# Patient Record
Sex: Male | Born: 1935 | Race: White | Hispanic: No | Marital: Married | Smoking: Former smoker
Health system: Southern US, Community
[De-identification: ages and names within clinical notes are randomized; demographics above are authoritative.]

## PROBLEM LIST (undated history)

## (undated) DIAGNOSIS — R413 Other amnesia: Secondary | ICD-10-CM

## (undated) DIAGNOSIS — N400 Enlarged prostate without lower urinary tract symptoms: Secondary | ICD-10-CM

## (undated) DIAGNOSIS — K648 Other hemorrhoids: Secondary | ICD-10-CM

## (undated) DIAGNOSIS — R51 Headache: Secondary | ICD-10-CM

## (undated) DIAGNOSIS — I499 Cardiac arrhythmia, unspecified: Secondary | ICD-10-CM

## (undated) DIAGNOSIS — G4730 Sleep apnea, unspecified: Secondary | ICD-10-CM

## (undated) DIAGNOSIS — D126 Benign neoplasm of colon, unspecified: Secondary | ICD-10-CM

## (undated) DIAGNOSIS — R519 Headache, unspecified: Secondary | ICD-10-CM

## (undated) DIAGNOSIS — I739 Peripheral vascular disease, unspecified: Secondary | ICD-10-CM

## (undated) DIAGNOSIS — K5790 Diverticulosis of intestine, part unspecified, without perforation or abscess without bleeding: Secondary | ICD-10-CM

## (undated) DIAGNOSIS — I10 Essential (primary) hypertension: Secondary | ICD-10-CM

## (undated) DIAGNOSIS — G2581 Restless legs syndrome: Secondary | ICD-10-CM

## (undated) HISTORY — DX: Other amnesia: R41.3

## (undated) HISTORY — DX: Benign neoplasm of colon, unspecified: D12.6

## (undated) HISTORY — DX: Benign prostatic hyperplasia without lower urinary tract symptoms: N40.0

## (undated) HISTORY — DX: Other hemorrhoids: K64.8

## (undated) HISTORY — PX: CATARACT EXTRACTION W/ INTRAOCULAR LENS  IMPLANT, BILATERAL: SHX1307

## (undated) HISTORY — DX: Restless legs syndrome: G25.81

## (undated) HISTORY — PX: APPENDECTOMY: SHX54

## (undated) HISTORY — DX: Diverticulosis of intestine, part unspecified, without perforation or abscess without bleeding: K57.90

---

## 1999-03-30 ENCOUNTER — Ambulatory Visit (HOSPITAL_COMMUNITY): Admission: RE | Admit: 1999-03-30 | Discharge: 1999-03-30 | Payer: Self-pay | Admitting: *Deleted

## 1999-03-30 HISTORY — PX: CARDIAC CATHETERIZATION: SHX172

## 1999-04-04 ENCOUNTER — Encounter: Payer: Self-pay | Admitting: *Deleted

## 1999-04-04 ENCOUNTER — Ambulatory Visit (HOSPITAL_COMMUNITY): Admission: RE | Admit: 1999-04-04 | Discharge: 1999-04-04 | Payer: Self-pay | Admitting: *Deleted

## 2001-12-26 ENCOUNTER — Encounter: Admission: RE | Admit: 2001-12-26 | Discharge: 2002-03-26 | Payer: Self-pay | Admitting: Internal Medicine

## 2006-10-15 ENCOUNTER — Ambulatory Visit: Payer: Self-pay | Admitting: Gastroenterology

## 2006-10-24 ENCOUNTER — Encounter: Payer: Self-pay | Admitting: Gastroenterology

## 2006-10-24 ENCOUNTER — Ambulatory Visit: Payer: Self-pay | Admitting: Gastroenterology

## 2006-10-24 DIAGNOSIS — D126 Benign neoplasm of colon, unspecified: Secondary | ICD-10-CM

## 2006-10-24 DIAGNOSIS — K648 Other hemorrhoids: Secondary | ICD-10-CM

## 2006-10-24 DIAGNOSIS — K5790 Diverticulosis of intestine, part unspecified, without perforation or abscess without bleeding: Secondary | ICD-10-CM

## 2006-10-24 HISTORY — DX: Benign neoplasm of colon, unspecified: D12.6

## 2006-10-24 HISTORY — DX: Other hemorrhoids: K64.8

## 2006-10-24 HISTORY — DX: Diverticulosis of intestine, part unspecified, without perforation or abscess without bleeding: K57.90

## 2008-02-21 HISTORY — PX: SHOULDER ARTHROSCOPY W/ ROTATOR CUFF REPAIR: SHX2400

## 2008-09-24 ENCOUNTER — Encounter: Admission: RE | Admit: 2008-09-24 | Discharge: 2008-09-24 | Payer: Self-pay | Admitting: Family Medicine

## 2008-11-10 ENCOUNTER — Encounter: Admission: RE | Admit: 2008-11-10 | Discharge: 2009-02-08 | Payer: Self-pay | Admitting: Orthopedic Surgery

## 2010-07-08 NOTE — Cardiovascular Report (Signed)
[facility]  Patient:    Carl Harper, Carl Harper                     MRN: [id number] Proc. Date: 03/30/99 Adm. Date:  [id number] Attending:  Meade Maw A CC:         Redmond Baseman, M.D.                        Cardiac Catheterization  PROCEDURE PERFORMED: 1. Left heart catheterization. 2. Coronary angiography. 3. Single plane ventriculogram.  REFERRING PHYSICIAN:  Dr. Modesto Charon.  INDICATION:  [name] is a 76 year old gentleman with multiple risk factors for coronary artery disease including male, age, tobacco abuse.  A stress Cardiolite was performed.  The SPECT images showed decreased perfusion of the stress and rest images on the inferior wall and decreased uptake in the anterior inferior apical which was felt to be partial redistribution.  The ejection fraction was 58%.  The patient had poor exercise tolerance, and only exercised for 5 minutes and 38 seconds.  Based on these findings it was decided to proceed with a left heart catheterization.  After obtaining written informed consent the patient was brought to the cardiac catheterization lab in the post ictal state.  Preoperative sedation was achieved using IV Versed, IV Fentanyl. The right femoral head was identified using radiographic technique.  A 6 French hemostasis sheath was placed into the right femoral artery using modified Seldinger technique.  Selective coronary angiography was performed using a JL5 and JR4 Judkins catheter.  Single plane ventriculogram was performed using a 6 French pigtail curved catheter.  All catheter exchanges were made over guidewire.  A hemostasis sheath was placed following each catheter exchange.  Following the  procedure the films were reviewed.  There was no identifiable coronary artery disease.  FINDINGS:  The LV pressure was 109/14 AO pressure was 116/66.  Single plane ventriculogram revealed normal wall motion.  There was post  PVC, and MR noted.  Coronary angiography of the left main coronary artery trifurcated into the left anterior descending ramus and circumflex vessel.  There was no significant disease in the left main coronary artery.  LEFT ANTERIOR DESCENDING:  Left anterior descending gave rise to a small D1, moderate D2, a small D3 and a distant apical recurrent branch.  There appears to be some calcium extrinsic to the proximal LAD but none actually intraluminal. There was luminal irregularities in the mid LAD.  RAMUS BRANCH:  The ramus branch ______ as a high obtuse marginal and trifurcated. It was a large vessel, most of the lateral portion of the ventricle.  CIRCUMFLEX VESSEL:  The circumflex vessel was dominant for the posterior circulation and gave rise to a moderate OM1, and no significant disease.  RIGHT CORONARY ARTERY:  The right coronary artery was small and nondominant and had luminal irregularities only.  IMPRESSION: 1. Luminal irregularities. 2. Normal LV gram. 3. Dilated aorta.  RECOMMENDATIONS:  Will be to consider other etiologies for his chest pain including a CT scan of the chest to evaluate the aorta. DD:  03/30/99 TD:  03/30/99 Job: [id number]

## 2010-09-19 DIAGNOSIS — N3943 Post-void dribbling: Secondary | ICD-10-CM | POA: Diagnosis present

## 2011-01-09 HISTORY — PX: OTHER SURGICAL HISTORY: SHX169

## 2011-09-13 ENCOUNTER — Encounter: Payer: Self-pay | Admitting: Gastroenterology

## 2011-10-05 ENCOUNTER — Encounter: Payer: Self-pay | Admitting: Gastroenterology

## 2011-10-25 ENCOUNTER — Encounter (HOSPITAL_COMMUNITY): Payer: Self-pay | Admitting: Respiratory Therapy

## 2011-10-26 ENCOUNTER — Other Ambulatory Visit: Payer: Self-pay | Admitting: Cardiovascular Disease

## 2011-10-31 ENCOUNTER — Telehealth: Payer: Self-pay | Admitting: Gastroenterology

## 2011-11-01 ENCOUNTER — Other Ambulatory Visit: Payer: Self-pay | Admitting: Cardiovascular Disease

## 2011-11-01 ENCOUNTER — Ambulatory Visit
Admission: RE | Admit: 2011-11-01 | Discharge: 2011-11-01 | Disposition: A | Discharge status: Home / Self Care | Payer: Medicare Other | Source: Ambulatory Visit | Attending: Cardiovascular Disease | Admitting: Cardiovascular Disease

## 2011-11-01 DIAGNOSIS — Z01818 Encounter for other preprocedural examination: Secondary | ICD-10-CM

## 2011-11-02 NOTE — Telephone Encounter (Signed)
error 

## 2011-11-03 ENCOUNTER — Other Ambulatory Visit: Payer: Self-pay | Admitting: Cardiovascular Disease

## 2011-11-06 ENCOUNTER — Ambulatory Visit
Admission: RE | Admit: 2011-11-06 | Discharge: 2011-11-06 | Disposition: A | Discharge status: Home / Self Care | Payer: Medicare Other | Source: Ambulatory Visit | Attending: Cardiovascular Disease | Admitting: Cardiovascular Disease

## 2011-11-07 ENCOUNTER — Ambulatory Visit (HOSPITAL_COMMUNITY)
Admission: RE | Admit: 2011-11-07 | Discharge: 2011-11-08 | Disposition: A | Discharge status: Home / Self Care | Payer: Medicare Other | Source: Ambulatory Visit | Attending: Cardiovascular Disease | Admitting: Cardiovascular Disease

## 2011-11-07 ENCOUNTER — Encounter (HOSPITAL_COMMUNITY)
Admission: RE | Disposition: A | Discharge status: Home / Self Care | Payer: Self-pay | Source: Ambulatory Visit | Attending: Cardiovascular Disease

## 2011-11-07 ENCOUNTER — Encounter (HOSPITAL_COMMUNITY): Payer: Self-pay | Admitting: General Practice

## 2011-11-07 DIAGNOSIS — I10 Essential (primary) hypertension: Secondary | ICD-10-CM | POA: Insufficient documentation

## 2011-11-07 DIAGNOSIS — I739 Peripheral vascular disease, unspecified: Secondary | ICD-10-CM | POA: Diagnosis present

## 2011-11-07 DIAGNOSIS — I70219 Atherosclerosis of native arteries of extremities with intermittent claudication, unspecified extremity: Secondary | ICD-10-CM | POA: Insufficient documentation

## 2011-11-07 HISTORY — PX: ILIAC ARTERY STENT: SHX1786

## 2011-11-07 HISTORY — DX: Headache: R51

## 2011-11-07 HISTORY — DX: Peripheral vascular disease, unspecified: I73.9

## 2011-11-07 HISTORY — DX: Essential (primary) hypertension: I10

## 2011-11-07 HISTORY — PX: ATHERECTOMY: SHX5502

## 2011-11-07 HISTORY — DX: Sleep apnea, unspecified: G47.30

## 2011-11-07 HISTORY — PX: LOWER EXTREMITY ANGIOGRAM: SHX5508

## 2011-11-07 HISTORY — DX: Headache, unspecified: R51.9

## 2011-11-07 HISTORY — DX: Cardiac arrhythmia, unspecified: I49.9

## 2011-11-07 HISTORY — PX: PERCUTANEOUS STENT INTERVENTION: SHX5500

## 2011-11-07 LAB — POCT ACTIVATED CLOTTING TIME
Activated Clotting Time: 194 seconds
Activated Clotting Time: 204 seconds
Activated Clotting Time: 174 seconds

## 2011-11-07 SURGERY — ANGIOGRAM, LOWER EXTREMITY
Anesthesia: LOCAL | Laterality: Right

## 2011-11-07 MED ORDER — ASPIRIN 81 MG PO TABS: 81.0000 mg | ORAL_TABLET | Freq: Every day | ORAL | Status: DC

## 2011-11-07 MED ORDER — ASPIRIN 81 MG PO CHEW
324.0000 mg | CHEWABLE_TABLET | ORAL | Status: AC
  Administered 2011-11-07: 324 mg via ORAL
  Filled 2011-11-07: qty 4

## 2011-11-07 MED ORDER — ZOLPIDEM TARTRATE 5 MG PO TABS
5.0000 mg | ORAL_TABLET | Freq: Every day | ORAL | Status: DC
  Administered 2011-11-07: 5 mg via ORAL
  Filled 2011-11-07: qty 1

## 2011-11-07 MED ORDER — METOPROLOL SUCCINATE ER 25 MG PO TB24
25.0000 mg | ORAL_TABLET | Freq: Every day | ORAL | Status: DC
  Filled 2011-11-07 (×2): qty 1

## 2011-11-07 MED ORDER — LOSARTAN POTASSIUM 50 MG PO TABS
50.0000 mg | ORAL_TABLET | Freq: Every day | ORAL | Status: DC
  Filled 2011-11-07 (×2): qty 1

## 2011-11-07 MED ORDER — SODIUM CHLORIDE 0.9 % IV SOLN
INTRAVENOUS | Status: AC
  Administered 2011-11-07: 22:00:00 via INTRAVENOUS

## 2011-11-07 MED ORDER — HEPARIN (PORCINE) IN NACL 2-0.9 UNIT/ML-% IJ SOLN
INTRAMUSCULAR | Status: AC
  Filled 2011-11-07: qty 500

## 2011-11-07 MED ORDER — LIDOCAINE HCL (PF) 1 % IJ SOLN
INTRAMUSCULAR | Status: AC
  Filled 2011-11-07: qty 30

## 2011-11-07 MED ORDER — TAMSULOSIN HCL 0.4 MG PO CAPS
0.4000 mg | ORAL_CAPSULE | Freq: Every day | ORAL | Status: DC
  Filled 2011-11-07 (×2): qty 1

## 2011-11-07 MED ORDER — DIAZEPAM 5 MG PO TABS
5.0000 mg | ORAL_TABLET | ORAL | Status: AC
  Administered 2011-11-07: 5 mg via ORAL
  Filled 2011-11-07: qty 1

## 2011-11-07 MED ORDER — MORPHINE SULFATE 2 MG/ML IJ SOLN: 1.0000 mg | INTRAMUSCULAR | Status: DC | PRN

## 2011-11-07 MED ORDER — ONDANSETRON HCL 4 MG/2ML IJ SOLN: 4.0000 mg | Freq: Four times a day (QID) | INTRAMUSCULAR | Status: DC | PRN

## 2011-11-07 MED ORDER — VERAPAMIL HCL 2.5 MG/ML IV SOLN
INTRAVENOUS | Status: AC
  Filled 2011-11-07: qty 2

## 2011-11-07 MED ORDER — HEPARIN SODIUM (PORCINE) 1000 UNIT/ML IJ SOLN
INTRAMUSCULAR | Status: AC
  Filled 2011-11-07: qty 1

## 2011-11-07 MED ORDER — SODIUM CHLORIDE 0.9 % IV SOLN: INTRAVENOUS | Status: DC

## 2011-11-07 MED ORDER — ACETAMINOPHEN 325 MG PO TABS: 650.0000 mg | ORAL_TABLET | ORAL | Status: DC | PRN

## 2011-11-07 MED ORDER — CLOPIDOGREL BISULFATE 75 MG PO TABS
75.0000 mg | ORAL_TABLET | Freq: Every day | ORAL | Status: DC
  Administered 2011-11-08: 09:00:00 75 mg via ORAL
  Filled 2011-11-07: qty 1

## 2011-11-07 MED ORDER — ASPIRIN EC 325 MG PO TBEC
325.0000 mg | DELAYED_RELEASE_TABLET | Freq: Every day | ORAL | Status: DC
  Filled 2011-11-07: qty 1

## 2011-11-07 MED ORDER — CLOPIDOGREL BISULFATE 300 MG PO TABS
ORAL_TABLET | ORAL | Status: AC
  Filled 2011-11-07: qty 1

## 2011-11-07 MED ORDER — ALPRAZOLAM 0.25 MG PO TABS: 0.5000 mg | ORAL_TABLET | Freq: Every evening | ORAL | Status: DC | PRN

## 2011-11-07 MED ORDER — PRAMIPEXOLE DIHYDROCHLORIDE 0.25 MG PO TABS
0.2500 mg | ORAL_TABLET | Freq: Every day | ORAL | Status: DC
  Filled 2011-11-07 (×2): qty 1

## 2011-11-07 NOTE — Progress Notes (Signed)
Patient called to nurse, bleeding from right groin site. Sitting and eating lunch at the time and felt wet. Pressure held for 20 minutes by Tammy Mink-Hayes and pressure dressing applied. Instructions repeated regarding pressure at groin with coughing or sneezing. Patient noted to be coughing when pressure held. Palpable pulse right dorsalis pedas.

## 2011-11-07 NOTE — Progress Notes (Signed)
Site area: right groin  Site Prior to Removal:  Level 0  Pressure Applied For 20 MINUTES    Minutes Beginning at 1505  Manual:   yes  Patient Status During Pull:  stable  Post Pull Groin Site:  Level 0  Post Pull Instructions Given:  yes  Post Pull Pulses Present:  yes  Dressing Applied:  yes  Comments:  Gauze dressing secured with medipore tape

## 2011-11-07 NOTE — H&P (Signed)
  H & P will be scanned in.  Pt was reexamined and existing H & P reviewed. No changes found.  Runell Gess, MD [facility] 11/07/2011 1:33 PM

## 2011-11-07 NOTE — Op Note (Signed)
Carl Harper is a 77 y.o. male    [id number] LOCATION:  FACILITY: [facility]  PHYSICIAN: Nanetta Batty, M.D. 1934-06-19   DATE OF PROCEDURE:  11/07/2011  DATE OF DISCHARGE:  [facility]  PV Intervention    History obtained from chart review. Carl Harper is a 77 year old married Caucasian male referred by Dr. Susa Griffins for peripheral angiography and potential percutaneous intervention for lifestyle limiting claudication. His problems include treated hypertension. He has no significant CAD. By cath remotely. Dopplers in our office revealed a high-frequency signal at the origin of his right common iliac artery.   PROCEDURE DESCRIPTION:    The patient was brought to the second floor  [place] [facility] lab in the postabsorptive state. He was  premedicated with Valium 5 mg by mouth. His right groin was prepped and shaved in usual sterile fashion. Xylocaine 1% was used  for local anesthesia. A 5 French sheath was inserted into the right common  artery using standard Seldinger technique. The patient received  7500 units  of heparin  intravenously.  A 5 French pigtail catheter was used for abdominal aortography bifemoral runoff using bolus chase digital subtraction step table technique. Visipaque dye was used for the entirety of the case. Retrograde aortic pressure was monitored during the case.    HEMODYNAMICS:    AO SYSTOLIC/AO DIASTOLIC: 145/74   ANGIOGRAPHIC RESULTS:   1: Abdominal aortogram-renal arteries widely patent. The intrarenal double aorta was free of significant atherosclerotic disease.  2: Right lower extremity-80% eccentric exophytic calcified plaque near the origin of the right common iliac artery. There was three-vessel runoff.  3: Left lower extremity-normal three-vessel runoff    IMPRESSION:Carl Harper has a high-grade calcified exophytic plaque near the origin of his right common iliac artery. A 5 French angled  catheter was placed across the lesion and 200 mcg of intra-arterial nitroglycerin was administered demonstrating a 55 mm of mercury pullback gradient. Based on this it was decided to proceed with diamondback orbital rotational atherectomy plus or minus PTA and stenting.  Procedure description: The 5 French sheath was exchanged over wire for a 7 French Bright tip sheath. Through a 5 [place] end call catheter a Viper wire was placed in the suprarenal aorta. Using a 2.5 mm burr was used to perform orbital rotation arthrectomy up to 120,000 rpm's. Angiography revealed an excellent result with some mild residual stenosis. Because of this stenting was performed with a 12 mm in diameter by 4 cm long Smart Nitinol self-expanding stent placed just at the origin of the right common iliac artery and post dilated with a 9 mm x 2 cm balloon at 6 atmospheres resulting in reduction of 80% calcific exophytic plaque in the proximal right common iliac artery to 0% residual without dissection. The patient tolerated the procedure well. The guidewire was removed and the sheath was secured. The sheath will be removed once the ACT falls below 170 pressure will be held on the groin to achieve hemostasis. The patient received 300 mg of by mouth Plavix during the case. He'll be discharged home in the morning and obtain followup Dopplers in our office after chills he is back for a return office visit.   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  Runell Gess MD, [facility] 11/07/2011 2:37 PM

## 2011-11-08 DIAGNOSIS — I739 Peripheral vascular disease, unspecified: Secondary | ICD-10-CM | POA: Diagnosis present

## 2011-11-08 DIAGNOSIS — I10 Essential (primary) hypertension: Secondary | ICD-10-CM | POA: Diagnosis present

## 2011-11-08 LAB — CBC
MCH: 30.2 pg (ref 26.0–34.0)
MCHC: 33.9 g/dL (ref 30.0–36.0)
Platelets: 215 10*3/uL (ref 150–400)
RBC: 4.43 MIL/uL (ref 4.22–5.81)

## 2011-11-08 LAB — BASIC METABOLIC PANEL
BUN: 16 mg/dL (ref 6–23)
Calcium: 8.9 mg/dL (ref 8.4–10.5)
GFR calc Af Amer: 75 mL/min — ABNORMAL LOW (ref >90)
GFR calc non Af Amer: 64 mL/min — ABNORMAL LOW (ref >90)
Glucose, Bld: 107 mg/dL — ABNORMAL HIGH (ref 70–99)
Sodium: 141 mEq/L (ref 135–145)

## 2011-11-08 MED ORDER — ASPIRIN 325 MG PO TBEC: 325.0000 mg | DELAYED_RELEASE_TABLET | Freq: Every day | ORAL | Status: DC

## 2011-11-08 MED ORDER — CLOPIDOGREL BISULFATE 75 MG PO TABS: 75.0000 mg | ORAL_TABLET | Freq: Every day | ORAL | Status: DC

## 2011-11-08 NOTE — Discharge Summary (Signed)
Physician Discharge Summary  Patient ID: ESAW DIEL MRN: [id number] DOB/AGE: [birth year] 77 y.o.  Admit date: 11/07/2011 Discharge date: 11/08/2011  Admission Diagnoses:  Lifestyle limiting claudication  Discharge Diagnoses:  Active Problems:  PAD (peripheral artery disease)  HTN (hypertension)  Claudication   Discharged Condition: stable  Hospital Course:   [name] is a 77 year old married Caucasian male referred by Dr. Susa Griffins for peripheral angiography and potential percutaneous intervention for lifestyle limiting claudication. His problems include treated hypertension. He has no significant CAD by cath remotely. Dopplers in our office revealed a high-frequency signal at the origin of his right common iliac artery.  He present for PV angiogram which resulted in successful diamondback orbital rotational atherectomy, PTA and stenting of a calcified exophytic proximal right common iliac artery stenosis.  The patient was seen by Dr. Allyson Sabal and felt to be stable for DC home.  Follow-up with Dr. Alanda Amass and lower extremity doppelrs.   Consults: None  Significant Diagnostic Studies:  PV angiogram and stenting PROCEDURE DESCRIPTION:  The patient was brought to the second floor  [place] [facility] lab in the postabsorptive state. He was  premedicated with Valium 5 mg by mouth. His right groin  was prepped and shaved in usual sterile fashion. Xylocaine 1% was used  for local anesthesia. A 5 French sheath was inserted into the right common  artery using standard Seldinger technique. The patient received  7500 units of heparin intravenously. A 5 French pigtail catheter was used for abdominal aortography bifemoral runoff using bolus chase digital subtraction step table technique. Visipaque dye was used for the entirety of the case. Retrograde aortic pressure was monitored during the case.  HEMODYNAMICS:  AO SYSTOLIC/AO DIASTOLIC: 145/74  ANGIOGRAPHIC RESULTS:    1: Abdominal aortogram-renal arteries widely patent. The intrarenal double aorta was free of significant atherosclerotic disease.  2: Right lower extremity-80% eccentric exophytic calcified plaque near the origin of the right common iliac artery. There was three-vessel runoff.  3: Left lower extremity-normal three-vessel runoff  IMPRESSION:Carl Harper has a high-grade calcified exophytic plaque near the origin of his right common iliac artery. A 5 French angled catheter was placed across the lesion and 200 mcg of intra-arterial nitroglycerin was administered demonstrating a 55 mm of mercury pullback gradient. Based on this it was decided to proceed with diamondback orbital rotational atherectomy plus or minus PTA and stenting.  Procedure description: The 5 French sheath was exchanged over wire for a 7 French Bright tip sheath. Through a 5 [place] end call catheter a Viper wire was placed in the suprarenal aorta. Using a 2.5 mm burr was used to perform orbital rotation arthrectomy up to 120,000 rpm's. Angiography revealed an excellent result with some mild residual stenosis. Because of this stenting was performed with a 12 mm in diameter by 4 cm long Smart Nitinol self-expanding stent placed just at the origin of the right common iliac artery and post dilated with a 9 mm x 2 cm balloon at 6 atmospheres resulting in reduction of 80% calcific exophytic plaque in the proximal right common iliac artery to 0% residual without dissection. The patient tolerated the procedure well. The guidewire was removed and the sheath was secured. The sheath will be removed once the ACT falls below 170 pressure will be held on the groin to achieve hemostasis. The patient received 300 mg of by mouth Plavix during the case. He'll be discharged home in the morning and obtain followup Dopplers in our office after chills he  is back for a return office visit.  Final impression: Successful diamondback orbital rotational atherectomy, PTA  and stenting of a calcified exophytic proximal right common iliac artery stenosis for lifestyle limiting claudication.  Runell Gess MD, [facility]  BMET    Component Value Date/Time   NA 141 11/08/2011 0420   K 3.6 11/08/2011 0420   CL 107 11/08/2011 0420   CO2 25 11/08/2011 0420   GLUCOSE 107* 11/08/2011 0420   BUN 16 11/08/2011 0420   CREATININE 1.09 11/08/2011 0420   CALCIUM 8.9 11/08/2011 0420   GFRNONAA 64* 11/08/2011 0420   GFRAA 75* 11/08/2011 0420   CBC    Component Value Date/Time   WBC 7.9 11/08/2011 0420   RBC 4.43 11/08/2011 0420   HGB 13.4 11/08/2011 0420   HCT 39.5 11/08/2011 0420   PLT 215 11/08/2011 0420   MCV 89.2 11/08/2011 0420   MCH 30.2 11/08/2011 0420   MCHC 33.9 11/08/2011 0420   RDW 12.8 11/08/2011 0420   Treatments: See procedure note.  Discharge Exam: Blood pressure 130/71, pulse 59, temperature 97.5 F (36.4 C), temperature source Oral, resp. rate 18, height 5\' 10"  (1.778 m), weight 81.6 kg (179 lb 14.3 oz), SpO2 96.00%.   Disposition:       Discharge Orders    Future Appointments: Provider: Department: Dept Phone: Center:   11/13/2011 1:30 PM [facility] [phone] [facility]   11/27/2011 10:00 AM Meryl Dare, MD,FACG [facility] [phone] [facility]     Future Orders Please Complete By Expires   Diet - low sodium heart healthy      Increase activity slowly      Discharge instructions      Comments:   No Lifting more than a gallon of milk or driving for three days.   Call MD for:  redness, tenderness, or signs of infection (pain, swelling, redness, odor or green/yellow discharge around incision site)          Medication List     As of 11/08/2011 11:30 AM    STOP taking these medications         aspirin 81 MG tablet      TAKE these medications         ALPRAZolam 0.5 MG tablet   Commonly known as: XANAX   Take 0.5 mg by mouth at bedtime as needed. For sleep      aspirin 325 MG EC tablet   Take  1 tablet (325 mg total) by mouth daily.      clopidogrel 75 MG tablet   Commonly known as: PLAVIX   Take 1 tablet (75 mg total) by mouth daily with breakfast.      losartan 50 MG tablet   Commonly known as: COZAAR   Take 50 mg by mouth daily.      metoprolol succinate 25 MG 24 hr tablet   Commonly known as: TOPROL-XL   Take 25 mg by mouth daily.      pramipexole 0.25 MG tablet   Commonly known as: MIRAPEX   Take 0.25 mg by mouth daily.      Tamsulosin HCl 0.4 MG Caps   Commonly known as: FLOMAX   Take 0.4 mg by mouth daily after supper.      zolpidem 12.5 MG CR tablet   Commonly known as: AMBIEN CR   Take 12.5 mg by mouth at bedtime as needed. For sleep        Follow-up Information    Follow up  with Governor Rooks, MD. (Our office will call with appt dates and times.)    Contact information:   [address]  [place] [address] [phone]          Signed: Wilburt Harper 11/08/2011, 11:30 AM

## 2011-11-08 NOTE — Progress Notes (Signed)
[facility]  Subjective: No complaints.  Objective: Vital signs in last 24 hours: Temp:  [97.5 F (36.4 C)-98.5 F (36.9 C)] 97.5 F (36.4 C) (09/18 0822) Pulse Rate:  [37-142] 59  (09/18 0822) Resp:  [11-23] 18  (09/18 0822) BP: (130-162)/(51-84) 130/71 mmHg (09/18 0822) SpO2:  [95 %-100 %] 96 % (09/18 0822) Weight:  [81.6 kg (179 lb 14.3 oz)] 81.6 kg (179 lb 14.3 oz) (09/18 0015)    Intake/Output from previous day: 09/17 0701 - 09/18 0700 In: 765 [P.O.:240; I.V.:525] Out: 2025 [Urine:2025] Intake/Output this shift:    Medications Current Facility-Administered Medications  Medication Dose Route Frequency Provider Last Rate Last Dose  . 0.9 %  sodium chloride infusion   Intravenous Continuous Carl Gess, MD      . acetaminophen (TYLENOL) tablet 650 mg  650 mg Oral Q4H PRN Carl Gess, MD      . ALPRAZolam Prudy Feeler) tablet 0.5 mg  0.5 mg Oral QHS PRN Carl Gess, MD      . aspirin chewable tablet 324 mg  324 mg Oral Pre-Cath Carl Gess, MD   324 mg at 11/07/11 0931  . aspirin EC tablet 325 mg  325 mg Oral Daily Carl Gess, MD      . clopidogrel (PLAVIX) 300 MG tablet           . clopidogrel (PLAVIX) tablet 75 mg  75 mg Oral Q breakfast Carl Gess, MD   75 mg at 11/08/11 0850  . heparin 1000 UNIT/ML injection           . heparin 2-0.9 UNIT/ML-% infusion           . lidocaine (XYLOCAINE) 1 % injection           . losartan (COZAAR) tablet 50 mg  50 mg Oral Daily Carl Gess, MD      . metoprolol succinate (TOPROL-XL) 24 hr tablet 25 mg  25 mg Oral Daily Carl Gess, MD      . morphine 2 MG/ML injection 1 mg  1 mg Intravenous Q1H PRN Carl Gess, MD      . ondansetron [facility]) injection 4 mg  4 mg Intravenous Q6H PRN Carl Gess, MD      . pramipexole (MIRAPEX) tablet 0.25 mg  0.25 mg Oral Daily Carl Gess, MD      . Tamsulosin HCl (FLOMAX) capsule 0.4 mg  0.4 mg Oral QPC supper Carl Gess, MD      . verapamil (ISOPTIN) 2.5 MG/ML injection           . zolpidem (AMBIEN) tablet 5 mg  5 mg Oral QHS Carl Gess, MD   5 mg at 11/07/11 2200  . DISCONTD: 0.9 %  sodium chloride infusion   Intravenous Continuous Carl Gess, MD      . DISCONTD: aspirin tablet 81 mg  81 mg Oral Daily Carl Gess, MD        PE: General appearance: alert, cooperative and no distress Lungs: clear to auscultation bilaterally Heart: regular rate and rhythm, S1, S2 normal, no murmur, click, rub or gallop Extremities: No LEE Pulses: 2+ and symmetric 1+ left DP, 2+ left DP Neurologic: Grossly normal right groin is nontender, no hematoma or ecchymosis.  Lab Results:   [facility] 11/08/11 0420  WBC 7.9  HGB 13.4  HCT 39.5  PLT 215   BMET  Basename 11/08/11 0420  NA 141  K 3.6  CL 107  CO2 25  GLUCOSE 107*  BUN 16  CREATININE 1.09  CALCIUM 8.9   Studies/Results: PROCEDURE DESCRIPTION:  The patient was brought to the second floor  [place] [facility] cath lab in the postabsorptive state. He was  premedicated with Valium 5 mg by mouth. His right groin  was prepped and shaved in usual sterile fashion. Xylocaine 1% was used  for local anesthesia. A 5 French sheath was inserted into the right common  artery using standard Seldinger technique. The patient received  7500 units of heparin intravenously. A 5 French pigtail catheter was used for abdominal aortography bifemoral runoff using bolus chase digital subtraction step table technique. Visipaque dye was used for the entirety of the case. Retrograde aortic pressure was monitored during the case.  HEMODYNAMICS:  AO SYSTOLIC/AO DIASTOLIC: 145/74  ANGIOGRAPHIC RESULTS:  1: Abdominal aortogram-renal arteries widely patent. The intrarenal double aorta was free of significant atherosclerotic disease.  2: Right lower extremity-80% eccentric exophytic calcified plaque near the origin of the right common iliac artery. There was  three-vessel runoff.  3: Left lower extremity-normal three-vessel runoff  IMPRESSION:Carl Harper has a high-grade calcified exophytic plaque near the origin of his right common iliac artery. A 5 French angled catheter was placed across the lesion and 200 mcg of intra-arterial nitroglycerin was administered demonstrating a 55 mm of mercury pullback gradient. Based on this it was decided to proceed with diamondback orbital rotational atherectomy plus or minus PTA and stenting.  Procedure description: The 5 French sheath was exchanged over wire for a 7 French Bright tip sheath. Through a 5 [place] end call catheter a Viper wire was placed in the suprarenal aorta. Using a 2.5 mm burr was used to perform orbital rotation arthrectomy up to 120,000 rpm's. Angiography revealed an excellent result with some mild residual stenosis. Because of this stenting was performed with a 12 mm in diameter by 4 cm long Smart Nitinol self-expanding stent placed just at the origin of the right common iliac artery and post dilated with a 9 mm x 2 cm balloon at 6 atmospheres resulting in reduction of 80% calcific exophytic plaque in the proximal right common iliac artery to 0% residual without dissection. The patient tolerated the procedure well. The guidewire was removed and the sheath was secured. The sheath will be removed once the ACT falls below 170 pressure will be held on the groin to achieve hemostasis. The patient received 300 mg of by mouth Plavix during the case. He'll be discharged home in the morning and obtain followup Dopplers in our office after chills he is back for a return office visit.  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  Carl Gess MD, [facility]    Assessment/Plan  Active Problems:  PAD (peripheral artery disease)  HTN (hypertension)  Plan:  S/P  Successful diamondback orbital rotational atherectomy, PTA and stenting of a calcified exophytic proximal right common iliac artery stenosis.  Vital signs are stable.  Follow up [facility] dopplers and with Dr. Alanda Amass.    LOS: 1 day    Harper, Carl 11/08/2011 10:58 AM   Agree with note written by Jones Skene PAC  Groin OK. S/P DB atherectomy, PTA/Stent RCIA. OK for D/C home. [facility] then ROV with me 1-2 weeks.  Carl Harper 11/08/2011 12:04 PM

## 2011-11-27 ENCOUNTER — Encounter: Payer: Self-pay | Admitting: Gastroenterology

## 2012-02-27 ENCOUNTER — Encounter: Payer: Self-pay | Admitting: *Deleted

## 2012-02-28 ENCOUNTER — Ambulatory Visit (INDEPENDENT_AMBULATORY_CARE_PROVIDER_SITE_OTHER): Payer: Medicare Other | Admitting: Physician Assistant

## 2012-02-28 ENCOUNTER — Encounter: Payer: Self-pay | Admitting: Physician Assistant

## 2012-02-28 VITALS — BP 124/60 | HR 76 | Ht 67.75 in | Wt 180.5 lb

## 2012-02-28 DIAGNOSIS — Z860101 Personal history of adenomatous and serrated colon polyps: Secondary | ICD-10-CM

## 2012-02-28 DIAGNOSIS — Z7901 Long term (current) use of anticoagulants: Secondary | ICD-10-CM

## 2012-02-28 DIAGNOSIS — Z86010 Personal history of colonic polyps: Secondary | ICD-10-CM

## 2012-02-28 MED ORDER — MOVIPREP 100 G PO SOLR: 1.0000 | Freq: Once | ORAL | Status: AC

## 2012-02-28 NOTE — Patient Instructions (Addendum)
We sent a prescription to [facility] for the Moviprep, the prep you will drink before the colonsocopy.  We will contact you once we hear from Dr. Nanetta Batty at [facility] regarding the Plavix medication.  You have been scheduled for a colonoscopy with propofol. Please follow written instructions given to you at your visit today.  Please pick up your prep kit at the pharmacy within the next 1-3 days. If you use inhalers (even only as needed) or a CPAP machine, please bring them with you on the day of your procedure.

## 2012-02-28 NOTE — Progress Notes (Signed)
Subjective:    Patient ID: Carl Harper, male    DOB: November 04, 1934, 78 y.o.   MRN: [id number]  HPI Carl Harper is a pleasant 78 year old white male referred by his primary care physician, Dr. Everlene Other to consider followup colonoscopy. Patient is known to Dr. Damita Lack and had last colonoscopy done in September of 2008. At that time he was found to have 2 polyps 4 mm or less left-sided diverticulosis and internal hemorrhoids. Path on the polyps one was adenomatous, one hyperplastic. He was recommended for 5 year followup. He currently has no significant GI complaints, he says he does have chronic problems with constipation and uses MiraLax 2 or 3 times weekly and says that this works fine. He has not noted any changes in his bowel habits no melena or hematochezia and denies any abdominal pain. He does have history of claudication and underwent an atherectomy, PTA and stent placement to the right common iliac artery in September of 2013 her Dr. Allyson Sabal. He is now on a  baby aspirin and Plavix.    Review of Systems  Constitutional: Negative.   HENT: Negative.   Eyes: Negative.   Respiratory: Negative.   Gastrointestinal: Positive for constipation.  Genitourinary: Negative.   Musculoskeletal: Positive for gait problem.  Skin: Negative.   Neurological: Negative.   Hematological: Bruises/bleeds easily.  Psychiatric/Behavioral: Negative.    Outpatient Prescriptions Prior to Visit  Medication Sig Dispense Refill  . clopidogrel (PLAVIX) 75 MG tablet Take 1 tablet (75 mg total) by mouth daily with breakfast.  30 tablet  11  . losartan (COZAAR) 50 MG tablet Take 50 mg by mouth daily.      . pramipexole (MIRAPEX) 0.25 MG tablet Take 0.25 mg by mouth as needed.       . Tamsulosin HCl (FLOMAX) 0.4 MG CAPS Take 0.4 mg by mouth daily after supper.      . zolpidem (AMBIEN CR) 12.5 MG CR tablet Take 12.5 mg by mouth at bedtime as needed. For sleep      . [DISCONTINUED] ALPRAZolam (XANAX) 0.5 MG tablet Take 0.5 mg by  mouth at bedtime as needed. For sleep      . [DISCONTINUED] aspirin EC 325 MG EC tablet Take 1 tablet (325 mg total) by mouth daily.  30 tablet    . [DISCONTINUED] metoprolol succinate (TOPROL-XL) 25 MG 24 hr tablet Take 25 mg by mouth daily.       Last reviewed on 02/28/2012  2:09 PM by Sammuel Cooper, PA     No Known Allergies Patient Active Problem List  Diagnosis  . PAD (peripheral artery disease)  . HTN (hypertension)  . Claudication  . Hx of adenomatous colonic polyps   History  Substance Use Topics  . Smoking status: Former Smoker -- 1.0 packs/day for 55 years    Types: Cigarettes    Quit date: 02/20/2006  . Smokeless tobacco: Never Used  . Alcohol Use: 8.4 oz/week    14 Glasses of wine per week     Comment: 11/07/2011 "couple drinks of wine q night"   History reviewed. No pertinent family history.  Objective:   Physical Exam well-developed older white male in no acute distress, pleasant blood pressure 124/60 pulse 76 height 5 foot 7 weight 180. HEENT; nontraumatic normocephalic EOMI PERRLA sclera anicteric,Neck; Supple no JVD, Cardiovascular; regular rate and rhythm with S1-S2 no murmur or gallop, Pulmonary; clear bilaterally, Abdomen; soft nontender nondistended no palpable mass or hepatosplenomegaly bowel sounds are present, Rectal; exam not done, Extremities;  no clubbing cyanosis or edema skin warm and dry, Psych; mood and affect normal and appropriate.        Assessment & Plan:  #16 78 year old male with history of adenomatous colon polyp; last colonoscopy September 2008, and due for followup. #2 recent right common iliac atherectomy and stent placement-on chronic Plavix and aspirin #3 hypertension  Plan; Patient is due for followup colonoscopy and will tentatively schedule with Dr. Russella Dar. Procedure reviewed in detail with the patient and he is agreeable to proceed. Given that he had a recent stent placement cardiology may prefer for him to wait one year prior to  coming off of Plavix, and this was discussed with patient as well. We will obtain recommendation from Dr. Allyson Sabal, and then plan followup colonoscopy accordingly.

## 2012-02-28 NOTE — Progress Notes (Signed)
Reviewed and agree with management plan. Await Dr. Hazle Coca recommendation. If Dr. Allyson Sabal recommends to wait until after 1 full year of Plavix before holding for 5 days for this elective surveillance colonoscopy, will reschedule for 11/2012.   Venita Lick. Russella Dar, MD [facility]

## 2012-02-29 ENCOUNTER — Telehealth: Payer: Self-pay | Admitting: *Deleted

## 2012-02-29 NOTE — Telephone Encounter (Signed)
Called and spoke to [place] [facility]Coumadin clinic there.  She said she takes care of coumadin patients only but would be glad to look for this fax I sent and see that Dr. Alanda Amass gets it.

## 2012-02-29 NOTE — Telephone Encounter (Signed)
I faxed a Plavix letter to [facility] attention to Dr. Nanetta Batty. My l etter also included the patient had an Iliac stent place in September 2013.  I was told he is actually taken care of by Dr. Alanda Amass.  I called today the 9th and spoke to [facility] and she will see that Dr. Alanda Amass gets my fax. I advised I need it back in a reasonable amount of time before the procedure so I can give the patient their medication instructions/Plavix.

## 2012-03-13 ENCOUNTER — Telehealth: Payer: Self-pay | Admitting: *Deleted

## 2012-03-13 NOTE — Telephone Encounter (Signed)
I called and spoke to the patient's wife, [name].  I advised her that her husband's colonoscopy procedure is scheduled for 04-11-2012.  I advised we heard from [facility] , Dr. Alanda Amass said that Carl Harper, the patient, can hold the Plavix for 5 days prior to the procedure, (on 2-15 ) and resume it the day after the procedure ( 04-12-2012).  She understood this and will convey this information to her husband.  I told her if they have any questions regarding the Plavix to call [facility] and speak to his cardiologist.

## 2012-04-11 ENCOUNTER — Ambulatory Visit (AMBULATORY_SURGERY_CENTER): Payer: Medicare Other | Admitting: Gastroenterology

## 2012-04-11 ENCOUNTER — Encounter: Payer: Self-pay | Admitting: Gastroenterology

## 2012-04-11 VITALS — BP 115/54 | HR 56 | Temp 96.5°F | Resp 16 | Ht 67.0 in | Wt 180.0 lb

## 2012-04-11 DIAGNOSIS — Z1211 Encounter for screening for malignant neoplasm of colon: Secondary | ICD-10-CM

## 2012-04-11 DIAGNOSIS — Z860100 Personal history of colon polyps, unspecified: Secondary | ICD-10-CM

## 2012-04-11 MED ORDER — SODIUM CHLORIDE 0.9 % IV SOLN: 500.0000 mL | INTRAVENOUS | Status: DC

## 2012-04-11 NOTE — Patient Instructions (Addendum)
Findings:  Diverticulosis, Internal Hemorrhoids Recommendations:  High Fiber Diet and liberal fluid intake, Resume plavix today  YOU HAD AN ENDOSCOPIC PROCEDURE TODAY AT THE [facility]: Refer to the procedure report that was given to you for any specific questions about what was found during the examination.  If the procedure report does not answer your questions, please call your gastroenterologist to clarify.  If you requested that your care partner not be given the details of your procedure findings, then the procedure report has been included in a sealed envelope for you to review at your convenience later.  YOU SHOULD EXPECT: Some feelings of bloating in the abdomen. Passage of more gas than usual.  Walking can help get rid of the air that was put into your GI tract during the procedure and reduce the bloating. If you had a lower endoscopy (such as a colonoscopy or flexible sigmoidoscopy) you may notice spotting of blood in your stool or on the toilet paper. If you underwent a bowel prep for your procedure, then you may not have a normal bowel movement for a few days.  DIET: Your first meal following the procedure should be a light meal and then it is ok to progress to your normal diet.  A half-sandwich or bowl of soup is an example of a good first meal.  Heavy or fried foods are harder to digest and may make you feel nauseous or bloated.  Likewise meals heavy in dairy and vegetables can cause extra gas to form and this can also increase the bloating.  Drink plenty of fluids but you should avoid alcoholic beverages for 24 hours.  ACTIVITY: Your care partner should take you home directly after the procedure.  You should plan to take it easy, moving slowly for the rest of the day.  You can resume normal activity the day after the procedure however you should NOT DRIVE or use heavy machinery for 24 hours (because of the sedation medicines used during the test).    SYMPTOMS TO REPORT  IMMEDIATELY: A gastroenterologist can be reached at any hour.  During normal business hours, 8:30 AM to 5:00 PM Monday through Friday, call [phone].  After hours and on weekends, please call the GI answering service at [phone] who will take a message and have the physician on call contact you.   Following lower endoscopy (colonoscopy or flexible sigmoidoscopy):  Excessive amounts of blood in the stool  Significant tenderness or worsening of abdominal pains  Swelling of the abdomen that is new, acute  Fever of 100F or higher  Following upper endoscopy (EGD)  Vomiting of blood or coffee ground material  New chest pain or pain under the shoulder blades  Painful or persistently difficult swallowing  New shortness of breath  Fever of 100F or higher  Black, tarry-looking stools  FOLLOW UP: If any biopsies were taken you will be contacted by phone or by letter within the next 1-3 weeks.  Call your gastroenterologist if you have not heard about the biopsies in 3 weeks.  Our staff will call the home number listed on your records the next business day following your procedure to check on you and address any questions or concerns that you may have at that time regarding the information given to you following your procedure. This is a courtesy call and so if there is no answer at the home number and we have not heard from you through the emergency physician on call, we will  assume that you have returned to your regular daily activities without incident.  SIGNATURES/CONFIDENTIALITY: You and/or your care partner have signed paperwork which will be entered into your electronic medical record.  These signatures attest to the fact that that the information above on your After Visit Summary has been reviewed and is understood.  Full responsibility of the confidentiality of this discharge information lies with you and/or your care-partner.  Please follow all discharge instructions given to you  by the recovery room nurse. If you have any questions or problems after discharge please call one of the numbers listed above. You will receive a phone call in the am to see how you are doing and answer any questions you may have. Thank you for choosing [facility] for your health care needs.

## 2012-04-11 NOTE — Progress Notes (Signed)
Patient did not experience any of the following events: a burn prior to discharge; a fall within the facility; wrong site/side/patient/procedure/implant event; or a hospital transfer or hospital admission upon discharge from the facility. (G8907) Patient did not have preoperative order for IV antibiotic SSI prophylaxis. (G8918)  

## 2012-04-11 NOTE — Op Note (Signed)
[facility] [address] [place], [address]   COLONOSCOPY PROCEDURE REPORT  PATIENT: Carl Harper, Carl Harper  MR#: [id number] BIRTHDATE: 1934-03-05 , 78  yrs. old GENDER: Male ENDOSCOPIST: Meryl Dare, MD, [facility] PROCEDURE DATE:  04/11/2012 PROCEDURE:   Colonoscopy, diagnostic ASA CLASS:   Class II INDICATIONS:Patient's personal history of adenomatous colon polyps.  MEDICATIONS: MAC sedation, administered by CRNA and propofol (Diprivan) 300mg  IV DESCRIPTION OF PROCEDURE:   After the risks benefits and alternatives of the procedure were thoroughly explained, informed consent was obtained.  A digital rectal exam revealed no abnormalities of the rectum.   The LB [id number]  endoscope was introduced through the anus and advanced to the cecum, which was identified by both the appendix and ileocecal valve. No adverse events experienced.   The quality of the prep was good, using MoviPrep  The instrument was then slowly withdrawn as the colon was fully examined.  COLON FINDINGS: Moderate diverticulosis was noted in the sigmoid colon.   The colon was otherwise normal.  There was no diverticulosis, inflammation, polyps or cancers unless previously stated.  Retroflexed views revealed moderate internal hemorrhoids. The time to cecum=4 minutes 06 seconds.  Withdrawal time=12 minutes 09 seconds.  The scope was withdrawn and the procedure completed.  COMPLICATIONS: There were no complications.  ENDOSCOPIC IMPRESSION: 1.   Moderate diverticulosis was noted in the sigmoid colon 2.  Moderate internal hemorrhoids  RECOMMENDATIONS: 1.  High fiber diet with liberal fluid intake. 2.  Given your age, you will not need another colonoscopy for colon cancer screening or polyp surveillance.  These types of tests usually stop around the age 25. 3.  Resume Plavix today   eSigned:  Meryl Dare, MD, [facility] 04/11/2012 9:41 AM   cc: Tracey Harries, MD

## 2012-04-12 ENCOUNTER — Telehealth: Payer: Self-pay | Admitting: *Deleted

## 2012-04-12 NOTE — Telephone Encounter (Signed)
  Follow up Call-  Call back number 04/11/2012  Post procedure Call Back phone  # [phone]  Permission to leave phone message Yes     Patient questions:  Do you have a fever, pain , or abdominal swelling? no Pain Score  0 *  Have you tolerated food without any problems? yes  Have you been able to return to your normal activities? yes  Do you have any questions about your discharge instructions: Diet   no Medications  no Follow up visit  no  Do you have questions or concerns about your Care? no  Actions: * If pain score is 4 or above: No action needed, pain <4.

## 2012-08-28 ENCOUNTER — Telehealth: Payer: Self-pay | Admitting: *Deleted

## 2012-08-28 NOTE — Telephone Encounter (Signed)
Called and gave verbal okay to refill patient's Zolpidem tart ER 12.5 mg #30 with #2 additional refills to Lesly Dukes -pharmacist @ [facility].

## 2012-09-20 HISTORY — PX: TRANSTHORACIC ECHOCARDIOGRAM: SHX275

## 2012-09-20 HISTORY — PX: NM MYOCAR PERF WALL MOTION: HXRAD629

## 2012-09-26 ENCOUNTER — Encounter: Payer: Self-pay | Admitting: Cardiovascular Disease

## 2012-09-26 ENCOUNTER — Other Ambulatory Visit: Payer: Self-pay | Admitting: *Deleted

## 2012-09-26 DIAGNOSIS — R011 Cardiac murmur, unspecified: Secondary | ICD-10-CM

## 2012-09-26 DIAGNOSIS — N429 Disorder of prostate, unspecified: Secondary | ICD-10-CM

## 2012-09-26 DIAGNOSIS — E782 Mixed hyperlipidemia: Secondary | ICD-10-CM

## 2012-09-26 DIAGNOSIS — R5381 Other malaise: Secondary | ICD-10-CM

## 2012-09-26 DIAGNOSIS — R079 Chest pain, unspecified: Secondary | ICD-10-CM

## 2012-09-26 MED ORDER — ASPIRIN 81 MG PO TABS: 162.0000 mg | ORAL_TABLET | Freq: Every day | ORAL | Status: DC

## 2012-09-26 MED ORDER — METOPROLOL SUCCINATE ER 25 MG PO TB24: ORAL_TABLET | ORAL | Status: DC

## 2012-10-02 ENCOUNTER — Telehealth: Payer: Self-pay | Admitting: Cardiovascular Disease

## 2012-10-02 ENCOUNTER — Encounter: Payer: Self-pay | Admitting: Cardiovascular Disease

## 2012-10-02 NOTE — Telephone Encounter (Signed)
Is wanting to know if Dr.Weintraub has written and sent the new prescription to [facility]

## 2012-10-02 NOTE — Telephone Encounter (Signed)
protonix 40mg  has been sent to [facility]

## 2012-10-02 NOTE — Telephone Encounter (Signed)
Message forwarded to J.C. Wildman, LPN.  

## 2012-10-09 ENCOUNTER — Ambulatory Visit (HOSPITAL_COMMUNITY)
Admission: RE | Admit: 2012-10-09 | Discharge: 2012-10-09 | Disposition: A | Discharge status: Home / Self Care | Payer: Medicare Other | Source: Ambulatory Visit | Attending: Cardiovascular Disease | Admitting: Cardiovascular Disease

## 2012-10-09 DIAGNOSIS — I080 Rheumatic disorders of both mitral and aortic valves: Secondary | ICD-10-CM | POA: Insufficient documentation

## 2012-10-09 DIAGNOSIS — I379 Nonrheumatic pulmonary valve disorder, unspecified: Secondary | ICD-10-CM | POA: Insufficient documentation

## 2012-10-09 DIAGNOSIS — R011 Cardiac murmur, unspecified: Secondary | ICD-10-CM | POA: Insufficient documentation

## 2012-10-09 DIAGNOSIS — R079 Chest pain, unspecified: Secondary | ICD-10-CM

## 2012-10-09 DIAGNOSIS — I2510 Atherosclerotic heart disease of native coronary artery without angina pectoris: Secondary | ICD-10-CM | POA: Insufficient documentation

## 2012-10-09 DIAGNOSIS — I10 Essential (primary) hypertension: Secondary | ICD-10-CM | POA: Insufficient documentation

## 2012-10-09 DIAGNOSIS — G4733 Obstructive sleep apnea (adult) (pediatric): Secondary | ICD-10-CM | POA: Insufficient documentation

## 2012-10-09 DIAGNOSIS — I739 Peripheral vascular disease, unspecified: Secondary | ICD-10-CM | POA: Insufficient documentation

## 2012-10-09 DIAGNOSIS — I079 Rheumatic tricuspid valve disease, unspecified: Secondary | ICD-10-CM | POA: Insufficient documentation

## 2012-10-09 MED ORDER — TECHNETIUM TC 99M SESTAMIBI GENERIC - CARDIOLITE
10.7000 | Freq: Once | INTRAVENOUS | Status: AC | PRN
  Administered 2012-10-09: 11 via INTRAVENOUS

## 2012-10-09 MED ORDER — TECHNETIUM TC 99M SESTAMIBI GENERIC - CARDIOLITE
31.5000 | Freq: Once | INTRAVENOUS | Status: AC | PRN
  Administered 2012-10-09: 32 via INTRAVENOUS

## 2012-10-09 MED ORDER — REGADENOSON 0.4 MG/5ML IV SOLN
0.4000 mg | Freq: Once | INTRAVENOUS | Status: AC
  Administered 2012-10-09: 0.4 mg via INTRAVENOUS

## 2012-10-09 NOTE — Progress Notes (Signed)
[facility]   2D echo completed 10/09/2012.   Veda Canning, RDCS

## 2012-10-09 NOTE — Procedures (Addendum)
[facility] [address] [place] [address] [place] [address] [phone]  Cardiology Nuclear Med Study  BERNARDO BRAYMAN is a 78 y.o. male     MRN : [id number]     DOB: [birth year]  Procedure Date: 10/09/2012  Nuclear Med Background Indication for Stress Test:  Stent Patency and Abnormal EKG History:  MI;CAD;STENT/PTCA--11/07/2011;MURMUR;OSA Cardiac Risk Factors: Claudication, Family History - CAD, History of Smoking, Hypertension, Lipids, Overweight and PVD  Symptoms:  Chest Pain, Dizziness, DOE, Light-Headedness, Near Syncope, Palpitations and SOB   Nuclear Pre-Procedure Caffeine/Decaff Intake:  1:00am NPO After: 11AM   IV Site: R Hand  IV 0.9% NS with Angio Cath:  22g  Chest Size (in):  42"  IV Started by: Emmit Pomfret, RN  Height: 5\' 10"  (1.778 m)  Cup Size: n/a  BMI:  Body mass index is 25.97 kg/(m^2). Weight:  181 lb (82.101 kg)   Tech Comments:  N/A    Nuclear Med Study 1 or 2 day study: 1 day  Stress Test Type:  Lexiscan  Order Authorizing Provider:  Susa Griffins, MD   Resting Radionuclide: Technetium 81m Sestamibi  Resting Radionuclide Dose: 10.7 mCi   Stress Radionuclide:  Technetium 5m Sestamibi  Stress Radionuclide Dose: 31.5 mCi           Stress Protocol Rest HR: 54 Stress HR: 81  Rest BP: 113/72 Stress BP: 130/67  Exercise Time (min): n/a METS: n/a          Dose of Adenosine (mg):  n/a Dose of Lexiscan: 0.4 mg  Dose of Atropine (mg): n/a Dose of Dobutamine: n/a mcg/kg/min (at max HR)  Stress Test Technologist: Ernestene Mention, CCT Nuclear Technologist: Gonzella Lex, CNMT   Rest Procedure:  Myocardial perfusion imaging was performed at rest 45 minutes following the intravenous administration of Technetium 30m Sestamibi. Stress Procedure:  The patient received IV Lexiscan 0.4 mg over 15-seconds.  Technetium 1m Sestamibi injected at 30-seconds.  There were no significant changes with Lexiscan.  Quantitative  spect images were obtained after a 45 minute delay.  Transient Ischemic Dilatation (Normal <1.22):  1.06 Lung/Heart Ratio (Normal <0.45):  0.27 QGS EDV:  92 ml QGS ESV:  36 ml LV Ejection Fraction: 61%  Rest ECG: Sinus bradycardia  Stress ECG: No significant change from baseline ECG  QPS Raw Data Images:  Normal; no motion artifact; normal heart/lung ratio. Stress Images:  There is decreased uptake in the lateral wall. Rest Images:  There is decreased uptake in the inferior wall. Subtraction (SDS):  There is a fixed inferior defect that is most consistent with diaphragmatic attenuation.  Impression Exercise Capacity:  Lexiscan with no exercise. BP Response:  Normal blood pressure response. Clinical Symptoms:  There is dyspnea. ECG Impression:  No significant ECG changes with Lexiscan. Comparison with Prior Nuclear Study: Prior stress test performed in [facility] in 2011 was negative for ischemia.  Overall Impression:  Low risk stress nuclear study with fixed inferior defect and underlying bowel attenuation suggestive of artifact,.  LV Wall Motion:  NL LV Function; NL Wall Motion; EF 61%.  Chrystie Nose, MD, [facility] Board Certified in Nuclear Cardiology Attending Cardiologist [facility]  Chrystie Nose, MD  10/10/2012 1:36 PM

## 2012-11-07 ENCOUNTER — Telehealth: Payer: Self-pay | Admitting: Cardiovascular Disease

## 2012-11-07 NOTE — Telephone Encounter (Signed)
Refills for metoprolol ER 25mg  qd #90 w/3 refills.

## 2012-11-07 NOTE — Telephone Encounter (Signed)
Need refill on his Metoprolol ER 25 mg #90.

## 2012-11-26 ENCOUNTER — Other Ambulatory Visit: Payer: Self-pay | Admitting: *Deleted

## 2012-11-26 MED ORDER — CLOPIDOGREL BISULFATE 75 MG PO TABS: 75.0000 mg | ORAL_TABLET | Freq: Every day | ORAL | Status: DC

## 2012-11-28 NOTE — Telephone Encounter (Signed)
plavix refilled  

## 2012-12-17 DIAGNOSIS — G2581 Restless legs syndrome: Secondary | ICD-10-CM | POA: Diagnosis present

## 2013-01-29 ENCOUNTER — Other Ambulatory Visit: Payer: Self-pay | Admitting: *Deleted

## 2013-01-29 MED ORDER — LOSARTAN POTASSIUM 50 MG PO TABS: 50.0000 mg | ORAL_TABLET | Freq: Every day | ORAL | Status: DC

## 2013-03-19 ENCOUNTER — Encounter: Payer: Self-pay | Admitting: *Deleted

## 2013-03-26 ENCOUNTER — Ambulatory Visit: Payer: Medicare Other | Admitting: Internal Medicine

## 2013-03-28 ENCOUNTER — Ambulatory Visit (INDEPENDENT_AMBULATORY_CARE_PROVIDER_SITE_OTHER): Payer: Medicare Other | Admitting: Internal Medicine

## 2013-03-28 VITALS — BP 122/60 | HR 69 | Ht 70.0 in | Wt 182.0 lb

## 2013-03-28 DIAGNOSIS — Z09 Encounter for follow-up examination after completed treatment for conditions other than malignant neoplasm: Secondary | ICD-10-CM

## 2013-03-28 DIAGNOSIS — Z9989 Dependence on other enabling machines and devices: Secondary | ICD-10-CM

## 2013-03-28 DIAGNOSIS — I4949 Other premature depolarization: Secondary | ICD-10-CM

## 2013-03-28 DIAGNOSIS — I739 Peripheral vascular disease, unspecified: Secondary | ICD-10-CM

## 2013-03-28 DIAGNOSIS — Z789 Other specified health status: Secondary | ICD-10-CM

## 2013-03-28 DIAGNOSIS — I10 Essential (primary) hypertension: Secondary | ICD-10-CM

## 2013-03-28 DIAGNOSIS — Z79899 Other long term (current) drug therapy: Secondary | ICD-10-CM

## 2013-03-28 DIAGNOSIS — I493 Ventricular premature depolarization: Secondary | ICD-10-CM

## 2013-03-28 DIAGNOSIS — Z95828 Presence of other vascular implants and grafts: Secondary | ICD-10-CM

## 2013-03-28 DIAGNOSIS — G4733 Obstructive sleep apnea (adult) (pediatric): Secondary | ICD-10-CM

## 2013-03-28 NOTE — Patient Instructions (Addendum)
Your physician recommends that you schedule a follow-up appointment in:  6 months also you are to have scheduled a lower extremity doppler dtauy

## 2013-03-31 ENCOUNTER — Encounter: Payer: Self-pay | Admitting: Internal Medicine

## 2013-03-31 DIAGNOSIS — G4733 Obstructive sleep apnea (adult) (pediatric): Secondary | ICD-10-CM | POA: Insufficient documentation

## 2013-03-31 DIAGNOSIS — Z9989 Dependence on other enabling machines and devices: Secondary | ICD-10-CM

## 2013-03-31 NOTE — Progress Notes (Signed)
OFFICE NOTE  Chief Complaint:  Establish cardiologist  Primary Care Physician: Phineas Inches, MD  HPI:  Carl Harper is a pleasant 79 year old male previously followed Dr. Rollene Fare with a history of peripheral arterial disease. He underwent diamondback orbital rotational atherectomy by Dr. Alvester Chou in 2013 with stenting of the calcified right common iliac stenosis. He has mild nonobstructive coronary disease by cath in 2001 and a negative Myoview in 2011. He also has obstructive sleep apnea on CPAP has had reflux symptoms and atypical chest pain from time to time.  His echocardiogram does show mild concentric LVH and borderline aortic root dilatation with a possible small ascending aortic aneurysm which will need to be followed. At his last visit with Dr. drop his Toprol was cut down to 25 mg alternating with 12.5 mg every 2 some bradycardia.  He has no real complaints today.  PMHx:  Past Medical History  Diagnosis Date  . Hypertension   . PAD (peripheral artery disease)   . Sleep apnea     dx'd; wore mask; lost weight; turned in machine"  . Dysrhythmia     "irregular"  . Daily headache     "pretty much daily" (11/07/2011)  . Benign neoplasm of colon 10/24/2006  . Diverticulosis 09/03.2008  . Internal hemorrhoids without mention of complication 10/03/4816  . Restless leg syndrome   . BPH (benign prostatic hyperplasia)     Past Surgical History  Procedure Laterality Date  . Iliac artery stent Right 11/07/2011    Diamondback orbital rotational atherectomy, PTA & stenting of calcified R CIA (Dr. Adora Fridge)  . Shoulder arthroscopy w/ rotator cuff repair Left 2010  . Appendectomy  ~ 1954  . Cataract extraction w/ intraocular lens  implant, bilateral  2011-2012  . Cardiac catheterization  03/30/1999    non-critical CAD  . Sleep study  01/09/2011    AHI during total sleep 14.4/hr and during REM 19.4/hr  . Transthoracic echocardiogram  09/2012    EF 55-60%, LA mildly dilated  .  Nm myocar perf wall motion  09/2012    lexiscan myoview - low risk with fixed inferior defect with underlying bowel attenuation suggestive of artifact    FAMHx:  Family History  Problem Relation Age of Onset  . Heart Problems Father 56  . Cancer Brother 46    SOCHx:   reports that he quit smoking about 7 years ago. His smoking use included Cigarettes. He has a 55 pack-year smoking history. He has never used smokeless tobacco. He reports that he drinks about 8.4 ounces of alcohol per week. He reports that he does not use illicit drugs.  ALLERGIES:  No Known Allergies  ROS: A comprehensive review of systems was negative.  HOME MEDS: Current Outpatient Prescriptions  Medication Sig Dispense Refill  . ALPRAZolam (XANAX) 0.5 MG tablet Take 0.5 mg by mouth daily.      Marland Kitchen aspirin 81 MG tablet Take 81 mg by mouth daily.      . clopidogrel (PLAVIX) 75 MG tablet Take 1 tablet (75 mg total) by mouth daily with breakfast.  90 tablet  3  . EVENING PRIMROSE OIL PO Take 1,300 mg by mouth daily.      Marland Kitchen losartan (COZAAR) 50 MG tablet Take 1 tablet (50 mg total) by mouth daily.  90 tablet  5  . metoprolol succinate (TOPROL-XL) 25 MG 24 hr tablet Alternate 25 mg every other day with 12.5 mg every other day      . oxybutynin (  DITROPAN-XL) 10 MG 24 hr tablet       . pramipexole (MIRAPEX) 0.25 MG tablet Take 0.75 mg by mouth as needed.       . Tamsulosin HCl (FLOMAX) 0.4 MG CAPS Take 0.4 mg by mouth daily after supper.       No current facility-administered medications for this visit.    LABS/IMAGING: No results found for this or any previous visit (from the past 48 hour(s)). No results found.  VITALS: BP 122/60  Pulse 69  Ht 5\' 10"  (1.778 m)  Wt 182 lb (82.555 kg)  BMI 26.11 kg/m2  EXAM: General appearance: alert and no distress Neck: no carotid bruit and no JVD Lungs: clear to auscultation bilaterally Heart: regular rate and rhythm, S1, S2 normal, no murmur, click, rub or  gallop Abdomen: soft, non-tender; bowel sounds normal; no masses,  no organomegaly Extremities: extremities normal, atraumatic, no cyanosis or edema Pulses: 2+ and symmetric Skin: Skin color, texture, turgor normal. No rashes or lesions Neurologic: Grossly normal Psych: Mood, affect normal  EKG: Normal sinus rhythm at 69, occasional PVC  ASSESSMENT: 1. Performed here this is status post diamondback were orbital atherectomy and stenting of the right common iliac artery 2. Hypertension 3. Dyslipidemia 4. Mild CAD 5. Obstructive sleep apnea on CPAP  PLAN: 1.   [name] is doing fairly well. He denies any claudication symptoms however it has been 1-1/2 years since his stenting of the right iliac artery. I would recommend a repeat peripheral Dopplers to evaluate for patency of that stent. Otherwise he seems to be doing well with good blood pressure control, cholesterol which has been at goal and he seems to be compliant with CPAP. Plan to see him back in 6 months.  Pixie Casino, MD, [facility] Attending Cardiologist [facility]  Emoni Yang C 03/31/2013, 7:08 PM

## 2013-04-01 ENCOUNTER — Telehealth (HOSPITAL_COMMUNITY): Payer: Self-pay | Admitting: *Deleted

## 2013-04-11 ENCOUNTER — Encounter (HOSPITAL_COMMUNITY): Payer: Medicare Other

## 2013-04-11 LAB — COMPLETE METABOLIC PANEL WITH GFR
ALT: 19 U/L (ref 0–53)
AST: 22 U/L (ref 0–37)
Albumin: 4.7 g/dL (ref 3.5–5.2)
Alkaline Phosphatase: 52 U/L (ref 39–117)
BILIRUBIN TOTAL: 0.7 mg/dL (ref 0.2–1.2)
BUN: 23 mg/dL (ref 6–23)
CO2: 26 meq/L (ref 19–32)
CREATININE: 1.09 mg/dL (ref 0.50–1.35)
Calcium: 9 mg/dL (ref 8.4–10.5)
Chloride: 105 mEq/L (ref 96–112)
GFR, EST AFRICAN AMERICAN: 75 mL/min
GFR, EST NON AFRICAN AMERICAN: 65 mL/min
GLUCOSE: 110 mg/dL — AB (ref 70–99)
Potassium: 4.4 mEq/L (ref 3.5–5.3)
SODIUM: 139 meq/L (ref 135–145)
Total Protein: 7 g/dL (ref 6.0–8.3)

## 2013-04-14 LAB — NMR LIPOPROFILE WITH LIPIDS
Cholesterol, Total: 189 mg/dL (ref <200)
HDL Particle Number: 46.9 umol/L (ref >=30.5)
HDL SIZE: 9.7 nm (ref >=9.2)
HDL-C: 77 mg/dL (ref >=40)
LARGE HDL: 15.2 umol/L (ref >=4.8)
LDL CALC: 100 mg/dL — AB (ref <100)
LDL Particle Number: 1283 nmol/L — ABNORMAL HIGH (ref <1000)
LDL Size: 20.7 nm (ref >20.5)
LP-IR SCORE: 34 (ref <=45)
Large VLDL-P: 2.5 nmol/L (ref <=2.7)
Small LDL Particle Number: 505 nmol/L (ref <=527)
Triglycerides: 59 mg/dL (ref <150)
VLDL Size: 46.8 nm — ABNORMAL HIGH (ref <=46.6)

## 2013-04-15 ENCOUNTER — Encounter: Payer: Self-pay | Admitting: *Deleted

## 2013-04-17 ENCOUNTER — Encounter (HOSPITAL_COMMUNITY): Payer: Medicare Other

## 2013-04-28 ENCOUNTER — Ambulatory Visit (HOSPITAL_COMMUNITY)
Admission: RE | Admit: 2013-04-28 | Discharge: 2013-04-28 | Disposition: A | Discharge status: Home / Self Care | Payer: Medicare Other | Source: Ambulatory Visit | Attending: Cardiovascular Disease | Admitting: Cardiovascular Disease

## 2013-04-28 DIAGNOSIS — Z48812 Encounter for surgical aftercare following surgery on the circulatory system: Secondary | ICD-10-CM | POA: Insufficient documentation

## 2013-04-28 DIAGNOSIS — Z95828 Presence of other vascular implants and grafts: Secondary | ICD-10-CM

## 2013-04-28 DIAGNOSIS — Z789 Other specified health status: Secondary | ICD-10-CM

## 2013-04-28 NOTE — Progress Notes (Signed)
Lower Extremity Arterial Duplex Completed. °Brianna L Mazza,RVT °

## 2013-07-29 ENCOUNTER — Telehealth: Payer: Self-pay | Admitting: *Deleted

## 2013-07-29 NOTE — Telephone Encounter (Signed)
Patient notified that Dr. Debara Pickett will not refill Lorrin Mais and he should defer this to primary doctor. Patient voiced understanding.

## 2013-08-19 ENCOUNTER — Other Ambulatory Visit: Payer: Self-pay

## 2013-08-19 NOTE — Telephone Encounter (Signed)
Refill request for Ambien deferred to PCP-Dr D Bouska. See last telephone note-07/29/2013. Request faxed back to [facility].

## 2013-08-25 ENCOUNTER — Other Ambulatory Visit: Payer: Self-pay | Admitting: *Deleted

## 2013-08-25 MED ORDER — METOPROLOL SUCCINATE ER 25 MG PO TB24: ORAL_TABLET | ORAL | Status: DC

## 2013-09-24 ENCOUNTER — Telehealth: Payer: Self-pay | Admitting: Internal Medicine

## 2013-09-24 DIAGNOSIS — R001 Bradycardia, unspecified: Secondary | ICD-10-CM | POA: Insufficient documentation

## 2013-09-24 NOTE — Telephone Encounter (Signed)
Elmyra Ricks called stating that the patient has been having a low blood pressure and a EKG reflected a blockage. Elmyra Ricks thinks that he may need a Psychologist, forensic. Please call  Thanks

## 2013-09-25 ENCOUNTER — Encounter: Payer: Self-pay | Admitting: Internal Medicine

## 2013-09-25 NOTE — Telephone Encounter (Signed)
Carl Harper called [facility] to request the pt be seen today based on his recent EKG. She is going to fax EKG so we can show to dr croitoru (DOD).

## 2013-09-25 NOTE — Telephone Encounter (Signed)
Please have him stop his b-blocker. Keep his appointment with me for follow-up.  Dr. Debara Pickett

## 2013-09-25 NOTE — Telephone Encounter (Signed)
Verdene Rio at 09/25/2013 8:55 AM     Status: Signed        Elmyra Ricks was calling back to speak with Pamala Hurry..   Left message for nicole to please fax over the information she has. The pt has a follow up appt with dr Debara Pickett 10-10-13.

## 2013-09-25 NOTE — Addendum Note (Signed)
Addended by: Cristopher Estimable on: 09/25/2013 03:28 PM   Modules accepted: Orders, Medications

## 2013-09-25 NOTE — Telephone Encounter (Signed)
Spoke with pt, aware of dr hilty recommendations.

## 2013-09-25 NOTE — Telephone Encounter (Signed)
Discussed with dr croitoru, he feels the pt is fine to wait until appt on the 21st. Will forward to dr hilty to see if event monitor is need prior to appt.

## 2013-09-25 NOTE — Telephone Encounter (Addendum)
EKG's received and shown to dr croitoru, pt does not need to be seen today. Spoke with pt, aware to keep his appt on 10-10-13 with dr hilty. The pt reports he checks his pulse at home and it will get down to 42 is the lowest. He is having problems with dizziness. Will discuss with dr croitour

## 2013-09-25 NOTE — Telephone Encounter (Signed)
Carl Harper was calling back to speak with Carl Harper.Marland Kitchen

## 2013-09-25 NOTE — Telephone Encounter (Signed)
This encounter was created in error - please disregard.

## 2013-09-26 ENCOUNTER — Other Ambulatory Visit: Payer: Self-pay | Admitting: *Deleted

## 2013-09-26 ENCOUNTER — Encounter: Payer: Self-pay | Admitting: Cardiology

## 2013-09-26 ENCOUNTER — Telehealth: Payer: Self-pay | Admitting: Cardiology

## 2013-09-26 ENCOUNTER — Ambulatory Visit (INDEPENDENT_AMBULATORY_CARE_PROVIDER_SITE_OTHER): Payer: Medicare Other | Admitting: Cardiology

## 2013-09-26 VITALS — BP 130/62 | HR 64 | Ht 70.0 in | Wt 184.9 lb

## 2013-09-26 DIAGNOSIS — R0602 Shortness of breath: Secondary | ICD-10-CM

## 2013-09-26 DIAGNOSIS — R002 Palpitations: Secondary | ICD-10-CM

## 2013-09-26 DIAGNOSIS — I493 Ventricular premature depolarization: Secondary | ICD-10-CM

## 2013-09-26 DIAGNOSIS — I4949 Other premature depolarization: Secondary | ICD-10-CM

## 2013-09-26 NOTE — Telephone Encounter (Signed)
Carl Harper was calling for the lab orders to be sent down for pt. He is currently in the office waiting to have labs done..Please call  Thanks

## 2013-09-26 NOTE — Telephone Encounter (Signed)
Returned call to [place]. She drew patient's labs - and drew extra (off his "to-do" list) which was a PSA, CBC with diff, TSH, lipid in addition to BNP ordered today by Dr. Percival Spanish.

## 2013-09-26 NOTE — Progress Notes (Signed)
HPI The patient is seen at the request of BOUSKA,DAVID E, MD.  The patient is followed by Dr. Debara Pickett.  He has a history of PVD.  He was to my schedule because of palpitations.  He says he's had these for years but they've been increasing in frequency over the last 2 months. He feels very fatigued with these.  He's not had any syncope. He does feel palpitations occasionally but mostly he notices it when taking his heart rate and blood pressure. His heart rate very skipping and sometimes very low. He was walking for exercise but he thinks his a little more dyspneic with this and he's been pulling on doing that. He has a little chest discomfort which he thinks he was evaluated for last year at the time of his negative stress perfusion study. He was noted on an EKG to have trigeminy recently at his primary care office. He's not describing any new PND or orthopnea. He's had no weight gain or edema.  No Known Allergies  Current Outpatient Prescriptions  Medication Sig Dispense Refill  . aspirin 81 MG tablet Take 81 mg by mouth daily.      . clopidogrel (PLAVIX) 75 MG tablet Take 1 tablet (75 mg total) by mouth daily with breakfast.  90 tablet  3  . EVENING PRIMROSE OIL PO Take 1,300 mg by mouth daily.      Marland Kitchen losartan (COZAAR) 50 MG tablet Take 1 tablet (50 mg total) by mouth daily.  90 tablet  5  . oxybutynin (DITROPAN-XL) 10 MG 24 hr tablet       . pramipexole (MIRAPEX) 0.25 MG tablet Take 0.75 mg by mouth as needed.       . Tamsulosin HCl (FLOMAX) 0.4 MG CAPS Take 0.4 mg by mouth daily after supper.       No current facility-administered medications for this visit.    Past Medical History  Diagnosis Date  . Hypertension   . PAD (peripheral artery disease)   . Sleep apnea     dx'd; wore mask; lost weight; turned in machine"  . Dysrhythmia     "irregular"  . Daily headache     "pretty much daily" (11/07/2011)  . Benign neoplasm of colon 10/24/2006  . Diverticulosis 09/03.2008  . Internal  hemorrhoids without mention of complication 42/68/3419  . Restless leg syndrome   . BPH (benign prostatic hyperplasia)     Past Surgical History  Procedure Laterality Date  . Iliac artery stent Right 11/07/2011    Diamondback orbital rotational atherectomy, PTA & stenting of calcified R CIA (Dr. Adora Fridge)  . Shoulder arthroscopy w/ rotator cuff repair Left 2010  . Appendectomy  ~ 1954  . Cataract extraction w/ intraocular lens  implant, bilateral  2011-2012  . Cardiac catheterization  03/30/1999    non-critical CAD  . Sleep study  01/09/2011    AHI during total sleep 14.4/hr and during REM 19.4/hr  . Transthoracic echocardiogram  09/2012    EF 55-60%, LA mildly dilated  . Nm myocar perf wall motion  09/2012    lexiscan myoview - low risk with fixed inferior defect with underlying bowel attenuation suggestive of artifact    ROS:   As stated in the HPI and negative for all other systems.   PHYSICAL EXAM BP 130/62  Pulse 64  Ht 5\' 10"  (1.778 m)  Wt 184 lb 14.4 oz (83.87 kg)  BMI 26.53 kg/m2 GENERAL:  Well appearing HEENT:  Pupils equal round and reactive,  fundi not visualized, oral mucosa unremarkable NECK:  No jugular venous distention, waveform within normal limits, carotid upstroke brisk and symmetric, no bruits, no thyromegaly LYMPHATICS:  No cervical, inguinal adenopathy LUNGS:  Clear to auscultation bilaterally BACK:  No CVA tenderness CHEST:  Unremarkable HEART:  PMI not displaced or sustained,S1 and S2 within normal limits, no S3, no S4, no clicks, no rubs, no murmurs ABD:  Flat, positive bowel sounds normal in frequency in pitch, no bruits, no rebound, no guarding, no midline pulsatile mass, no hepatomegaly, no splenomegaly EXT:  2 plus pulses throughout, no edema, no cyanosis no clubbing SKIN:  No rashes no nodules NEURO:  Cranial nerves II through XII grossly intact, motor grossly intact throughout PSYCH:  Cognitively intact, oriented to person place and time   EKG:   Sinus rhythm, rate 64, left axis deviation, left anterior fascicular block, borderline interventricular conduction delay, borderline first degree AV block, premature ventricular contractions, no acute ST-T wave changes. 09/26/2013  ASSESSMENT AND PLAN  PVCs:  I will start with a 24-hour Holter. We can check with BOUSKA,DAVID E, MD to see if the patient has had recent electrolytes and a TSH.  For now I will not change his medications. We did discuss the need to eliminate the caffeine in his drinking.  SOB:  I will check a BNP level. He's had a normal echo as recently as last year.  I will also bring him back for a POET (Plain Old Exercise Treadmill)  SLEEP APNEA:  We did have a discussion about this as he has a history of headaches and he has been fatigued and is not having treatment of this. He's not sure whether he has this diagnosis and thinks any apneic episodes have improved since weight loss. I suggested that he clarify this diagnosis.  The patient has follow up with Dr. Debara Pickett

## 2013-09-26 NOTE — Patient Instructions (Addendum)
Your physician recommends that you schedule a follow-up appointment in: 2 weeks with Dr. Debara Pickett  We are ordering a 24 hr holtor monitor   We are ordering a stress test  We are ordering some blood work

## 2013-09-27 LAB — BRAIN NATRIURETIC PEPTIDE: BRAIN NATRIURETIC PEPTIDE: 63 pg/mL (ref 0.0–100.0)

## 2013-09-27 NOTE — Progress Notes (Signed)
Pt. Informed of his labs and copy sent to Dr. Coletta Memos

## 2013-10-03 ENCOUNTER — Telehealth (HOSPITAL_COMMUNITY): Payer: Self-pay

## 2013-10-03 NOTE — Telephone Encounter (Signed)
Encounter complete. 

## 2013-10-07 DIAGNOSIS — Z961 Presence of intraocular lens: Secondary | ICD-10-CM | POA: Insufficient documentation

## 2013-10-08 ENCOUNTER — Ambulatory Visit (HOSPITAL_COMMUNITY)
Admission: RE | Admit: 2013-10-08 | Discharge: 2013-10-08 | Disposition: A | Discharge status: Home / Self Care | Payer: Medicare Other | Source: Ambulatory Visit | Attending: Internal Medicine | Admitting: Internal Medicine

## 2013-10-08 DIAGNOSIS — R002 Palpitations: Secondary | ICD-10-CM | POA: Insufficient documentation

## 2013-10-08 DIAGNOSIS — R0602 Shortness of breath: Secondary | ICD-10-CM | POA: Insufficient documentation

## 2013-10-08 NOTE — Procedures (Signed)
Exercise Treadmill Test  Test  Exercise Tolerance Test Ordering MD: Marijo File, MD    Unique Test No: 1   Treadmill:  1  Indication for ETT: exertional dyspnea  Contraindication to ETT: No   Stress Modality: exercise - treadmill  Cardiac Imaging Performed: non   Protocol: standard Bruce - maximal  Max BP:  139/87  Max MPHR (bpm):  143 85% MPR (bpm):  121  MPHR obtained (bpm):  137 % MPHR obtained:  95  Reached 85% MPHR (min:sec):  3:45 Total Exercise Time (min-sec):  6  Workload in METS:  7.0 Borg Scale: 13  Reason ETT Terminated:  General Fatigue and  Marked SOB    ST Segment Analysis At Rest: normal ST segments - no evidence of significant ST depression With Exercise: no evidence of significant ST depression  Other Information Arrhythmia:  Yes Angina during ETT:  absent (0) Quality of ETT:  diagnostic  ETT Interpretation:  normal - no evidence of ischemia by ST analysis  Comments: Duke Treadmill Score +6 PVC's and couplets noted with exercise Good exercise tolerance given age. Flat blood pressure response to exercise.  Pixie Casino, MD, [facility] Attending Cardiologist [place]

## 2013-10-10 ENCOUNTER — Ambulatory Visit (INDEPENDENT_AMBULATORY_CARE_PROVIDER_SITE_OTHER): Payer: Medicare Other | Admitting: Internal Medicine

## 2013-10-10 ENCOUNTER — Encounter: Payer: Self-pay | Admitting: Internal Medicine

## 2013-10-10 VITALS — BP 112/58 | HR 74 | Ht 69.0 in | Wt 185.0 lb

## 2013-10-10 DIAGNOSIS — I739 Peripheral vascular disease, unspecified: Secondary | ICD-10-CM

## 2013-10-10 DIAGNOSIS — R0609 Other forms of dyspnea: Secondary | ICD-10-CM | POA: Insufficient documentation

## 2013-10-10 DIAGNOSIS — R0989 Other specified symptoms and signs involving the circulatory and respiratory systems: Secondary | ICD-10-CM

## 2013-10-10 DIAGNOSIS — I4949 Other premature depolarization: Secondary | ICD-10-CM

## 2013-10-10 DIAGNOSIS — I493 Ventricular premature depolarization: Secondary | ICD-10-CM

## 2013-10-10 DIAGNOSIS — I10 Essential (primary) hypertension: Secondary | ICD-10-CM

## 2013-10-10 NOTE — Progress Notes (Signed)
OFFICE NOTE  Chief Complaint:  Follow-up  Primary Care Physician: Phineas Inches, MD  HPI:  Carl Harper is a pleasant 79 year old male previously followed Dr. Rollene Fare with a history of peripheral arterial disease. He underwent diamondback orbital rotational atherectomy by Dr. Alvester Chou in 2013 with stenting of the calcified right common iliac stenosis. He has mild nonobstructive coronary disease by cath in 2001 and a negative Myoview in 2011. He also has obstructive sleep apnea on CPAP has had reflux symptoms and atypical chest pain from time to time.  His echocardiogram does show mild concentric LVH and borderline aortic root dilatation with a possible small ascending aortic aneurysm which will need to be followed. At his last visit with Dr. drop his Toprol was cut down to 25 mg alternating with 12.5 mg every 2 some bradycardia.   Carl Harper was recently seen by Dr. Percival Spanish in the office for shortness of breath and palpitations as well as exertional dyspnea. He wore the monitor however that was never performed as no monitors were available. He did undergo an exercise tolerance test for which he exercised for 6 minutes and 7 metabolic equivalents. There was no evidence of ischemia. He did have PVCs and a fairly flat blood pressure response to exercise. There was marked dyspnea with exertion. He also is reported recent tremors, some memory loss and instability with his gait.  PMHx:  Past Medical History  Diagnosis Date  . Hypertension   . PAD (peripheral artery disease)   . Sleep apnea     dx'd; wore mask; lost weight; turned in machine"  . Dysrhythmia     "irregular"  . Daily headache   . Benign neoplasm of colon 10/24/2006  . Diverticulosis 09/03.2008  . Internal hemorrhoids without mention of complication 08/65/7846  . Restless leg syndrome   . BPH (benign prostatic hyperplasia)     Past Surgical History  Procedure Laterality Date  . Iliac artery stent Right 11/07/2011    Diamondback orbital rotational atherectomy, PTA & stenting of calcified R CIA (Dr. Adora Fridge)  . Shoulder arthroscopy w/ rotator cuff repair Left 2010  . Appendectomy  ~ 1954  . Cataract extraction w/ intraocular lens  implant, bilateral  2011-2012  . Cardiac catheterization  03/30/1999    non-critical CAD  . Sleep study  01/09/2011    AHI during total sleep 14.4/hr and during REM 19.4/hr  . Transthoracic echocardiogram  09/2012    EF 55-60%, LA mildly dilated  . Nm myocar perf wall motion  09/2012    lexiscan myoview - low risk with fixed inferior defect with underlying bowel attenuation suggestive of artifact    FAMHx:  Family History  Problem Relation Age of Onset  . Heart Problems Father 44  . Cancer Brother 14    SOCHx:   reports that he quit smoking about 7 years ago. His smoking use included Cigarettes. He has a 55 pack-year smoking history. He has never used smokeless tobacco. He reports that he drinks about 8.4 ounces of alcohol per week. He reports that he does not use illicit drugs.  ALLERGIES:  Allergies  Allergen Reactions  . Bee Venom     ROS: A comprehensive review of systems was negative except for: Cardiovascular: positive for dyspnea, fatigue and palpitations Neurological: positive for gait problems, memory problems and tremors  HOME MEDS: Current Outpatient Prescriptions  Medication Sig Dispense Refill  . pantoprazole (PROTONIX) 40 MG tablet Take 1 tablet by mouth daily.      Marland Kitchen  zolpidem (AMBIEN CR) 6.25 MG CR tablet Take 12.5 mg by mouth at bedtime as needed.      Marland Kitchen aspirin 81 MG tablet Take 81 mg by mouth daily.      . clopidogrel (PLAVIX) 75 MG tablet Take 1 tablet (75 mg total) by mouth daily with breakfast.  90 tablet  3  . EVENING PRIMROSE OIL PO Take 1,300 mg by mouth daily.      Marland Kitchen losartan (COZAAR) 50 MG tablet Take 1 tablet (50 mg total) by mouth daily.  90 tablet  5  . oxybutynin (DITROPAN-XL) 10 MG 24 hr tablet       . pramipexole (MIRAPEX) 0.25 MG  tablet Take 0.75 mg by mouth as needed.       . Tamsulosin HCl (FLOMAX) 0.4 MG CAPS Take 0.4 mg by mouth daily after supper.       No current facility-administered medications for this visit.    LABS/IMAGING: No results found for this or any previous visit (from the past 48 hour(s)). No results found.  VITALS: BP 112/58  Pulse 74  Ht 5\' 9"  (1.753 m)  Wt 185 lb (83.915 kg)  BMI 27.31 kg/m2  EXAM: General appearance: alert and no distress Neck: no carotid bruit and no JVD Lungs: clear to auscultation bilaterally Heart: regular rate and rhythm, S1, S2 normal, no murmur, click, rub or gallop Abdomen: soft, non-tender; bowel sounds normal; no masses,  no organomegaly Extremities: extremities normal, atraumatic, no cyanosis or edema Pulses: 2+ and symmetric Skin: Skin color, texture, turgor normal. No rashes or lesions Neurologic: Grossly normal Psych: Mood, affect normal  EKG: deferred  ASSESSMENT: 1. PAD status post diamondback were orbital atherectomy and stenting of the right common iliac artery 2. Hypertension 3. Dyslipidemia 4. Mild CAD 5. Obstructive sleep apnea on CPAP 6. DOE - negative treadmill stress test 7. Tremor/memory loss/gait abnormalities  PLAN: 1.   Carl Harper has been having some progressive shortness of breath with exertion. His lungs are clear however he does not seem to be moving much air on exam. He has recently been having some problems with tremor, memory loss and gait abnormalities, which could be concerning for underlying neurologic disorder. I wonder if it could be related to Parkinson's. Perhaps he has some muscle weakness that could be causing her shortness of breath. I do not think is a cardiac cause for his shortness of breath. He is having PVCs based had this for some time. He's also recently had problems with labile blood pressures which could indicate a central nervous system problem. He will need more workup for this through his primary care  provider. She continued to have problems with breathing he may need pulmonary function testing. I offered that today but he politely declined. He is to contact us back if he has more shortness of breath and weak consider lumbar function testing echocardiography or other workup.  Pixie Casino, MD, [facility] Attending Cardiologist [facility]  Aaryan Essman C 10/10/2013, 3:11 PM

## 2013-10-10 NOTE — Patient Instructions (Signed)
Your physician wants you to follow-up in: 6 months. You will receive a reminder letter in the mail two months in advance. If you don't receive a letter, please call our office to schedule the follow-up appointment.  Please call our office should your shortness of breath get worse.

## 2013-10-21 ENCOUNTER — Telehealth: Payer: Self-pay | Admitting: *Deleted

## 2013-10-21 NOTE — Telephone Encounter (Signed)
Faxed clearance to [facility] - for eye procedure - can hold aspirin for 7 days and plavix for 2 days

## 2013-10-30 ENCOUNTER — Other Ambulatory Visit: Payer: Self-pay | Admitting: *Deleted

## 2013-10-30 MED ORDER — PANTOPRAZOLE SODIUM 40 MG PO TBEC: 40.0000 mg | DELAYED_RELEASE_TABLET | Freq: Every day | ORAL | Status: DC

## 2013-10-30 NOTE — Telephone Encounter (Signed)
Rx refill sent to patient pharmacy   

## 2013-11-10 ENCOUNTER — Encounter: Payer: Self-pay | Admitting: Gastroenterology

## 2013-11-24 ENCOUNTER — Telehealth (HOSPITAL_COMMUNITY): Payer: Self-pay | Admitting: *Deleted

## 2013-11-25 ENCOUNTER — Telehealth (HOSPITAL_COMMUNITY): Payer: Self-pay | Admitting: *Deleted

## 2013-11-25 NOTE — Telephone Encounter (Signed)
Pt has to have surgery and will need to come off of his plavix. Please call patient and advise him of what to do.

## 2013-11-27 ENCOUNTER — Ambulatory Visit (HOSPITAL_COMMUNITY)
Admission: RE | Admit: 2013-11-27 | Discharge: 2013-11-27 | Disposition: A | Discharge status: Home / Self Care | Payer: Medicare Other | Source: Ambulatory Visit | Attending: Cardiology | Admitting: Cardiology

## 2013-11-27 ENCOUNTER — Other Ambulatory Visit (HOSPITAL_COMMUNITY): Payer: Self-pay | Admitting: Internal Medicine

## 2013-11-27 DIAGNOSIS — I6529 Occlusion and stenosis of unspecified carotid artery: Secondary | ICD-10-CM | POA: Insufficient documentation

## 2013-11-27 NOTE — Progress Notes (Signed)
Carotid Duplex Completed. °Brianna L Mazza,RVT °

## 2013-11-27 NOTE — Telephone Encounter (Signed)
Pt. Is having eye lid surgery at [facility] with Dr. Lorin Mercy wants to know can he come off Plavix and how long before surgery

## 2013-11-27 NOTE — Telephone Encounter (Signed)
Pt. Called with instructions about plavix , stopping plavix 5 days prior to procedure and restarting 1 day afterwards

## 2013-11-27 NOTE — Telephone Encounter (Signed)
Stop plavix for 5 days prior to surgery and restart usually 1 day after.  Dr. Lemmie Evens

## 2013-12-01 ENCOUNTER — Telehealth: Payer: Self-pay | Admitting: *Deleted

## 2013-12-01 NOTE — Telephone Encounter (Signed)
Patient had question regarding beta-blocker toprol xl. He was told to d/c in August but has since started back a few weeks ago after picking it up from pharmacy. He reports stable BPs and HR 50s-60s. He is not dizzy. Dr. Debara Pickett said he should continue medication as directed. Med list updated.

## 2013-12-11 DIAGNOSIS — Z9889 Other specified postprocedural states: Secondary | ICD-10-CM | POA: Insufficient documentation

## 2013-12-19 ENCOUNTER — Telehealth: Payer: Self-pay | Admitting: *Deleted

## 2013-12-19 NOTE — Telephone Encounter (Signed)
Returning your call. °

## 2013-12-19 NOTE — Telephone Encounter (Signed)
Spoke with wife.  He never wore the heart monitor.  He did not receive one.

## 2014-01-01 ENCOUNTER — Other Ambulatory Visit: Payer: Self-pay | Admitting: Cardiovascular Disease

## 2014-01-01 NOTE — Telephone Encounter (Signed)
Rx was sent to pharmacy electronically. 

## 2014-01-09 ENCOUNTER — Ambulatory Visit (INDEPENDENT_AMBULATORY_CARE_PROVIDER_SITE_OTHER): Payer: Medicare Other | Admitting: Internal Medicine

## 2014-01-09 ENCOUNTER — Encounter: Payer: Self-pay | Admitting: Internal Medicine

## 2014-01-09 VITALS — BP 120/62 | HR 57 | Ht 70.0 in | Wt 186.0 lb

## 2014-01-09 DIAGNOSIS — I10 Essential (primary) hypertension: Secondary | ICD-10-CM

## 2014-01-09 DIAGNOSIS — R413 Other amnesia: Secondary | ICD-10-CM

## 2014-01-09 DIAGNOSIS — G4733 Obstructive sleep apnea (adult) (pediatric): Secondary | ICD-10-CM

## 2014-01-09 DIAGNOSIS — I493 Ventricular premature depolarization: Secondary | ICD-10-CM

## 2014-01-09 DIAGNOSIS — Z9989 Dependence on other enabling machines and devices: Secondary | ICD-10-CM

## 2014-01-09 DIAGNOSIS — I739 Peripheral vascular disease, unspecified: Secondary | ICD-10-CM

## 2014-01-09 DIAGNOSIS — R0609 Other forms of dyspnea: Secondary | ICD-10-CM

## 2014-01-09 DIAGNOSIS — R251 Tremor, unspecified: Secondary | ICD-10-CM

## 2014-01-09 NOTE — Patient Instructions (Signed)
You have been referred to Dr. Tomi Likens (Neurologist)   Your physician wants you to follow-up in: 6 months with Dr. Debara Pickett. You will receive a reminder letter in the mail two months in advance. If you don't receive a letter, please call our office to schedule the follow-up appointment.

## 2014-01-12 ENCOUNTER — Encounter: Payer: Self-pay | Admitting: Internal Medicine

## 2014-01-12 NOTE — Progress Notes (Signed)
OFFICE NOTE  Chief Complaint:  Sinking feeling in his chest  Primary Care Physician: Phineas Inches, MD  HPI:  Carl Harper is a pleasant 79 year old male previously followed Dr. Rollene Fare with a history of peripheral arterial disease. He underwent diamondback orbital rotational atherectomy by Dr. Alvester Chou in 2013 with stenting of the calcified right common iliac stenosis. He has mild nonobstructive coronary disease by cath in 2001 and a negative Myoview in 2011. He also has obstructive sleep apnea on CPAP has had reflux symptoms and atypical chest pain from time to time.  His echocardiogram does show mild concentric LVH and borderline aortic root dilatation with a possible small ascending aortic aneurysm which will need to be followed. At his last visit with Dr. drop his Toprol was cut down to 25 mg alternating with 12.5 mg every 2 some bradycardia.   Carl Harper was recently seen by Dr. Percival Spanish in the office for shortness of breath and palpitations as well as exertional dyspnea. He wore the monitor however that was never performed as no monitors were available. He did undergo an exercise tolerance test for which he exercised for 6 minutes and 7 metabolic equivalents. There was no evidence of ischemia. He did have PVCs and a fairly flat blood pressure response to exercise. There was marked dyspnea with exertion. He also is reported recent tremors, some memory loss and instability with his gait.  Carl Harper returns today and is complaining of a sinking spell his chest. When pressed further he felt like he was possibly presyncopal occasionally has trouble getting his breath. He has some occasional dizziness. He was evaluated for monitor however none was placed because there was not a monitor available. He did have his metoprolol increased to 12.5/25 mg every other day. He seems to think that this is helped his symptoms and feels better than he had when he saw Dr. Percival Spanish.  PMHx:  Past  Medical History  Diagnosis Date  . Hypertension   . PAD (peripheral artery disease)   . Sleep apnea     dx'd; wore mask; lost weight; turned in machine"  . Dysrhythmia     "irregular"  . Daily headache   . Benign neoplasm of colon 10/24/2006  . Diverticulosis 09/03.2008  . Internal hemorrhoids without mention of complication 40/98/1191  . Restless leg syndrome   . BPH (benign prostatic hyperplasia)     Past Surgical History  Procedure Laterality Date  . Iliac artery stent Right 11/07/2011    Diamondback orbital rotational atherectomy, PTA & stenting of calcified R CIA (Dr. Adora Fridge)  . Shoulder arthroscopy w/ rotator cuff repair Left 2010  . Appendectomy  ~ 1954  . Cataract extraction w/ intraocular lens  implant, bilateral  2011-2012  . Cardiac catheterization  03/30/1999    non-critical CAD  . Sleep study  01/09/2011    AHI during total sleep 14.4/hr and during REM 19.4/hr  . Transthoracic echocardiogram  09/2012    EF 55-60%, LA mildly dilated  . Nm myocar perf wall motion  09/2012    lexiscan myoview - low risk with fixed inferior defect with underlying bowel attenuation suggestive of artifact    FAMHx:  Family History  Problem Relation Age of Onset  . Heart Problems Father 72  . Cancer Brother 60    SOCHx:   reports that he quit smoking about 7 years ago. His smoking use included Cigarettes. He has a 55 pack-year smoking history. He has never used smokeless tobacco. He  reports that he drinks about 8.4 oz of alcohol per week. He reports that he does not use illicit drugs.  ALLERGIES:  Allergies  Allergen Reactions  . Bee Venom     ROS: A comprehensive review of systems was negative except for: Cardiovascular: positive for dyspnea, fatigue and palpitations Neurological: positive for gait problems, memory problems and tremors  HOME MEDS: Current Outpatient Prescriptions  Medication Sig Dispense Refill  . aspirin 81 MG tablet Take 81 mg by mouth daily.    .  clopidogrel (PLAVIX) 75 MG tablet TAKE 1 TABLET (75 MG TOTAL) BY MOUTH DAILY WITH BREAKFAST. 90 tablet 2  . EVENING PRIMROSE OIL PO Take 1,300 mg by mouth daily.    Marland Kitchen losartan (COZAAR) 50 MG tablet Take 1 tablet (50 mg total) by mouth daily. 90 tablet 5  . metoprolol succinate (TOPROL-XL) 25 MG 24 hr tablet Take 12.5-25 mg by mouth. Alternating Dose.    . oxybutynin (DITROPAN XL) 15 MG 24 hr tablet Take 1 tablet by mouth daily.    . pantoprazole (PROTONIX) 40 MG tablet Take 1 tablet (40 mg total) by mouth daily. 90 tablet 2  . pramipexole (MIRAPEX) 0.75 MG tablet Take 1 tablet by mouth daily.    . Tamsulosin HCl (FLOMAX) 0.4 MG CAPS Take 0.4 mg by mouth daily after supper.    . zolpidem (AMBIEN CR) 6.25 MG CR tablet Take 12.5 mg by mouth at bedtime as needed.     No current facility-administered medications for this visit.    LABS/IMAGING: No results found for this or any previous visit (from the past 48 hour(s)). No results found.  VITALS: BP 120/62 mmHg  Pulse 57  Ht 5\' 10"  (1.778 m)  Wt 186 lb (84.369 kg)  BMI 26.69 kg/m2  EXAM: General appearance: alert and no distress Neck: no carotid bruit and no JVD Lungs: clear to auscultation bilaterally Heart: regular rate and rhythm, S1, S2 normal, no murmur, click, rub or gallop Abdomen: soft, non-tender; bowel sounds normal; no masses,  no organomegaly Extremities: extremities normal, atraumatic, no cyanosis or edema Pulses: 2+ and symmetric Skin: Skin color, texture, turgor normal. No rashes or lesions Neurologic: Grossly normal Psych: Mood, affect normal  EKG: Sinus bradycardia with first degree AV block   ASSESSMENT: 1. PAD status post diamondback were orbital atherectomy and stenting of the right common iliac artery 2. Hypertension 3. Dyslipidemia 4. Mild CAD 5. Obstructive sleep apnea on CPAP 6. DOE - negative treadmill stress test 7. Tremor/memory loss/gait abnormalities  PLAN: 1.   Carl Harper has been having some  progressive shortness of breath with exertion. His lungs are clear however he does not seem to be moving much air on exam. He has recently been having some problems with tremor, memory loss and gait abnormalities, which could be concerning for underlying neurologic disorder. I wonder if it could be related to Parkinson's. Perhaps he has some muscle weakness that could be causing her shortness of breath. I do not think is a cardiac cause for his shortness of breath. He is having PVCs based had this for some time. Those symptoms seem to have improved on a beta blocker. I'm not sure that he needs a monitor at this time. It he could benefit from seeing a neurologist for some of the above-named symptoms. I will make a referral to neurology.  Pixie Casino, MD, [facility] Attending Cardiologist [facility]  Dalana Pfahler C 01/12/2014, 2:58 PM

## 2014-01-29 ENCOUNTER — Encounter (HOSPITAL_COMMUNITY): Payer: Self-pay | Admitting: Cardiovascular Disease

## 2014-02-11 ENCOUNTER — Telehealth: Payer: Self-pay | Admitting: Internal Medicine

## 2014-02-11 NOTE — Telephone Encounter (Signed)
If the patient is having an increase in his symptoms he should present to the ED for immediate evaluation.  Otherwise, if symptoms are relatively stable, he should have the next available appt with APP or Dr. Debara Harper.

## 2014-02-11 NOTE — Telephone Encounter (Addendum)
Subjective:   Carl Harper is a 79 y.o. male complaining of mild chest pain, most often  on the right side a few inches below the neck. Onset was ongoing, and began 2-3 weeks ago, and lasting to the present time. He describes his pain as brief, lasting a few minutes typically, happening several times a day, and does not radiate. Pain is triggered by: minimal activity and rest. He has tried nothing for pain relief. He is alert. Other symptom of concern is SOB x2-3 weeks w/ or w/o activity. He reports nausea w/ some of the events. Other history: CAD, hypertension, PAD, PVCs, and palpitations. Also GERD. He denies symptoms being similar to his reflux issues. Patient is normally active, he reports daily stretch exercises and walks 1-1.5 miles, but will not walk if CP is bothering him.   Advised patient will communicate with DOD on best recommendation.

## 2014-02-11 NOTE — Telephone Encounter (Signed)
Patient states that he has had problems with shortness of breath and chest pain over the past several weeks.

## 2014-02-16 NOTE — Telephone Encounter (Signed)
I have talked to this pt. And He will Make an appt soon with either Dr. Debara Pickett  Or one of the other cardiologist

## 2014-02-25 ENCOUNTER — Other Ambulatory Visit: Payer: Self-pay | Admitting: *Deleted

## 2014-02-25 MED ORDER — LOSARTAN POTASSIUM 50 MG PO TABS: 50.0000 mg | ORAL_TABLET | Freq: Every day | ORAL | Status: DC

## 2014-02-27 ENCOUNTER — Ambulatory Visit (INDEPENDENT_AMBULATORY_CARE_PROVIDER_SITE_OTHER): Payer: Medicare HMO | Admitting: Neurology

## 2014-02-27 ENCOUNTER — Encounter: Payer: Self-pay | Admitting: Neurology

## 2014-02-27 VITALS — BP 130/72 | HR 70 | Temp 97.5°F | Resp 16 | Ht 70.0 in | Wt 188.8 lb

## 2014-02-27 DIAGNOSIS — R251 Tremor, unspecified: Secondary | ICD-10-CM

## 2014-02-27 DIAGNOSIS — R413 Other amnesia: Secondary | ICD-10-CM

## 2014-02-27 DIAGNOSIS — R252 Cramp and spasm: Secondary | ICD-10-CM

## 2014-02-27 DIAGNOSIS — G2581 Restless legs syndrome: Secondary | ICD-10-CM

## 2014-02-27 NOTE — Progress Notes (Signed)
NEUROLOGY CONSULTATION NOTE  Carl Harper MRN: [id number] DOB: April 07, 1934  Referring provider: Dr. Debara Pickett Primary care provider: Dr. Coletta Memos  Reason for consult:  Memory problems, tremor  HISTORY OF PRESENT ILLNESS: Carl Harper is a 80 year old right-handed man with hypertension, peripheral artery disease, BPH and sleep apnea who presents for memory problems and tremor.  Records reviewed.  He is accompanied by his wife who provides some history.  MEMORY:  He has had worsening memory for about 2 years.  It mostly is short-term.  For example, he will sometimes forget recent events such as going out to dinner with friends.  He also has difficulty recalling names of acquaintances but not close friends or family.  He repeats questions and will repeat conversations.  He pays the bills without problems.  He does not forget about taking his medications correctly.  He does not feel depressed.  He has not had any change in personality or behavior.  His father had dementia.  TREMOR: & UNSTEADY GAIT:  For the last few years, he has had a mild tremor.  It is noticeable when he is writing.  He also will drop objects such as a pen.  He denies problems holding a fork or spoon.  He denies problems swallowing.  He denies difficulty getting dress other than just doing it slower.  He denies symptoms such as freezing.  He denies falls.  He reports a good sense of smell.  He has a history of vivid dreams but nothing that sounds like REM sleep behavior.  RESTLESS LEG SYNDROME:  For the past 2 years, he describes an uncomfortable sensation in his legs, like a vibrating motor.  It compels him to move his legs which provides temporary relief.  He also reports cramping in his legs.  He has been on Mirapex for a year, which has been helpful.  He currently takes 0.75mg  at night.Marland Kitchen  PAST MEDICAL HISTORY: Past Medical History  Diagnosis Date  . Hypertension   . PAD (peripheral artery disease)   . Sleep apnea    dx'd; wore mask; lost weight; turned in machine"  . Dysrhythmia     "irregular"  . Daily headache   . Benign neoplasm of colon 10/24/2006  . Diverticulosis 09/03.2008  . Internal hemorrhoids without mention of complication 39/76/7341  . Restless leg syndrome   . BPH (benign prostatic hyperplasia)   . Memory loss     PAST SURGICAL HISTORY: Past Surgical History  Procedure Laterality Date  . Iliac artery stent Right 11/07/2011    Diamondback orbital rotational atherectomy, PTA & stenting of calcified R CIA (Dr. Adora Fridge)  . Shoulder arthroscopy w/ rotator cuff repair Left 2010  . Appendectomy  ~ 1954  . Cataract extraction w/ intraocular lens  implant, bilateral  2011-2012  . Cardiac catheterization  03/30/1999    non-critical CAD  . Sleep study  01/09/2011    AHI during total sleep 14.4/hr and during REM 19.4/hr  . Transthoracic echocardiogram  09/2012    EF 55-60%, LA mildly dilated  . Nm myocar perf wall motion  09/2012    lexiscan myoview - low risk with fixed inferior defect with underlying bowel attenuation suggestive of artifact  . Lower extremity angiogram N/A 11/07/2011    Procedure: LOWER EXTREMITY ANGIOGRAM;  Surgeon: Lorretta Harp, MD;  Location: [facility] CATH LAB;  Service: Cardiovascular;  Laterality: N/A;  . Atherectomy Right 11/07/2011    Procedure: ATHERECTOMY;  Surgeon: Lorretta Harp, MD;  Location:  [facility] CATH LAB;  Service: Cardiovascular;  Laterality: Right;  . Percutaneous stent intervention  11/07/2011    Procedure: PERCUTANEOUS STENT INTERVENTION;  Surgeon: Lorretta Harp, MD;  Location: [facility] CATH LAB;  Service: Cardiovascular;;    MEDICATIONS: Current Outpatient Prescriptions on File Prior to Visit  Medication Sig Dispense Refill  . aspirin 81 MG tablet Take 81 mg by mouth daily.    . clopidogrel (PLAVIX) 75 MG tablet TAKE 1 TABLET (75 MG TOTAL) BY MOUTH DAILY WITH BREAKFAST. 90 tablet 2  . EVENING PRIMROSE OIL PO Take 1,300 mg by mouth daily.    Marland Kitchen losartan  (COZAAR) 50 MG tablet Take 1 tablet (50 mg total) by mouth daily. 90 tablet 2  . metoprolol succinate (TOPROL-XL) 25 MG 24 hr tablet Take 12.5-25 mg by mouth. Alternating Dose.    . oxybutynin (DITROPAN XL) 15 MG 24 hr tablet Take 1 tablet by mouth daily.    . pantoprazole (PROTONIX) 40 MG tablet Take 1 tablet (40 mg total) by mouth daily. 90 tablet 2  . pramipexole (MIRAPEX) 0.75 MG tablet Take 1 tablet by mouth daily.    . Tamsulosin HCl (FLOMAX) 0.4 MG CAPS Take 0.4 mg by mouth daily after supper.    . zolpidem (AMBIEN CR) 6.25 MG CR tablet Take 12.5 mg by mouth at bedtime as needed.     No current facility-administered medications on file prior to visit.    ALLERGIES: Allergies  Allergen Reactions  . Bee Venom     FAMILY HISTORY: Family History  Problem Relation Age of Onset  . Heart Problems Father 45  . Cancer Brother 67  . Heart failure Brother   . Cancer Maternal Grandfather     prostate    SOCIAL HISTORY: History   Social History  . Marital Status: Married    Spouse Name: N/A    Number of Children: 0  . Years of Education: 13   Occupational History  . retired    Social History Main Topics  . Smoking status: Former Smoker -- 1.00 packs/day for 55 years    Types: Cigarettes    Quit date: 02/20/2006  . Smokeless tobacco: Never Used  . Alcohol Use: 8.4 oz/week    14 Glasses of wine per week     Comment: 11/07/2011 "couple drinks of wine q night"  . Drug Use: No  . Sexual Activity: Not Currently   Other Topics Concern  . Not on file   Social History Narrative    REVIEW OF SYSTEMS: Constitutional: No fevers, chills, or sweats, no generalized fatigue, change in appetite Eyes: No visual changes, double vision, eye pain Ear, nose and throat: No hearing loss, ear pain, nasal congestion, sore throat Cardiovascular: No chest pain, palpitations Respiratory:  No shortness of breath at rest or with exertion, wheezes GastrointestinaI: No nausea, vomiting,  diarrhea, abdominal pain, fecal incontinence Genitourinary:  No dysuria, urinary retention or frequency Musculoskeletal:  No neck pain, back pain Integumentary: No rash, pruritus, skin lesions Neurological: as above Psychiatric: No depression, insomnia, anxiety Endocrine: No palpitations, fatigue, diaphoresis, mood swings, change in appetite, change in weight, increased thirst Hematologic/Lymphatic:  No anemia, purpura, petechiae. Allergic/Immunologic: no itchy/runny eyes, nasal congestion, recent allergic reactions, rashes  PHYSICAL EXAM: Filed Vitals:   02/27/14 1404  BP: 130/72  Pulse: 70  Temp: 97.5 F (36.4 C)  Resp: 16   General: No acute distress Head:  Normocephalic/atraumatic Eyes:  fundi unremarkable, without vessel changes, exudates, hemorrhages or papilledema. Neck: supple, no paraspinal  tenderness, full range of motion Back: No paraspinal tenderness Heart: regular rate and rhythm Lungs: Clear to auscultation bilaterally. Vascular: No carotid bruits. Neurological Exam: Mental status: alert and oriented to person, place, and time, recent and remote memory intact, fund of knowledge intact, attention and concentration intact, speech fluent and not dysarthric, language intact. Cranial nerves: CN I: not tested CN II: pupils equal, round and reactive to light, visual fields intact, fundi unremarkable, without vessel changes, exudates, hemorrhages or papilledema. CN III, IV, VI:  full range of motion, no nystagmus, no ptosis CN V: facial sensation intact CN VII: upper and lower face symmetric CN VIII: hearing intact CN IX, X: gag intact, uvula midline CN XI: sternocleidomastoid and trapezius muscles intact CN XII: tongue midline Bulk & Tone: normal, no fasciculations. Motor:  5/5 throughout.  No resting tremor.  No bradykinesia. Sensation:  Reduced temperature sensation in feet.  Vibration intact. Deep Tendon Reflexes:  2+ throughout, toes downgoing. Finger to nose  testing:  Very mild fine postural tremor bilaterally.  No dysmetria. Gait:  Normal station and stride.  Normal arm swing.  Turns in 4 steps.  Able to walk in tandem with mild difficulty. Romberg negative.  Retropulsion negative.  IMPRESSION: Memory deficits.  Very mild. Essential tremor Restless leg I don't appreciate symptoms that would correlate with diagnosis of Parkinson's disease  PLAN: 1.  Recommend formal neuropsychological testing.   2.  Check CBC, CMP, Mg, ferritin and iron studies, B12, TSH 3.  Consider increasing Mirapex to 1mg  at bedtime 4.  Follow up after testing.  Thank you for allowing me to take part in the care of this patient.  Metta Clines, DO  CC:  Lyman Bishop, MD  Bernerd Limbo, MD

## 2014-02-27 NOTE — Patient Instructions (Signed)
1.  Check blood work 2.  Refer for neuropsychological testing at [place] 3.  Follow up after test.

## 2014-03-02 ENCOUNTER — Other Ambulatory Visit: Payer: Self-pay | Admitting: Neurology

## 2014-03-03 LAB — CBC WITH DIFFERENTIAL/PLATELET
BASOS ABS: 0.1 10*3/uL (ref 0.0–0.1)
BASOS PCT: 2 % — AB (ref 0–1)
EOS ABS: 0.7 10*3/uL (ref 0.0–0.7)
Eosinophils Relative: 12 % — ABNORMAL HIGH (ref 0–5)
HEMATOCRIT: 42.8 % (ref 39.0–52.0)
Hemoglobin: 14.3 g/dL (ref 13.0–17.0)
Lymphocytes Relative: 23 % (ref 12–46)
Lymphs Abs: 1.4 10*3/uL (ref 0.7–4.0)
MCH: 29.9 pg (ref 26.0–34.0)
MCHC: 33.4 g/dL (ref 30.0–36.0)
MCV: 89.4 fL (ref 78.0–100.0)
MONOS PCT: 10 % (ref 3–12)
MPV: 10.5 fL (ref 8.6–12.4)
Monocytes Absolute: 0.6 10*3/uL (ref 0.1–1.0)
NEUTROS PCT: 53 % (ref 43–77)
Neutro Abs: 3.3 10*3/uL (ref 1.7–7.7)
PLATELETS: 280 10*3/uL (ref 150–400)
RBC: 4.79 MIL/uL (ref 4.22–5.81)
RDW: 13.6 % (ref 11.5–15.5)
WBC: 6.2 10*3/uL (ref 4.0–10.5)

## 2014-03-03 LAB — TSH: TSH: 1.218 u[IU]/mL (ref 0.350–4.500)

## 2014-03-03 LAB — COMPREHENSIVE METABOLIC PANEL
ALK PHOS: 71 U/L (ref 39–117)
ALT: 17 U/L (ref 0–53)
AST: 19 U/L (ref 0–37)
Albumin: 4.4 g/dL (ref 3.5–5.2)
BUN: 27 mg/dL — ABNORMAL HIGH (ref 6–23)
CO2: 27 meq/L (ref 19–32)
Calcium: 9.5 mg/dL (ref 8.4–10.5)
Chloride: 105 mEq/L (ref 96–112)
Creat: 1.21 mg/dL (ref 0.50–1.35)
GLUCOSE: 89 mg/dL (ref 70–99)
POTASSIUM: 4.7 meq/L (ref 3.5–5.3)
Sodium: 141 mEq/L (ref 135–145)
Total Bilirubin: 0.8 mg/dL (ref 0.2–1.2)
Total Protein: 7.2 g/dL (ref 6.0–8.3)

## 2014-03-03 LAB — IRON AND TIBC
%SAT: 46 % (ref 20–55)
Iron: 145 ug/dL (ref 42–165)
TIBC: 315 ug/dL (ref 215–435)
UIBC: 170 ug/dL (ref 125–400)

## 2014-03-03 LAB — VITAMIN B12: Vitamin B-12: 562 pg/mL (ref 211–911)

## 2014-03-03 LAB — MAGNESIUM: Magnesium: 2.1 mg/dL (ref 1.5–2.5)

## 2014-03-09 ENCOUNTER — Telehealth: Payer: Self-pay | Admitting: *Deleted

## 2014-03-09 NOTE — Telephone Encounter (Signed)
-----   Message from Dudley Major, DO sent at 03/08/2014 11:27 AM EST ----- Blood work looks okay ----- Message -----    From: Lab in [place] Interface    Sent: 03/03/2014   7:04 AM      To: Dudley Major, DO

## 2014-03-09 NOTE — Telephone Encounter (Signed)
Patient is aware of normal bloodwork

## 2014-04-21 ENCOUNTER — Other Ambulatory Visit (HOSPITAL_COMMUNITY): Payer: Self-pay | Admitting: Internal Medicine

## 2014-04-21 DIAGNOSIS — Z95828 Presence of other vascular implants and grafts: Secondary | ICD-10-CM

## 2014-04-27 ENCOUNTER — Encounter: Payer: Self-pay | Admitting: Internal Medicine

## 2014-04-30 ENCOUNTER — Encounter (HOSPITAL_COMMUNITY): Payer: Medicare HMO

## 2014-05-07 ENCOUNTER — Ambulatory Visit (HOSPITAL_COMMUNITY)
Admission: RE | Admit: 2014-05-07 | Discharge: 2014-05-07 | Disposition: A | Discharge status: Home / Self Care | Payer: Medicare HMO | Source: Ambulatory Visit | Attending: Cardiology | Admitting: Cardiology

## 2014-05-07 DIAGNOSIS — I739 Peripheral vascular disease, unspecified: Secondary | ICD-10-CM | POA: Diagnosis not present

## 2014-05-07 DIAGNOSIS — Z95828 Presence of other vascular implants and grafts: Secondary | ICD-10-CM

## 2014-05-07 DIAGNOSIS — Z48812 Encounter for surgical aftercare following surgery on the circulatory system: Secondary | ICD-10-CM | POA: Insufficient documentation

## 2014-05-07 NOTE — Progress Notes (Signed)
Arterial Duplex Lower Ext. Completed. Diarra Kos, BS, RDMS, RVT  

## 2014-09-14 ENCOUNTER — Other Ambulatory Visit: Payer: Self-pay | Admitting: Internal Medicine

## 2014-09-15 NOTE — Telephone Encounter (Signed)
REFILL 

## 2014-09-30 ENCOUNTER — Telehealth: Payer: Self-pay | Admitting: Internal Medicine

## 2014-10-02 NOTE — Telephone Encounter (Signed)
Close encounter 

## 2014-10-28 ENCOUNTER — Ambulatory Visit (INDEPENDENT_AMBULATORY_CARE_PROVIDER_SITE_OTHER): Payer: Medicare HMO | Admitting: Internal Medicine

## 2014-10-28 ENCOUNTER — Encounter: Payer: Self-pay | Admitting: Internal Medicine

## 2014-10-28 VITALS — BP 122/72 | HR 77 | Ht 70.0 in | Wt 187.3 lb

## 2014-10-28 DIAGNOSIS — I493 Ventricular premature depolarization: Secondary | ICD-10-CM

## 2014-10-28 DIAGNOSIS — Z9989 Dependence on other enabling machines and devices: Secondary | ICD-10-CM

## 2014-10-28 DIAGNOSIS — I10 Essential (primary) hypertension: Secondary | ICD-10-CM | POA: Diagnosis not present

## 2014-10-28 DIAGNOSIS — I739 Peripheral vascular disease, unspecified: Secondary | ICD-10-CM

## 2014-10-28 DIAGNOSIS — G4733 Obstructive sleep apnea (adult) (pediatric): Secondary | ICD-10-CM

## 2014-10-28 DIAGNOSIS — I4892 Unspecified atrial flutter: Secondary | ICD-10-CM | POA: Insufficient documentation

## 2014-10-28 DIAGNOSIS — I483 Typical atrial flutter: Secondary | ICD-10-CM | POA: Diagnosis not present

## 2014-10-28 MED ORDER — APIXABAN 5 MG PO TABS: 5.0000 mg | ORAL_TABLET | Freq: Two times a day (BID) | ORAL | Status: DC

## 2014-10-28 MED ORDER — METOPROLOL SUCCINATE ER 25 MG PO TB24: 25.0000 mg | ORAL_TABLET | Freq: Every day | ORAL | Status: DC

## 2014-10-28 NOTE — Progress Notes (Signed)
OFFICE NOTE  Chief Complaint:  Recent chest discomfort and fatigue  Primary Care Physician: Phineas Inches, MD  HPI:  Carl Harper is a pleasant 80 year old male previously followed Dr. Rollene Fare with a history of peripheral arterial disease. He underwent diamondback orbital rotational atherectomy by Dr. Gwenlyn Found in 2013 with stenting of the calcified right common iliac stenosis. He has mild nonobstructive coronary disease by cath in 2001 and a negative Myoview in 2011. He also has obstructive sleep apnea on CPAP has had reflux symptoms and atypical chest pain from time to time.  His echocardiogram does show mild concentric LVH and borderline aortic root dilatation with a possible small ascending aortic aneurysm which will need to be followed. At his last visit with Dr. Rollene Fare his Toprol was cut down to 25 mg alternating with 12.5 mg every 2 some bradycardia.   Carl Harper was recently seen by Dr. Percival Spanish in the office for shortness of breath and palpitations as well as exertional dyspnea. He wore the monitor however that was never performed as no monitors were available. He did undergo an exercise tolerance test for which he exercised for 6 minutes and 7 metabolic equivalents. There was no evidence of ischemia. He did have PVCs and a fairly flat blood pressure response to exercise. There was marked dyspnea with exertion. He also is reported recent tremors, some memory loss and instability with his gait.  Carl Harper returns today and is complaining of a sinking spell his chest. When pressed further he felt like he was possibly presyncopal occasionally has trouble getting his breath. He has some occasional dizziness. He was evaluated for monitor however none was placed because there was not a monitor available. He did have his metoprolol increased to 12.5/25 mg every other day. He seems to think that this is helped his symptoms and feels better than he had when he saw Dr. Percival Spanish.  I saw  Carl Harper back in the office today. He says that he call and make an appointment about a month ago because his been feeling some episodes of chest discomfort in the morning. He's also felt somewhat short of breath. An EKG in the office today demonstrates new onset atrial flutter with ventricular bigeminy at a rate of 77. This is a new diagnosis for him. I spent greater than 10 minutes discussing atrial fibrillation and anticoagulation options with him. He understands his options and the need for trying to establish a sinus rhythm.  PMHx:  Past Medical History  Diagnosis Date  . Hypertension   . PAD (peripheral artery disease)   . Sleep apnea     dx'd; wore mask; lost weight; turned in machine"  . Dysrhythmia     "irregular"  . Daily headache   . Benign neoplasm of colon 10/24/2006  . Diverticulosis 09/03.2008  . Internal hemorrhoids without mention of complication 20/94/7096  . Restless leg syndrome   . BPH (benign prostatic hyperplasia)   . Memory loss     Past Surgical History  Procedure Laterality Date  . Iliac artery stent Right 11/07/2011    Diamondback orbital rotational atherectomy, PTA & stenting of calcified R CIA (Dr. Adora Fridge)  . Shoulder arthroscopy w/ rotator cuff repair Left 2010  . Appendectomy  ~ 1954  . Cataract extraction w/ intraocular lens  implant, bilateral  2011-2012  . Cardiac catheterization  03/30/1999    non-critical CAD  . Sleep study  01/09/2011    AHI during total sleep 14.4/hr and during REM 19.4/hr  .  Transthoracic echocardiogram  09/2012    EF 55-60%, LA mildly dilated  . Nm myocar perf wall motion  09/2012    lexiscan myoview - low risk with fixed inferior defect with underlying bowel attenuation suggestive of artifact  . Lower extremity angiogram N/A 11/07/2011    Procedure: LOWER EXTREMITY ANGIOGRAM;  Surgeon: Lorretta Harp, MD;  Location: [facility] CATH LAB;  Service: Cardiovascular;  Laterality: N/A;  . Atherectomy Right 11/07/2011    Procedure:  ATHERECTOMY;  Surgeon: Lorretta Harp, MD;  Location: [facility] CATH LAB;  Service: Cardiovascular;  Laterality: Right;  . Percutaneous stent intervention  11/07/2011    Procedure: PERCUTANEOUS STENT INTERVENTION;  Surgeon: Lorretta Harp, MD;  Location: [facility] CATH LAB;  Service: Cardiovascular;;    FAMHx:  Family History  Problem Relation Age of Onset  . Heart Problems Father 62  . Cancer Brother 30  . Heart failure Brother   . Cancer Maternal Grandfather     prostate    SOCHx:   reports that he quit smoking about 8 years ago. His smoking use included Cigarettes. He has a 55 pack-year smoking history. He has never used smokeless tobacco. He reports that he drinks about 8.4 oz of alcohol per week. He reports that he does not use illicit drugs.  ALLERGIES:  Allergies  Allergen Reactions  . Bee Venom     ROS: A comprehensive review of systems was negative except for: Cardiovascular: positive for dyspnea, fatigue and palpitations Neurological: positive for gait problems, memory problems and tremors  HOME MEDS: Current Outpatient Prescriptions  Medication Sig Dispense Refill  . aspirin 81 MG tablet Take 81 mg by mouth daily.    Marland Kitchen EVENING PRIMROSE OIL PO Take 1,300 mg by mouth daily.    Marland Kitchen losartan (COZAAR) 50 MG tablet Take 1 tablet (50 mg total) by mouth daily. 90 tablet 2  . oxybutynin (DITROPAN XL) 15 MG 24 hr tablet Take 1 tablet by mouth daily.    . pantoprazole (PROTONIX) 40 MG tablet Take 1 tablet (40 mg total) by mouth daily. 90 tablet 2  . pramipexole (MIRAPEX) 0.75 MG tablet Take 1 tablet by mouth daily.    . Tamsulosin HCl (FLOMAX) 0.4 MG CAPS Take 0.4 mg by mouth daily after supper.    . zolpidem (AMBIEN CR) 6.25 MG CR tablet Take 12.5 mg by mouth at bedtime as needed.    Marland Kitchen apixaban (ELIQUIS) 5 MG TABS tablet Take 1 tablet (5 mg total) by mouth 2 (two) times daily. 60 tablet 6  . metoprolol succinate (TOPROL-XL) 25 MG 24 hr tablet Take 1 tablet (25 mg total) by mouth daily. 30  tablet 6  . RESTASIS 0.05 % ophthalmic emulsion Apply 1 drop to eye 2 (two) times daily.     No current facility-administered medications for this visit.    LABS/IMAGING: No results found for this or any previous visit (from the past 48 hour(s)). No results found.  VITALS: BP 122/72 mmHg  Pulse 77  Ht 5\' 10"  (1.778 m)  Wt 187 lb 4.8 oz (84.959 kg)  BMI 26.87 kg/m2  EXAM: General appearance: alert and no distress Neck: no carotid bruit and no JVD Lungs: clear to auscultation bilaterally Heart: irregularly irregular rhythm Abdomen: soft, non-tender; bowel sounds normal; no masses,  no organomegaly Extremities: extremities normal, atraumatic, no cyanosis or edema Pulses: 2+ and symmetric Skin: Skin color, texture, turgor normal. No rashes or lesions Neurologic: Grossly normal Psych: Mood, affect normal  EKG: Atrial flutter with variable  AV block and bigeminal PVCs  ASSESSMENT: 1. New onset atrial flutter with frequent PVC's 2. PAD status post diamondback were orbital atherectomy and stenting of the right common iliac artery 3. Hypertension 4. Dyslipidemia 5. Mild CAD 6. Obstructive sleep apnea on CPAP 7. DOE - negative treadmill stress test 8. Tremor/memory loss/gait abnormalities - no evidence of Parkinson's by recent neurology consult  PLAN: 1.   Mr. Wolfinger has been having some progressive shortness of breath with exertion. He is also fatigue and has had some a.m. chest pain. His EKG today shows either coarse A. fib or new onset atrial flutter with frequent PVCs. He will need to be on anticoagulation and I recommend he stop Plavix and continue low-dose aspirin and start on Eliquis 5 mg twice a day. Options including TEE cardioversion and one month of anticoagulation followed by cardioversion and he prefers to take this route. We also discussed his sleep apnea as he is not compliant with CPAP. He says that his wife tells him he does not snore although he has trouble  sleeping at night and does feel fatigued during the day. We may need to consider repeat sleep study, only if he is willing to use CPAP.  Plan to see him back in one month and if he remains in A. fib flutter at that time will consider cardioversion.  Pixie Casino, MD, [facility] Attending Cardiologist [place] 10/28/2014, 5:53 PM

## 2014-10-28 NOTE — Patient Instructions (Addendum)
Medication Instructions:   - STOP PLAVIX - STAY ON ASPIRIN - TAKE metoprolol succinate 25mg  DAILY - START ELIQUIS - take one tablet by mouth twice daily  Labwork:  NONE  Testing/Procedures:  NONE  Follow-Up:  1 month with Dr. Debara Pickett  Any Other Special Instructions Will Be Listed Below (If Applicable).

## 2014-10-29 ENCOUNTER — Emergency Department (HOSPITAL_COMMUNITY): Payer: Medicare HMO

## 2014-10-29 ENCOUNTER — Telehealth: Payer: Self-pay | Admitting: Internal Medicine

## 2014-10-29 ENCOUNTER — Encounter (HOSPITAL_COMMUNITY): Payer: Self-pay

## 2014-10-29 ENCOUNTER — Emergency Department (HOSPITAL_COMMUNITY)
Admission: EM | Admit: 2014-10-29 | Discharge: 2014-10-29 | Disposition: A | Discharge status: Home / Self Care | Payer: Medicare HMO | Attending: Emergency Medicine | Admitting: Emergency Medicine

## 2014-10-29 DIAGNOSIS — Z8719 Personal history of other diseases of the digestive system: Secondary | ICD-10-CM | POA: Insufficient documentation

## 2014-10-29 DIAGNOSIS — G459 Transient cerebral ischemic attack, unspecified: Secondary | ICD-10-CM

## 2014-10-29 DIAGNOSIS — N400 Enlarged prostate without lower urinary tract symptoms: Secondary | ICD-10-CM | POA: Diagnosis not present

## 2014-10-29 DIAGNOSIS — Z7982 Long term (current) use of aspirin: Secondary | ICD-10-CM | POA: Insufficient documentation

## 2014-10-29 DIAGNOSIS — G2581 Restless legs syndrome: Secondary | ICD-10-CM | POA: Insufficient documentation

## 2014-10-29 DIAGNOSIS — Z79899 Other long term (current) drug therapy: Secondary | ICD-10-CM | POA: Diagnosis not present

## 2014-10-29 DIAGNOSIS — I10 Essential (primary) hypertension: Secondary | ICD-10-CM | POA: Insufficient documentation

## 2014-10-29 DIAGNOSIS — G4730 Sleep apnea, unspecified: Secondary | ICD-10-CM | POA: Diagnosis not present

## 2014-10-29 DIAGNOSIS — Z87891 Personal history of nicotine dependence: Secondary | ICD-10-CM | POA: Insufficient documentation

## 2014-10-29 DIAGNOSIS — Z86018 Personal history of other benign neoplasm: Secondary | ICD-10-CM | POA: Diagnosis not present

## 2014-10-29 DIAGNOSIS — R259 Unspecified abnormal involuntary movements: Secondary | ICD-10-CM | POA: Diagnosis present

## 2014-10-29 LAB — I-STAT CHEM 8, ED
BUN: 29 mg/dL — AB (ref 6–20)
CHLORIDE: 107 mmol/L (ref 101–111)
CREATININE: 1.4 mg/dL — AB (ref 0.61–1.24)
Calcium, Ion: 1.23 mmol/L (ref 1.13–1.30)
Glucose, Bld: 91 mg/dL (ref 65–99)
HEMATOCRIT: 41 % (ref 39.0–52.0)
Hemoglobin: 13.9 g/dL (ref 13.0–17.0)
Potassium: 4.1 mmol/L (ref 3.5–5.1)
SODIUM: 144 mmol/L (ref 135–145)
TCO2: 23 mmol/L (ref 0–100)

## 2014-10-29 LAB — COMPREHENSIVE METABOLIC PANEL
ALBUMIN: 4.3 g/dL (ref 3.5–5.0)
ALK PHOS: 54 U/L (ref 38–126)
ALT: 25 U/L (ref 17–63)
ANION GAP: 9 (ref 5–15)
AST: 26 U/L (ref 15–41)
BILIRUBIN TOTAL: 0.6 mg/dL (ref 0.3–1.2)
BUN: 25 mg/dL — AB (ref 6–20)
CALCIUM: 9.4 mg/dL (ref 8.9–10.3)
CO2: 24 mmol/L (ref 22–32)
Chloride: 109 mmol/L (ref 101–111)
Creatinine, Ser: 1.36 mg/dL — ABNORMAL HIGH (ref 0.61–1.24)
GFR calc Af Amer: 56 mL/min — ABNORMAL LOW (ref >60)
GFR calc non Af Amer: 48 mL/min — ABNORMAL LOW (ref >60)
GLUCOSE: 95 mg/dL (ref 65–99)
Potassium: 4.2 mmol/L (ref 3.5–5.1)
SODIUM: 142 mmol/L (ref 135–145)
TOTAL PROTEIN: 6.9 g/dL (ref 6.5–8.1)

## 2014-10-29 LAB — CBC
HCT: 39.5 % (ref 39.0–52.0)
HEMOGLOBIN: 13 g/dL (ref 13.0–17.0)
MCH: 30 pg (ref 26.0–34.0)
MCHC: 32.9 g/dL (ref 30.0–36.0)
MCV: 91 fL (ref 78.0–100.0)
PLATELETS: 219 10*3/uL (ref 150–400)
RBC: 4.34 MIL/uL (ref 4.22–5.81)
RDW: 13.6 % (ref 11.5–15.5)
WBC: 6.9 10*3/uL (ref 4.0–10.5)

## 2014-10-29 LAB — DIFFERENTIAL
BASOS ABS: 0.1 10*3/uL (ref 0.0–0.1)
Basophils Relative: 1 % (ref 0–1)
Eosinophils Absolute: 0.8 10*3/uL — ABNORMAL HIGH (ref 0.0–0.7)
Eosinophils Relative: 12 % — ABNORMAL HIGH (ref 0–5)
LYMPHS ABS: 1.4 10*3/uL (ref 0.7–4.0)
LYMPHS PCT: 20 % (ref 12–46)
Monocytes Absolute: 0.6 10*3/uL (ref 0.1–1.0)
Monocytes Relative: 8 % (ref 3–12)
NEUTROS ABS: 4.1 10*3/uL (ref 1.7–7.7)
NEUTROS PCT: 59 % (ref 43–77)

## 2014-10-29 LAB — PROTIME-INR
INR: 1.22 (ref 0.00–1.49)
PROTHROMBIN TIME: 15.5 s — AB (ref 11.6–15.2)

## 2014-10-29 LAB — I-STAT TROPONIN, ED: Troponin i, poc: 0.01 ng/mL (ref 0.00–0.08)

## 2014-10-29 LAB — ETHANOL: Alcohol, Ethyl (B): 14 mg/dL — ABNORMAL HIGH (ref <5)

## 2014-10-29 LAB — APTT: APTT: 31 s (ref 24–37)

## 2014-10-29 NOTE — ED Notes (Signed)
Patient transported to CT 

## 2014-10-29 NOTE — ED Notes (Signed)
Pt presents with onset of uncontrollable R arm movements while at lunch today at 1200.  Pt reports no facial droop, slurred speech or difficulty with R leg; symptoms lasted 10-15 minutes.

## 2014-10-29 NOTE — Discharge Instructions (Signed)
Transient Ischemic Attack  A transient ischemic attack (TIA) is a "warning stroke" that causes stroke-like symptoms. Unlike a stroke, a TIA does not cause permanent damage to the brain. The symptoms of a TIA can happen very fast and do not last long. It is important to know the symptoms of a TIA and what to do. This can help prevent a major stroke or death.  CAUSES   · A TIA is caused by a temporary blockage in an artery in the brain or neck (carotid artery). The blockage does not allow the brain to get the blood supply it needs and can cause different symptoms. The blockage can be caused by either:  ¨ A blood clot.  ¨ Fatty buildup (plaque) in a neck or brain artery.  RISK FACTORS  · High blood pressure (hypertension).  · High cholesterol.  · Diabetes mellitus.  · Heart disease.  · The build up of plaque in the blood vessels (peripheral artery disease or atherosclerosis).  · The build up of plaque in the blood vessels providing blood and oxygen to the brain (carotid artery stenosis).  · An abnormal heart rhythm (atrial fibrillation).  · Obesity.  · Smoking.  · Taking oral contraceptives (especially in combination with smoking).  · Physical inactivity.  · A diet high in fats, salt (sodium), and calories.  · Alcohol use.  · Use of illegal drugs (especially cocaine and methamphetamine).  · Being male.  · Being African American.  · Being over the age of 55.  · Family history of stroke.  · Previous history of blood clots, stroke, TIA, or heart attack.  · Sickle cell disease.  SYMPTOMS   TIA symptoms are the same as a stroke but are temporary. These symptoms usually develop suddenly, or may be newly present upon awakening from sleep:  · Sudden weakness or numbness of the face, arm, or leg, especially on one side of the body.  · Sudden trouble walking or difficulty moving arms or legs.  · Sudden confusion.  · Sudden personality changes.  · Trouble speaking (aphasia) or understanding.  · Difficulty swallowing.  · Sudden  trouble seeing in one or both eyes.  · Double vision.  · Dizziness.  · Loss of balance or coordination.  · Sudden severe headache with no known cause.  · Trouble reading or writing.  · Loss of bowel or bladder control.  · Loss of consciousness.  DIAGNOSIS   Your caregiver may be able to determine the presence or absence of a TIA based on your symptoms, history, and physical exam. Computed tomography (CT scan) of the brain is usually performed to help identify a TIA. Other tests may be done to diagnose a TIA. These tests may include:  · Electrocardiography.  · Continuous heart monitoring.  · Echocardiography.  · Carotid ultrasonography.  · Magnetic resonance imaging (MRI).  · A scan of the brain circulation.  · Blood tests.  PREVENTION   The risk of a TIA can be decreased by appropriately treating high blood pressure, high cholesterol, diabetes, heart disease, and obesity and by quitting smoking, limiting alcohol, and staying physically active.  TREATMENT   Time is of the essence. Since the symptoms of TIA are the same as a stroke, it is important to seek treatment as soon as possible because you may need a medicine to dissolve the clot (thrombolytic) that cannot be given if too much time has passed. Treatment options vary. Treatment options may include rest, oxygen, intravenous (IV) fluids,   and medicines to thin the blood (anticoagulants). Medicines and diet may be used to address diabetes, high blood pressure, and other risk factors. Measures will be taken to prevent short-term and long-term complications, including infection from breathing foreign material into the lungs (aspiration pneumonia), blood clots in the legs, and falls. Treatment options include procedures to either remove plaque in the carotid arteries or dilate carotid arteries that have narrowed due to plaque. Those procedures are:  · Carotid endarterectomy.  · Carotid angioplasty and stenting.  HOME CARE INSTRUCTIONS   · Take all medicines prescribed  by your caregiver. Follow the directions carefully. Medicines may be used to control risk factors for a stroke. Be sure you understand all your medicine instructions.  · You may be told to take aspirin or the anticoagulant warfarin. Warfarin needs to be taken exactly as instructed.  ¨ Taking too much or too little warfarin is dangerous. Too much warfarin increases the risk of bleeding. Too little warfarin continues to allow the risk for blood clots. While taking warfarin, you will need to have regular blood tests to measure your blood clotting time. A PT blood test measures how long it takes for blood to clot. Your PT is used to calculate another value called an INR. Your PT and INR help your caregiver to adjust your dose of warfarin. The dose can change for many reasons. It is critically important that you take warfarin exactly as prescribed.  ¨ Many foods, especially foods high in vitamin K can interfere with warfarin and affect the PT and INR. Foods high in vitamin K include spinach, kale, broccoli, cabbage, collard and turnip greens, brussels sprouts, peas, cauliflower, seaweed, and parsley as well as beef and pork liver, green tea, and soybean oil. You should eat a consistent amount of foods high in vitamin K. Avoid major changes in your diet, or notify your caregiver before changing your diet. Arrange a visit with a dietitian to answer your questions.  ¨ Many medicines can interfere with warfarin and affect the PT and INR. You must tell your caregiver about any and all medicines you take, this includes all vitamins and supplements. Be especially cautious with aspirin and anti-inflammatory medicines. Do not take or discontinue any prescribed or over-the-counter medicine except on the advice of your caregiver or pharmacist.  ¨ Warfarin can have side effects, such as excessive bruising or bleeding. You will need to hold pressure over cuts for longer than usual. Your caregiver or pharmacist will discuss other  potential side effects.  ¨ Avoid sports or activities that may cause injury or bleeding.  ¨ Be mindful when shaving, flossing your teeth, or handling sharp objects.  ¨ Alcohol can change the body's ability to handle warfarin. It is best to avoid alcoholic drinks or consume only very small amounts while taking warfarin. Notify your caregiver if you change your alcohol intake.  ¨ Notify your dentist or other caregivers before procedures.  · Eat a diet that includes 5 or more servings of fruits and vegetables each day. This may reduce the risk of stroke. Certain diets may be prescribed to address high blood pressure, high cholesterol, diabetes, or obesity.  ¨ A low-sodium, low-saturated fat, low-trans fat, low-cholesterol diet is recommended to manage high blood pressure.  ¨ A low-saturated fat, low-trans fat, low-cholesterol, and high-fiber diet may control cholesterol levels.  ¨ A controlled-carbohydrate, controlled-sugar diet is recommended to manage diabetes.  ¨ A reduced-calorie, low-sodium, low-saturated fat, low-trans fat, low-cholesterol diet is recommended to manage obesity.  ·   Maintain a healthy weight.  · Stay physically active. It is recommended that you get at least 30 minutes of activity on most or all days.  · Do not smoke.  · Limit alcohol use even if you are not taking warfarin. Moderate alcohol use is considered to be:  ¨ No more than 2 drinks each day for men.  ¨ No more than 1 drink each day for nonpregnant women.  · Stop drug abuse.  · Home safety. A safe home environment is important to reduce the risk of falls. Your caregiver may arrange for specialists to evaluate your home. Having grab bars in the bedroom and bathroom is often important. Your caregiver may arrange for equipment to be used at home, such as raised toilets and a seat for the shower.  · Follow all instructions for follow-up with your caregiver. This is very important. This includes any referrals and lab tests. Proper follow up can  prevent a stroke or another TIA from occurring.  SEEK MEDICAL CARE IF:  · You have personality changes.  · You have difficulty swallowing.  · You are seeing double.  · You have dizziness.  · You have a fever.  · You have skin breakdown.  SEEK IMMEDIATE MEDICAL CARE IF:   Any of these symptoms may represent a serious problem that is an emergency. Do not wait to see if the symptoms will go away. Get medical help right away. Call your local emergency services (911 in [place] Do not drive yourself to the hospital.  · You have sudden weakness or numbness of the face, arm, or leg, especially on one side of the body.  · You have sudden trouble walking or difficulty moving arms or legs.  · You have sudden confusion.  · You have trouble speaking (aphasia) or understanding.  · You have sudden trouble seeing in one or both eyes.  · You have a loss of balance or coordination.  · You have a sudden, severe headache with no known cause.  · You have new chest pain or an irregular heartbeat.  · You have a partial or total loss of consciousness.  MAKE SURE YOU:   · Understand these instructions.  · Will watch your condition.  · Will get help right away if you are not doing well or get worse.  Document Released: 11/16/2004 Document Revised: 02/11/2013 Document Reviewed: 05/14/2013  [employer]® Patient Information ©2015 [employer]. This information is not intended to replace advice given to you by your health care provider. Make sure you discuss any questions you have with your health care provider.

## 2014-10-29 NOTE — ED Provider Notes (Signed)
CSN: [id number]     Arrival date & time 10/29/14  1351 History   First MD Initiated Contact with Patient 10/29/14 1413     No chief complaint on file.    (Consider location/radiation/quality/duration/timing/severity/associated sxs/prior Treatment) HPI Comments: Patient presents to the ER for evaluation of uncontrolled right arm movements. Patient reports that around noon today he had sudden onset of uncontrollable movements of his right arm. He reports that when he tried to move his arm he had no idea what was going to do, could not get it to do any of the emotions he was trying to make happen. This lasted for approximately 15 minutes and then resolved. This has never happened before. Patient was concerned because he was just switched from Plavix to request because of atrial fibrillation. No associated headache, vision change, chest pain, shortness of breath.   Past Medical History  Diagnosis Date  . Hypertension   . PAD (peripheral artery disease)   . Sleep apnea     dx'd; wore mask; lost weight; turned in machine"  . Dysrhythmia     "irregular"  . Daily headache   . Benign neoplasm of colon 10/24/2006  . Diverticulosis 09/03.2008  . Internal hemorrhoids without mention of complication 16/60/6004  . Restless leg syndrome   . BPH (benign prostatic hyperplasia)   . Memory loss    Past Surgical History  Procedure Laterality Date  . Iliac artery stent Right 11/07/2011    Diamondback orbital rotational atherectomy, PTA & stenting of calcified R CIA (Dr. Adora Fridge)  . Shoulder arthroscopy w/ rotator cuff repair Left 2010  . Appendectomy  ~ 1954  . Cataract extraction w/ intraocular lens  implant, bilateral  2011-2012  . Cardiac catheterization  03/30/1999    non-critical CAD  . Sleep study  01/09/2011    AHI during total sleep 14.4/hr and during REM 19.4/hr  . Transthoracic echocardiogram  09/2012    EF 55-60%, LA mildly dilated  . Nm myocar perf wall motion  09/2012    lexiscan myoview  - low risk with fixed inferior defect with underlying bowel attenuation suggestive of artifact  . Lower extremity angiogram N/A 11/07/2011    Procedure: LOWER EXTREMITY ANGIOGRAM;  Surgeon: Lorretta Harp, MD;  Location: [facility] CATH LAB;  Service: Cardiovascular;  Laterality: N/A;  . Atherectomy Right 11/07/2011    Procedure: ATHERECTOMY;  Surgeon: Lorretta Harp, MD;  Location: [facility] CATH LAB;  Service: Cardiovascular;  Laterality: Right;  . Percutaneous stent intervention  11/07/2011    Procedure: PERCUTANEOUS STENT INTERVENTION;  Surgeon: Lorretta Harp, MD;  Location: [facility] CATH LAB;  Service: Cardiovascular;;   Family History  Problem Relation Age of Onset  . Heart Problems Father 68  . Cancer Brother 39  . Heart failure Brother   . Cancer Maternal Grandfather     prostate   Social History  Substance Use Topics  . Smoking status: Former Smoker -- 1.00 packs/day for 55 years    Types: Cigarettes    Quit date: 02/20/2006  . Smokeless tobacco: Never Used  . Alcohol Use: 8.4 oz/week    14 Glasses of wine per week     Comment: 11/07/2011 "couple drinks of wine q night"    Review of Systems  Respiratory: Negative for shortness of breath.   Cardiovascular: Negative for chest pain.  Neurological: Negative for numbness and headaches.       Uncontrollable right arm movement  All other systems reviewed and are negative.  Allergies  Bee venom  Home Medications   Prior to Admission medications   Medication Sig Start Date End Date Taking? Authorizing Provider  apixaban (ELIQUIS) 5 MG TABS tablet Take 1 tablet (5 mg total) by mouth 2 (two) times daily. 10/28/14   Pixie Casino, MD  aspirin 81 MG tablet Take 81 mg by mouth daily.    Historical Provider, MD  EVENING PRIMROSE OIL PO Take 1,300 mg by mouth daily.    Historical Provider, MD  losartan (COZAAR) 50 MG tablet Take 1 tablet (50 mg total) by mouth daily. 02/25/14   Pixie Casino, MD  metoprolol succinate (TOPROL-XL) 25 MG 24 hr  tablet Take 1 tablet (25 mg total) by mouth daily. 10/28/14   Pixie Casino, MD  oxybutynin (DITROPAN XL) 15 MG 24 hr tablet Take 1 tablet by mouth daily. 12/09/13   Historical Provider, MD  pantoprazole (PROTONIX) 40 MG tablet Take 1 tablet (40 mg total) by mouth daily. 10/30/13   Pixie Casino, MD  pramipexole (MIRAPEX) 0.75 MG tablet Take 1 tablet by mouth daily. 12/01/13   Historical Provider, MD  RESTASIS 0.05 % ophthalmic emulsion Apply 1 drop to eye 2 (two) times daily. 10/23/14   Historical Provider, MD  Tamsulosin HCl (FLOMAX) 0.4 MG CAPS Take 0.4 mg by mouth daily after supper.    Historical Provider, MD  zolpidem (AMBIEN CR) 6.25 MG CR tablet Take 12.5 mg by mouth at bedtime as needed. 09/08/13   Historical Provider, MD   BP 101/55 mmHg  Pulse 62  Temp(Src) 97.7 F (36.5 C)  Resp 16  SpO2 99% Physical Exam  Constitutional: He is oriented to person, place, and time. He appears well-developed and well-nourished. No distress.  HENT:  Head: Normocephalic and atraumatic.  Right Ear: Hearing normal.  Left Ear: Hearing normal.  Nose: Nose normal.  Mouth/Throat: Oropharynx is clear and moist and mucous membranes are normal.  Eyes: Conjunctivae and EOM are normal. Pupils are equal, round, and reactive to light.  Neck: Normal range of motion. Neck supple.  Cardiovascular: Regular rhythm, S1 normal and S2 normal.  Exam reveals no gallop and no friction rub.   No murmur heard. Pulmonary/Chest: Effort normal and breath sounds normal. No respiratory distress. He exhibits no tenderness.  Abdominal: Soft. Normal appearance and bowel sounds are normal. There is no hepatosplenomegaly. There is no tenderness. There is no rebound, no guarding, no tenderness at McBurney's point and negative Murphy's sign. No hernia.  Musculoskeletal: Normal range of motion.  Neurological: He is alert and oriented to person, place, and time. He has normal strength. No cranial nerve deficit or sensory deficit.  Coordination normal. GCS eye subscore is 4. GCS verbal subscore is 5. GCS motor subscore is 6.  Neuro exam  Skin: Skin is warm, dry and intact. No rash noted. No cyanosis.  Psychiatric: He has a normal mood and affect. His speech is normal and behavior is normal. Thought content normal.  Nursing note and vitals reviewed.   ED Course  Procedures (including critical care time) Labs Review Labs Reviewed  I-STAT CHEM 8, ED - Abnormal; Notable for the following:    BUN 29 (*)    Creatinine, Ser 1.40 (*)    All other components within normal limits  ETHANOL  PROTIME-INR  APTT  CBC  DIFFERENTIAL  COMPREHENSIVE METABOLIC PANEL  URINE RAPID DRUG SCREEN, HOSP PERFORMED  URINALYSIS, ROUTINE W REFLEX MICROSCOPIC (NOT AT [facility])  Randolm Idol, ED    Imaging Review  No results found. I have personally reviewed and evaluated these images and lab results as part of my medical decision-making.   EKG Interpretation   Date/Time:  Thursday October 29 2014 14:01:24 EDT Ventricular Rate:  63 PR Interval:  220 QRS Duration: 106 QT Interval:  412 QTC Calculation: 421 R Axis:   -33 Text Interpretation:  Sinus rhythm with 1st degree A-V block with  occasional Premature ventricular complexes Left axis deviation Nonspecific  ST abnormality Abnormal ECG No previous tracing Confirmed by Frantz Quattrone  MD,  Rhylen Pulido [phone]) on 10/29/2014 2:13:56 PM      MDM   Final diagnoses:  None   TIA (subthalamic)  Patient presents to the ER for evaluation of loss of ability to control his right arm that lasted for 15 minutes. He describes violent movements of his arm when he tried to move the arm. This sounds potentially like hemiballismus. Symptoms have resolved and he has completely returned to baseline. Workup unremarkable. Discussed with Dr. Janann Colonel, on-call for neurology. He felt that as the patient has returned to baseline and is already on Eliquis, patient does not require inpatient workup. He felt  that the patient could be safely discharged and follow-up with primary care for further TIA workup. Patient was counseled that he needs to return to the ER immediately if he has any further symptoms.    Orpah Greek, MD 10/29/14 [phone]

## 2014-10-29 NOTE — Telephone Encounter (Signed)
Patient's wife called reporting patient had uncontrolled movement/weakness in right arm persisting for 15-20 minutes. This resolved. She did not note any facial drooping or other weakness. i advised to proceed safely but promptly to ER for stroke r/o. Caller voiced understanding.  Noted recent finding of atrial flutter discussed in OV yesterday. Pt initiated anticoagulation therapy (Eliquis) <24 hrs ago.   Routing to Dr. Debara Pickett for his awareness.

## 2014-10-30 ENCOUNTER — Other Ambulatory Visit: Payer: Self-pay | Admitting: *Deleted

## 2014-10-30 DIAGNOSIS — G459 Transient cerebral ischemic attack, unspecified: Secondary | ICD-10-CM

## 2014-11-02 NOTE — Telephone Encounter (Signed)
Don't think it is related to Eliquis - if it reoccurs, he should go to the ER to be evaluated.  Dr. Lemmie Evens

## 2014-11-06 ENCOUNTER — Ambulatory Visit (HOSPITAL_COMMUNITY)
Admission: RE | Admit: 2014-11-06 | Discharge: 2014-11-06 | Disposition: A | Discharge status: Home / Self Care | Payer: Medicare HMO | Source: Ambulatory Visit | Attending: Cardiovascular Disease | Admitting: Cardiovascular Disease

## 2014-11-06 DIAGNOSIS — G459 Transient cerebral ischemic attack, unspecified: Secondary | ICD-10-CM

## 2014-11-06 DIAGNOSIS — I10 Essential (primary) hypertension: Secondary | ICD-10-CM | POA: Diagnosis not present

## 2014-11-06 DIAGNOSIS — E785 Hyperlipidemia, unspecified: Secondary | ICD-10-CM | POA: Insufficient documentation

## 2014-11-06 DIAGNOSIS — I6523 Occlusion and stenosis of bilateral carotid arteries: Secondary | ICD-10-CM | POA: Diagnosis not present

## 2014-11-09 ENCOUNTER — Other Ambulatory Visit: Payer: Self-pay | Admitting: Internal Medicine

## 2014-11-09 NOTE — Telephone Encounter (Signed)
REFILL 

## 2014-11-27 ENCOUNTER — Encounter: Payer: Self-pay | Admitting: Internal Medicine

## 2014-11-27 ENCOUNTER — Ambulatory Visit (INDEPENDENT_AMBULATORY_CARE_PROVIDER_SITE_OTHER): Payer: Medicare HMO | Admitting: Internal Medicine

## 2014-11-27 VITALS — BP 122/88 | HR 63 | Ht 70.0 in | Wt 190.6 lb

## 2014-11-27 DIAGNOSIS — I493 Ventricular premature depolarization: Secondary | ICD-10-CM

## 2014-11-27 DIAGNOSIS — I483 Typical atrial flutter: Secondary | ICD-10-CM

## 2014-11-27 DIAGNOSIS — R0609 Other forms of dyspnea: Secondary | ICD-10-CM

## 2014-11-27 NOTE — Patient Instructions (Signed)
Dr Debara Pickett recommends that you schedule a follow-up appointment in 6 months. You will receive a reminder letter in the mail two months in advance. If you don't receive a letter, please call our office to schedule the follow-up appointment.

## 2014-11-27 NOTE — Progress Notes (Signed)
OFFICE NOTE  Chief Complaint:  Follow-up  Primary Care Physician: Phineas Inches, MD  HPI:  Carl Harper is a pleasant 80 year old male previously followed Dr. Rollene Fare with a history of peripheral arterial disease. He underwent diamondback orbital rotational atherectomy by Dr. Gwenlyn Found in 2013 with stenting of the calcified right common iliac stenosis. He has mild nonobstructive coronary disease by cath in 2001 and a negative Myoview in 2011. He also has obstructive sleep apnea on CPAP has had reflux symptoms and atypical chest pain from time to time.  His echocardiogram does show mild concentric LVH and borderline aortic root dilatation with a possible small ascending aortic aneurysm which will need to be followed. At his last visit with Dr. Rollene Fare his Toprol was cut down to 25 mg alternating with 12.5 mg every 2 some bradycardia.   Carl Harper was recently seen by Dr. Percival Spanish in the office for shortness of breath and palpitations as well as exertional dyspnea. He wore the monitor however that was never performed as no monitors were available. He did undergo an exercise tolerance test for which he exercised for 6 minutes and 7 metabolic equivalents. There was no evidence of ischemia. He did have PVCs and a fairly flat blood pressure response to exercise. There was marked dyspnea with exertion. He also is reported recent tremors, some memory loss and instability with his gait.  Carl Harper returns today and is complaining of a sinking spell his chest. When pressed further he felt like he was possibly presyncopal occasionally has trouble getting his breath. He has some occasional dizziness. He was evaluated for monitor however none was placed because there was not a monitor available. He did have his metoprolol increased to 12.5/25 mg every other day. He seems to think that this is helped his symptoms and feels better than he had when he saw Dr. Percival Spanish.  I saw Carl Harper back in the office  today. He says that he call and make an appointment about a month ago because his been feeling some episodes of chest discomfort in the morning. He's also felt somewhat short of breath. An EKG in the office today demonstrates new onset atrial flutter with ventricular bigeminy at a rate of 77. This is a new diagnosis for him. I spent greater than 10 minutes discussing atrial fibrillation and anticoagulation options with him. He understands his options and the need for trying to establish a sinus rhythm.  Carl Harper returns today the office for follow-up. His EKG today fortunately shows sinus rhythm with PVCs. This is good news as we will not need to schedule him for a cardioversion. He has been taking Eliquis and feels that this is tolerable. He denies any bleeding problems. Here she does feel improvement in energy and I suspect this is from converting back to sinus rhythm at some point. Based on his TIA event, I suspect it may been related to paroxysmal atrial flutter. He did have carotid Dopplers which show very mild bilateral carotid disease and are not likely the source of his TIA event.  PMHx:  Past Medical History  Diagnosis Date  . Hypertension   . PAD (peripheral artery disease)   . Sleep apnea     dx'd; wore mask; lost weight; turned in machine"  . Dysrhythmia     "irregular"  . Daily headache   . Benign neoplasm of colon 10/24/2006  . Diverticulosis 09/03.2008  . Internal hemorrhoids without mention of complication 40/98/1191  . Restless leg syndrome   .  BPH (benign prostatic hyperplasia)   . Memory loss     Past Surgical History  Procedure Laterality Date  . Iliac artery stent Right 11/07/2011    Diamondback orbital rotational atherectomy, PTA & stenting of calcified R CIA (Dr. Adora Fridge)  . Shoulder arthroscopy w/ rotator cuff repair Left 2010  . Appendectomy  ~ 1954  . Cataract extraction w/ intraocular lens  implant, bilateral  2011-2012  . Cardiac catheterization  03/30/1999      non-critical CAD  . Sleep study  01/09/2011    AHI during total sleep 14.4/hr and during REM 19.4/hr  . Transthoracic echocardiogram  09/2012    EF 55-60%, LA mildly dilated  . Nm myocar perf wall motion  09/2012    lexiscan myoview - low risk with fixed inferior defect with underlying bowel attenuation suggestive of artifact  . Lower extremity angiogram N/A 11/07/2011    Procedure: LOWER EXTREMITY ANGIOGRAM;  Surgeon: Lorretta Harp, MD;  Location: [facility] CATH LAB;  Service: Cardiovascular;  Laterality: N/A;  . Atherectomy Right 11/07/2011    Procedure: ATHERECTOMY;  Surgeon: Lorretta Harp, MD;  Location: [facility] CATH LAB;  Service: Cardiovascular;  Laterality: Right;  . Percutaneous stent intervention  11/07/2011    Procedure: PERCUTANEOUS STENT INTERVENTION;  Surgeon: Lorretta Harp, MD;  Location: [facility] CATH LAB;  Service: Cardiovascular;;    FAMHx:  Family History  Problem Relation Age of Onset  . Heart Problems Father 45  . Cancer Brother 53  . Heart failure Brother   . Cancer Maternal Grandfather     prostate    SOCHx:   reports that he quit smoking about 8 years ago. His smoking use included Cigarettes. He has a 55 pack-year smoking history. He has never used smokeless tobacco. He reports that he drinks about 8.4 oz of alcohol per week. He reports that he does not use illicit drugs.  ALLERGIES:  Allergies  Allergen Reactions  . Bee Venom     ROS: A comprehensive review of systems was negative.  HOME MEDS: Current Outpatient Prescriptions  Medication Sig Dispense Refill  . apixaban (ELIQUIS) 5 MG TABS tablet Take 1 tablet (5 mg total) by mouth 2 (two) times daily. 60 tablet 6  . aspirin 81 MG tablet Take 81 mg by mouth daily.    Marland Kitchen EVENING PRIMROSE OIL PO Take 1,300 mg by mouth daily.    Marland Kitchen losartan (COZAAR) 50 MG tablet Take 1 tablet (50 mg total) by mouth daily. 90 tablet 2  . metoprolol succinate (TOPROL-XL) 25 MG 24 hr tablet Take 1 tablet (25 mg total) by mouth daily.  30 tablet 6  . oxybutynin (DITROPAN XL) 15 MG 24 hr tablet Take 1 tablet by mouth daily.    . pantoprazole (PROTONIX) 40 MG tablet TAKE ONE TABLET   BY MOUTH   DAILY 90 tablet 0  . pramipexole (MIRAPEX) 0.75 MG tablet Take 1 tablet by mouth daily.    . RESTASIS 0.05 % ophthalmic emulsion Apply 1 drop to eye 2 (two) times daily.    . Tamsulosin HCl (FLOMAX) 0.4 MG CAPS Take 0.4 mg by mouth daily after supper.    . zolpidem (AMBIEN CR) 6.25 MG CR tablet Take 12.5 mg by mouth at bedtime as needed for sleep.      No current facility-administered medications for this visit.    LABS/IMAGING: No results found for this or any previous visit (from the past 48 hour(s)). No results found.  VITALS: BP 122/88 mmHg  Pulse 63  Ht 5\' 10"  (1.778 m)  Wt 190 lb 9.6 oz (86.456 kg)  BMI 27.35 kg/m2  EXAM: Deferred  EKG: Sinus rhythm with first-degree AV block and PVCs at 69  ASSESSMENT: 1. New onset atrial flutter with frequent PVC's - CHADSVASC score of 4 on Eliquis 2. PAD status post diamondback were orbital atherectomy and stenting of the right common iliac artery 3. Hypertension 4. Dyslipidemia 5. Mild CAD 6. Obstructive sleep apnea on CPAP 7. DOE - negative treadmill stress test 8. Tremor/memory loss/gait abnormalities - no evidence of Parkinson's by recent neurology consult  PLAN: 1.   Carl Harper seemed to be quite symptomatic related to paroxysmal atrial flutter. Fortunately he is back in sinus rhythm. He will need to remain on Eliquis as well as low-dose aspirin for PAD. He recently had carotid Dopplers which showed a mild amount of bilateral carotid artery disease. I do not think this explains his a TIA episode, rather it may been related to paroxysmal cardiac arrhythmias. Plan to continue his current medications and I'll see him back in 6 months.  Pixie Casino, MD, [facility] Attending Cardiologist [place] 11/27/2014, 3:37 PM

## 2014-12-16 ENCOUNTER — Telehealth: Payer: Self-pay | Admitting: Internal Medicine

## 2014-12-16 NOTE — Telephone Encounter (Signed)
Left message for patient to call.

## 2014-12-16 NOTE — Telephone Encounter (Signed)
Pt is having swelling in both of his ankles and some shortnesst of breath.

## 2014-12-17 NOTE — Telephone Encounter (Signed)
Called pt, notified of Dr. Lysbeth Penner recommendations.  [place] assisted me in setting patient up for flex visit tomorrow at 10:30am w/ Truitt Merle. Called pt back and gave appt information, directions. He verbalized understanding and stated thanks for the assistance.

## 2014-12-17 NOTE — Telephone Encounter (Signed)
Hesitate to add a diuretic over the phone with recently increasing creatinine. May need a flex clinic visit to see if symptoms support CHF, or just PVD (he has PAD) or swelling related to worsening renal failure.  Dr. Lemmie Evens

## 2014-12-17 NOTE — Telephone Encounter (Signed)
Received return call from patient.  He states he has noticed swelling in ankles for about 10 days. He has been short of breath "a couple of times". Most recently about 3-4 days ago. States SOB was mild and didn't last long.  Ankle swelling has been persistent.  He is not currently on a diuretic.  Was seen in office by Dr. Debara Pickett on 10/7.  Will route for advice.

## 2014-12-17 NOTE — Telephone Encounter (Signed)
LMTCB

## 2014-12-18 ENCOUNTER — Encounter: Payer: Self-pay | Admitting: Nurse Practitioner

## 2014-12-18 ENCOUNTER — Ambulatory Visit (INDEPENDENT_AMBULATORY_CARE_PROVIDER_SITE_OTHER): Payer: Medicare HMO | Admitting: Nurse Practitioner

## 2014-12-18 VITALS — BP 158/58 | HR 69 | Ht 70.0 in | Wt 187.6 lb

## 2014-12-18 DIAGNOSIS — R609 Edema, unspecified: Secondary | ICD-10-CM | POA: Diagnosis not present

## 2014-12-18 DIAGNOSIS — I483 Typical atrial flutter: Secondary | ICD-10-CM | POA: Diagnosis not present

## 2014-12-18 DIAGNOSIS — R002 Palpitations: Secondary | ICD-10-CM

## 2014-12-18 LAB — CBC
HCT: 39.1 % (ref 39.0–52.0)
Hemoglobin: 13.2 g/dL (ref 13.0–17.0)
MCH: 30.5 pg (ref 26.0–34.0)
MCHC: 33.8 g/dL (ref 30.0–36.0)
MCV: 90.3 fL (ref 78.0–100.0)
MPV: 10.5 fL (ref 8.6–12.4)
Platelets: 240 10*3/uL (ref 150–400)
RBC: 4.33 MIL/uL (ref 4.22–5.81)
RDW: 13 % (ref 11.5–15.5)
WBC: 6.3 10*3/uL (ref 4.0–10.5)

## 2014-12-18 LAB — BASIC METABOLIC PANEL
BUN: 24 mg/dL (ref 7–25)
CO2: 26 mmol/L (ref 20–31)
Calcium: 9.2 mg/dL (ref 8.6–10.3)
Chloride: 106 mmol/L (ref 98–110)
Creat: 1.25 mg/dL — ABNORMAL HIGH (ref 0.70–1.18)
Glucose, Bld: 105 mg/dL — ABNORMAL HIGH (ref 65–99)
Potassium: 4.5 mmol/L (ref 3.5–5.3)
Sodium: 140 mmol/L (ref 135–146)

## 2014-12-18 NOTE — Patient Instructions (Addendum)
We will be checking the following labs today - BMET and CBC   Medication Instructions:    Continue with your current medicines.     Testing/Procedures To Be Arranged:  Holter  Follow-Up:   See Dr. Debara Pickett as planned - unless your monitor report is abnormal - then he will see you sooner.     Other Special Instructions:   Restrict your salt  Use support stockings/compression stockings - this will help with your swelling  Minimize your sue of Advil/Aleve - this makes your BP go up and makes you swell.     If you need a refill on your cardiac medications before your next appointment, please call your pharmacy.   Call the [place] office at [phone] if you have any questions, problems or concerns.

## 2014-12-18 NOTE — Progress Notes (Signed)
CARDIOLOGY OFFICE NOTE  Date:  12/18/2014    Carl Harper Date of Birth: 1934/04/06 Medical Record #[id number]  PCP:  Phineas Inches, MD  Cardiologist:  [facility]    Chief Complaint  Patient presents with  . Edema    Work in visit - seen for Dr. Debara Pickett    History of Present Illness: Carl Harper is a 80 y.o. male who presents today for a work in visit. Seen for Dr. Debara Pickett. Previously followed Dr. Rollene Fare.   He has a history of peripheral arterial disease. He underwent rotational atherectomy by Dr. Gwenlyn Found in 2013 with stenting of the calcified right common iliac stenosis. He has mild nonobstructive coronary disease by cath in 2001 and a negative Myoview in 2011. He also has obstructive sleep apnea on CPAP has had reflux symptoms and atypical chest pain from time to time. His echocardiogram does show mild concentric LVH and borderline aortic root dilatation with a possible small ascending aortic aneurysm which will need to be followed. He has had his Toprol was cut down due to bradycardia.   Carl Harper was seen by Dr. Percival Spanish back in August of 2015 for shortness of breath and palpitations as well as exertional dyspnea. He was to wear a monitor however that was never performed as no monitors were available. He did undergo an exercise tolerance test for which he exercised for 6 minutes and 7 metabolic equivalents. There was no evidence of ischemia. He did have PVCs and a fairly flat blood pressure response to exercise. There was marked dyspnea with exertion.   He has been seen several times this fall - history is a little hard to follow - was last seen just 2 weeks ago - was having chest pain and short of breath. Noted to be in atrial flutter at prior visit but had converted on last EKG. He is on Eliquis. Concern for recent TIA. Noted to have had stress testing (ordered by Primary Care) at [facility]. This turned out ok.   Comes back today. He is here alone. Says he is here for  some testing ??. Apparently called yesterday with swelling and shortness of breath. Did not want to put on diuretic over the phone without seeing and getting labs. Says this is now better - "seems to be". His history is a little hard to follow. Seems to continue to have some chest pain off and on - this seems chronic. Breathing appears ok. Has been using more NSAID recently. Eats out - getting too much salt. Notes his heart "always skips" and does get dizzy at times. Bp ok at home.   Past Medical History  Diagnosis Date  . Hypertension   . PAD (peripheral artery disease) ([place])   . Sleep apnea     dx'd; wore mask; lost weight; turned in machine"  . Dysrhythmia     "irregular"  . Daily headache   . Benign neoplasm of colon 10/24/2006  . Diverticulosis 09/03.2008  . Internal hemorrhoids without mention of complication 09/38/1829  . Restless leg syndrome   . BPH (benign prostatic hyperplasia)   . Memory loss     Past Surgical History  Procedure Laterality Date  . Iliac artery stent Right 11/07/2011    Diamondback orbital rotational atherectomy, PTA & stenting of calcified R CIA (Dr. Adora Fridge)  . Shoulder arthroscopy w/ rotator cuff repair Left 2010  . Appendectomy  ~ 1954  . Cataract extraction w/ intraocular lens  implant, bilateral  2011-2012  .  Cardiac catheterization  03/30/1999    non-critical CAD  . Sleep study  01/09/2011    AHI during total sleep 14.4/hr and during REM 19.4/hr  . Transthoracic echocardiogram  09/2012    EF 55-60%, LA mildly dilated  . Nm myocar perf wall motion  09/2012    lexiscan myoview - low risk with fixed inferior defect with underlying bowel attenuation suggestive of artifact  . Lower extremity angiogram N/A 11/07/2011    Procedure: LOWER EXTREMITY ANGIOGRAM;  Surgeon: Lorretta Harp, MD;  Location: [facility] CATH LAB;  Service: Cardiovascular;  Laterality: N/A;  . Atherectomy Right 11/07/2011    Procedure: ATHERECTOMY;  Surgeon: Lorretta Harp, MD;  Location:  [facility] CATH LAB;  Service: Cardiovascular;  Laterality: Right;  . Percutaneous stent intervention  11/07/2011    Procedure: PERCUTANEOUS STENT INTERVENTION;  Surgeon: Lorretta Harp, MD;  Location: [facility] CATH LAB;  Service: Cardiovascular;;     Medications: Current Outpatient Prescriptions  Medication Sig Dispense Refill  . apixaban (ELIQUIS) 5 MG TABS tablet Take 1 tablet (5 mg total) by mouth 2 (two) times daily. 60 tablet 6  . aspirin 81 MG tablet Take 81 mg by mouth daily.    Marland Kitchen EVENING PRIMROSE OIL PO Take 1,300 mg by mouth daily.    Marland Kitchen losartan (COZAAR) 50 MG tablet Take 1 tablet (50 mg total) by mouth daily. 90 tablet 2  . metoprolol succinate (TOPROL-XL) 25 MG 24 hr tablet Take 1 tablet (25 mg total) by mouth daily. 30 tablet 6  . oxybutynin (DITROPAN XL) 15 MG 24 hr tablet Take 1 tablet by mouth daily.    . pantoprazole (PROTONIX) 40 MG tablet TAKE ONE TABLET   BY MOUTH   DAILY 90 tablet 0  . pramipexole (MIRAPEX) 0.75 MG tablet Take 1 tablet by mouth daily.    . RESTASIS 0.05 % ophthalmic emulsion Apply 1 drop to eye 2 (two) times daily.    . Tamsulosin HCl (FLOMAX) 0.4 MG CAPS Take 0.4 mg by mouth daily after supper.    . zolpidem (AMBIEN CR) 6.25 MG CR tablet Take 12.5 mg by mouth at bedtime as needed for sleep.      No current facility-administered medications for this visit.    Allergies: Allergies  Allergen Reactions  . Bee Venom     Social History: The patient  reports that he quit smoking about 8 years ago. His smoking use included Cigarettes. He has a 55 pack-year smoking history. He has never used smokeless tobacco. He reports that he drinks about 8.4 oz of alcohol per week. He reports that he does not use illicit drugs.   Family History: The patient's family history includes Cancer in his maternal grandfather; Cancer (age of onset: 77) in his brother; Heart Problems (age of onset: 10) in his father; Heart failure in his brother.   Review of Systems: Please see the  history of present illness.   Otherwise, the review of systems is positive for none.   All other systems are reviewed and negative.   Physical Exam: VS:  BP 158/58 mmHg  Pulse 69  Ht 5\' 10"  (1.778 m)  Wt 187 lb 9.6 oz (85.095 kg)  BMI 26.92 kg/m2  SpO2 98% .  BMI Body mass index is 26.92 kg/(m^2).  Wt Readings from Last 3 Encounters:  12/18/14 187 lb 9.6 oz (85.095 kg)  11/27/14 190 lb 9.6 oz (86.456 kg)  10/28/14 187 lb 4.8 oz (84.959 kg)   BP is 130/70 by me.  General: Pleasant. Well developed, well nourished and in no acute distress.  HEENT: Normal. Neck: Supple, no JVD, carotid bruits, or masses noted.  Cardiac: Irregular rhythm. Rate seems slow to me on exam. No edema today.  Respiratory:  Lungs are clear to auscultation bilaterally with normal work of breathing.  GI: Soft and nontender.  MS: No deformity or atrophy. Gait and ROM intact. Skin: Warm and dry. Color is normal.  Neuro:  Strength and sensation are intact and no gross focal deficits noted.  Psych: Alert, appropriate and with normal affect.   LABORATORY DATA:  EKG:  EKG is ordered today. This demonstrates sinus brady. Rate is 51. Has PVCs noted.   Lab Results  Component Value Date   WBC 6.9 10/29/2014   HGB 13.9 10/29/2014   HCT 41.0 10/29/2014   PLT 219 10/29/2014   GLUCOSE 91 10/29/2014   CHOL 189 04/11/2013   TRIG 59 04/11/2013   HDL 77 04/11/2013   LDLCALC 100* 04/11/2013   ALT 25 10/29/2014   AST 26 10/29/2014   NA 144 10/29/2014   K 4.1 10/29/2014   CL 107 10/29/2014   CREATININE 1.40* 10/29/2014   BUN 29* 10/29/2014   CO2 24 10/29/2014   TSH 1.218 03/02/2014   INR 1.22 10/29/2014    BNP (last 3 results) No results for input(s): BNP in the last 8760 hours.  ProBNP (last 3 results) No results for input(s): PROBNP in the last 8760 hours.   Other Studies Reviewed Today:  Echo Study Conclusions from 2014  - Left ventricle: The cavity size was at the upper limits of normal. Wall  thickness was normal. Systolic function was normal. The estimated ejection fraction was in the range of 55% to 60%. Wall motion was normal; there were no regional wall motion abnormalities. Left ventricular diastolic function parameters were normal. - Left atrium: The atrium was mildly dilated. - Atrial septum: No defect or patent foramen ovale was identified.   Assessment/Plan: 1. Swelling - seems resolved - needs to use support stockings and restrict salt. Goes down overnight. I would not add additional medicine at this time. Will check baseline labs. Refrain from using NSAID.   2. Palpitations - has been on lower dose of Toprol in the past due to bradycardia - needs his Holter - will place today.   3. CAD - chronic chest pain - seems to be at his baseline. Stable Myoview from earlier this year noted.   4. Atrial flutter - in sinus today - on Eliquis.   Current medicines are reviewed with the patient today.  The patient does not have concerns regarding medicines other than what has been noted above.  The following changes have been made:  See above.  Labs/ tests ordered today include:    Orders Placed This Encounter  Procedures  . Basic metabolic panel  . CBC  . Holter monitor - 24 hour  . EKG 12-Lead     Disposition:   FU with Dr. Debara Pickett as planned unless studies abnormal.    Patient is agreeable to this plan and will call if any problems develop in the interim.   Signed: Burtis Junes, RN, ANP-C 12/18/2014 10:55 AM  [facility] [address] [place], [place]  [address] Phone: [phone] Fax: [phone]

## 2014-12-21 ENCOUNTER — Other Ambulatory Visit: Payer: Self-pay | Admitting: Nurse Practitioner

## 2014-12-21 ENCOUNTER — Ambulatory Visit (INDEPENDENT_AMBULATORY_CARE_PROVIDER_SITE_OTHER): Payer: Medicare HMO

## 2014-12-21 DIAGNOSIS — I483 Typical atrial flutter: Secondary | ICD-10-CM

## 2014-12-21 DIAGNOSIS — I493 Ventricular premature depolarization: Secondary | ICD-10-CM

## 2014-12-21 DIAGNOSIS — R42 Dizziness and giddiness: Secondary | ICD-10-CM | POA: Diagnosis not present

## 2014-12-21 DIAGNOSIS — R002 Palpitations: Secondary | ICD-10-CM | POA: Diagnosis not present

## 2014-12-28 ENCOUNTER — Other Ambulatory Visit: Payer: Self-pay | Admitting: *Deleted

## 2014-12-28 DIAGNOSIS — R001 Bradycardia, unspecified: Secondary | ICD-10-CM

## 2014-12-28 DIAGNOSIS — I498 Other specified cardiac arrhythmias: Secondary | ICD-10-CM

## 2014-12-28 DIAGNOSIS — I471 Supraventricular tachycardia: Secondary | ICD-10-CM

## 2014-12-28 DIAGNOSIS — I480 Paroxysmal atrial fibrillation: Secondary | ICD-10-CM

## 2014-12-28 DIAGNOSIS — I499 Cardiac arrhythmia, unspecified: Secondary | ICD-10-CM

## 2014-12-30 ENCOUNTER — Encounter: Payer: Self-pay | Admitting: Cardiology

## 2014-12-30 ENCOUNTER — Ambulatory Visit (INDEPENDENT_AMBULATORY_CARE_PROVIDER_SITE_OTHER): Payer: Medicare HMO | Admitting: Cardiology

## 2014-12-30 VITALS — BP 126/70 | HR 62 | Ht 70.0 in | Wt 191.8 lb

## 2014-12-30 DIAGNOSIS — I493 Ventricular premature depolarization: Secondary | ICD-10-CM | POA: Diagnosis not present

## 2014-12-30 MED ORDER — AMIODARONE HCL 200 MG PO TABS: 200.0000 mg | ORAL_TABLET | Freq: Every day | ORAL | Status: DC

## 2014-12-30 NOTE — Patient Instructions (Addendum)
Medication Instructions:  Your physician has recommended you make the following change in your medication:  1) START Amiodarone.  Take 200 mg twice a day for 7 days, then   Take 200 mg daily  Labwork: None ordered  Testing/Procedures: Your physician has requested that you have an echocardiogram. Echocardiography is a painless test that uses sound waves to create images of your heart. It provides your doctor with information about the size and shape of your heart and how well your heart's chambers and valves are working. This procedure takes approximately one hour. There are no restrictions for this procedure.  Follow-Up: Your physician wants you to follow-up in: 6 months with Dr. Curt Bears. You will receive a reminder letter in the mail two months in advance. If you don't receive a letter, please call our office to schedule the follow-up appointment.   Any Other Special Instructions Will Be Listed Below (If Applicable).  If you need a refill on your cardiac medications before your next appointment, please call your pharmacy.  Thank you for choosing [facility]!!   Trinidad Curet, RN [phone]

## 2014-12-30 NOTE — Progress Notes (Signed)
Electrophysiology Office Note   Date:  12/31/2014   ID:  Carl Harper, DOB Apr 29, 1934, MRN [id number]  PCP:  Carl Inches, Harper  Cardiologist:  Carl Harper Primary Electrophysiologist:  Carl Tieu Meredith Leeds, Harper    Chief Complaint  Patient presents with  . Atrial Flutter  . PVC"s  . PAD     History of Present Illness: Carl Harper is a 80 y.o. male who presents today for electrophysiology evaluation.   He has a history of peripheral arterial disease. He underwent rotational atherectomy by Dr. Gwenlyn Harper in 2013 with stenting of the calcified right common iliac stenosis. He has mild nonobstructive coronary disease by cath in 2001 and a negative Myoview in 2011. He also has obstructive sleep apnea on CPAP has had reflux symptoms and atypical chest pain from time to time. His echocardiogram does show mild concentric LVH and borderline aortic root dilatation with a possible small ascending aortic aneurysm which Carl Harper need to be followed. He has had his Toprol was cut down due to bradycardia.   Carl Harper was seen by Dr. Percival Harper back in August of 2015 for shortness of breath and palpitations as well as exertional dyspnea. He was to wear a monitor however that was never performed as no monitors were available. He did undergo an exercise tolerance test for which he exercised for 6 minutes and 7 metabolic equivalents. There was no evidence of ischemia. He did have PVCs and a fairly flat blood pressure response to exercise. There was marked dyspnea with exertion.   He has been seen several times this fall,was last seen just 2 weeks ago - was having chest pain and short of breath. Noted to be in atrial flutter at prior visit but had converted on last EKG. He is on Eliquis. Concern for recent TIA. Noted to have had stress testing (ordered by Primary Care) at [facility]. This turned out ok.     Today he feels well but has had palpitations as well as chest pain and in the past. He has had a  normal stress test in March. He is on liquids for atrial fibrillation and flutter and is in sinus rhythm in clinic today.   Today, he denies symptoms of  claudication, dizziness, presyncope, syncope, bleeding, or neurologic sequela. The patient is tolerating medications without difficulties.    Past Medical History  Diagnosis Date  . Hypertension   . PAD (peripheral artery disease) ([place])   . Sleep apnea     dx'd; wore mask; lost weight; turned in machine"  . Dysrhythmia     "irregular"  . Daily headache   . Benign neoplasm of colon 10/24/2006  . Diverticulosis 09/03.2008  . Internal hemorrhoids without mention of complication 53/61/4431  . Restless leg syndrome   . BPH (benign prostatic hyperplasia)   . Memory loss    Past Surgical History  Procedure Laterality Date  . Iliac artery stent Right 11/07/2011    Diamondback orbital rotational atherectomy, PTA & stenting of calcified R CIA (Carl Harper)  . Shoulder arthroscopy w/ rotator cuff repair Left 2010  . Appendectomy  ~ 1954  . Cataract extraction w/ intraocular lens  implant, bilateral  2011-2012  . Cardiac catheterization  03/30/1999    non-critical CAD  . Sleep study  01/09/2011    AHI during total sleep 14.4/hr and during REM 19.4/hr  . Transthoracic echocardiogram  09/2012    EF 55-60%, LA mildly dilated  . Nm myocar perf wall motion  09/2012  lexiscan myoview - low risk with fixed inferior defect with underlying bowel attenuation suggestive of artifact  . Lower extremity angiogram N/A 11/07/2011    Procedure: LOWER EXTREMITY ANGIOGRAM;  Surgeon: Carl Harper;  Location: [facility] C[facility] LAB;  Service: Cardiovascular;  Laterality: N/A;  . Atherectomy Right 11/07/2011    Procedure: ATHERECTOMY;  Surgeon: Carl Harper;  Location: [facility] CATH LAB;  Service: Cardiovascular;  Laterality: Right;  . Percutaneous stent intervention  11/07/2011    Procedure: PERCUTANEOUS STENT INTERVENTION;  Surgeon: Carl Harper;   Location: [facility] CATH LAB;  Service: Cardiovascular;;     Current Outpatient Prescriptions  Medication Sig Dispense Refill  . apixaban (ELIQUIS) 5 MG TABS tablet Take 1 tablet (5 mg total) by mouth 2 (two) times daily. 60 tablet 6  . aspirin 81 MG tablet Take 81 mg by mouth daily.    Marland Kitchen EVENING PRIMROSE OIL PO Take 1,300 mg by mouth daily.    Marland Kitchen losartan (COZAAR) 50 MG tablet Take 1 tablet (50 mg total) by mouth daily. 90 tablet 2  . metoprolol succinate (TOPROL-XL) 25 MG 24 hr tablet Take 1 tablet (25 mg total) by mouth daily. 30 tablet 6  . oxybutynin (DITROPAN XL) 15 MG 24 hr tablet Take 1 tablet by mouth daily.    . pantoprazole (PROTONIX) 40 MG tablet TAKE ONE TABLET   BY MOUTH   DAILY 90 tablet 0  . pramipexole (MIRAPEX) 0.75 MG tablet Take 1 tablet by mouth daily.    . RESTASIS 0.05 % ophthalmic emulsion Apply 1 drop to Carl 2 (two) times daily.    . Tamsulosin HCl (FLOMAX) 0.4 MG CAPS Take 0.4 mg by mouth daily after supper.    . zolpidem (AMBIEN CR) 6.25 MG CR tablet Take 12.5 mg by mouth at bedtime as needed for sleep.     Marland Kitchen amiodarone (PACERONE) 200 MG tablet Take 1 tablet (200 mg total) by mouth daily. 90 tablet 2   No current facility-administered medications for this visit.    Allergies:   Bee venom   Social History:  The patient  reports that he quit smoking about 8 years ago. His smoking use included Cigarettes. He has a 55 pack-year smoking history. He has never used smokeless tobacco. He reports that he drinks about 8.4 oz of alcohol per week. He reports that he does not use illicit drugs.   Family History:  The patient's family history includes Cancer in his maternal grandfather; Cancer (age of onset: 49) in his brother; Heart Problems (age of onset: 15) in his father; Heart failure in his brother.    ROS:  Please see the history of present illness.   Otherwise, review of systems is positive for chest pain, leg swelling, orthopnea, palpitations.   All other systems are  reviewed and negative.    PHYSICAL EXAM: VS:  BP 126/70 mmHg  Pulse 62  Ht 5\' 10"  (1.778 m)  Wt 191 lb 12.8 oz (87 kg)  BMI 27.52 kg/m2 , BMI Body mass index is 27.52 kg/(m^2). GEN: Well nourished, well developed, in no acute distress HEENT: normal Neck: no JVD, carotid bruits, or masses Cardiac: RRR; no murmurs, rubs, or gallops,no edema  Respiratory:  clear to auscultation bilaterally, normal work of breathing GI: soft, nontender, nondistended, + BS MS: no deformity or atrophy Skin: warm and dry Neuro:  Strength and sensation are intact Psych: euthymic mood, full affect  EKG:  EKG is ordered today. The ekg ordered today shows  sinus rhythm, rate 62, PVCs, LAFB  Recent Labs: 03/02/2014: Magnesium 2.1; TSH 1.218 10/29/2014: ALT 25 12/18/2014: BUN 24; Creat 1.25*; Hemoglobin 13.2; Platelets 240; Potassium 4.5; Sodium 140    Lipid Panel     Component Value Date/Time   CHOL 189 04/11/2013 1058   TRIG 59 04/11/2013 1058   HDL 77 04/11/2013 1058   LDLCALC 100* 04/11/2013 1058     Wt Readings from Last 3 Encounters:  12/30/14 191 lb 12.8 oz (87 kg)  12/18/14 187 lb 9.6 oz (85.095 kg)  11/27/14 190 lb 9.6 oz (86.456 kg)      Other studies Reviewed: Additional studies/ records that were reviewed today include: holter  Review of the above records today demonstrates:  PAF, SVT, short runs of VT and ventricular bigeminy. 2.2 second pause, lowest HR of 35.  32% PVCs  3/16 MPI:  No perfusion defects to suggest ischemia. EF 71%.   ASSESSMENT AND PLAN:  1.  Atrial fib/flutter:  The vent monitor showed significant amounts of atrial fibrillation and atrial flutter. The patient is currently on Eliquis and had a CHADS2VASc score of at least 5 indicating that he does need to continue his anticoagulation. For his atrial fibrillation treatment we Isidra Mings start him on amiodarone 200 mg twice a day for a week followed by 200 mg daily. Should he have further atrial fibrillation or flutter in  the future he may require ablation.  2. PVCs:  On his 24 hour monitor he had 32% PVCs. I am unsure if he is symptomatic from these at this time. He does endorse chest pain but he had a normal perfusion scan March. Due to his high volume of PVCs I feel like a repeat echo should be done to assess his ventricular function to see if he has any PVC-induced cardiomyopathy. He is starting amiodarone for his atrial fibrillation and flutter which should also help to suppress the PVCs. Should his EF be decreased we Zaylie Gisler discuss with him the possibility of an ablation in the future.   Current medicines are reviewed at length with the patient today.   The patient does not have concerns regarding his medicines.  The following changes were made today:  Amiodarone 200 mg per day  Labs/ tests ordered today include:  Orders Placed This Encounter  Procedures  . EKG 12-Lead  . ECHOCARDIOGRAM COMPLETE     Disposition:   FU with Roslyn Else 6 months  Signed, Alyssamarie Mounsey Meredith Leeds, Harper  12/31/2014 3:30 PM     [place] [address] [place] [address] [phone] (office) [phone] (fax)

## 2014-12-31 NOTE — Addendum Note (Signed)
Addended by: Therisa Doyne on: 12/31/2014 04:36 PM   Modules accepted: Orders

## 2015-01-04 ENCOUNTER — Ambulatory Visit (HOSPITAL_COMMUNITY): Payer: Medicare HMO | Attending: Cardiovascular Disease

## 2015-01-04 ENCOUNTER — Other Ambulatory Visit: Payer: Self-pay

## 2015-01-04 DIAGNOSIS — Z87891 Personal history of nicotine dependence: Secondary | ICD-10-CM | POA: Insufficient documentation

## 2015-01-04 DIAGNOSIS — I10 Essential (primary) hypertension: Secondary | ICD-10-CM | POA: Insufficient documentation

## 2015-01-04 DIAGNOSIS — I493 Ventricular premature depolarization: Secondary | ICD-10-CM

## 2015-01-04 DIAGNOSIS — Z8249 Family history of ischemic heart disease and other diseases of the circulatory system: Secondary | ICD-10-CM | POA: Insufficient documentation

## 2015-01-04 DIAGNOSIS — G4733 Obstructive sleep apnea (adult) (pediatric): Secondary | ICD-10-CM | POA: Diagnosis not present

## 2015-01-04 DIAGNOSIS — I739 Peripheral vascular disease, unspecified: Secondary | ICD-10-CM | POA: Insufficient documentation

## 2015-01-04 DIAGNOSIS — I517 Cardiomegaly: Secondary | ICD-10-CM | POA: Diagnosis not present

## 2015-01-19 ENCOUNTER — Telehealth: Payer: Self-pay | Admitting: Internal Medicine

## 2015-01-19 ENCOUNTER — Other Ambulatory Visit: Payer: Self-pay

## 2015-01-19 ENCOUNTER — Other Ambulatory Visit: Payer: Self-pay | Admitting: Internal Medicine

## 2015-01-19 DIAGNOSIS — S0990XA Unspecified injury of head, initial encounter: Secondary | ICD-10-CM

## 2015-01-19 NOTE — Telephone Encounter (Signed)
Returned call to patient.Dr.Hilty advised to schedule plain head ct without contrast. Imaging unable to do head ct until 01/22/15.Spoke to [place] at [facility] radiology she advised will need a precert # before she can schedule 01/20/15.Carl Harper called she obtained precert.# [id number].  Head CT without contrast scheduled Wed 01/20/15 at 1:00 pm.Advised to arrive at 12:45 pm at [place] [facility] entrance of [place].Advised clear liquids 4 hours prior to head ct.

## 2015-01-19 NOTE — Telephone Encounter (Signed)
Returned call to patient.He stated while taking a shower yesterday shower head fell and hit him on top of head.Stated did not break skin no bleeding,head slightly sore today,he does have a headache.Message sent to Dr.Hilty for advice.

## 2015-01-19 NOTE — Telephone Encounter (Signed)
Pt called in stating that he hit his head yesterday and he was informed that if this occurred while he was on Eliquis , to call the office. Please f/u with pt  Thanks

## 2015-01-19 NOTE — Telephone Encounter (Signed)
Ok to order a plain head CT (outpatient - [facility]) - indication, head trauma on anticoagulation. Would advise having it done asap.  Dr. Lemmie Evens

## 2015-01-20 ENCOUNTER — Ambulatory Visit (HOSPITAL_COMMUNITY)
Admission: RE | Admit: 2015-01-20 | Discharge: 2015-01-20 | Disposition: A | Discharge status: Home / Self Care | Payer: Medicare HMO | Source: Ambulatory Visit | Attending: Internal Medicine | Admitting: Internal Medicine

## 2015-01-20 DIAGNOSIS — R51 Headache: Secondary | ICD-10-CM | POA: Insufficient documentation

## 2015-01-20 DIAGNOSIS — X58XXXA Exposure to other specified factors, initial encounter: Secondary | ICD-10-CM | POA: Diagnosis not present

## 2015-01-20 DIAGNOSIS — S0990XA Unspecified injury of head, initial encounter: Secondary | ICD-10-CM | POA: Insufficient documentation

## 2015-01-22 ENCOUNTER — Other Ambulatory Visit: Payer: Medicare HMO

## 2015-02-10 ENCOUNTER — Other Ambulatory Visit: Payer: Self-pay | Admitting: Internal Medicine

## 2015-02-10 NOTE — Telephone Encounter (Signed)
Will Meredith Leeds, MD at 12/30/2014 2:21 PM  losartan (COZAAR) 50 MG tabletTake 1 tablet (50 mg total) by mouth daily Patient Instructions     Medication Instructions:  Your physician has recommended you make the following change in your medication:  1) START Amiodarone. Take 200 mg twice a day for 7 days, then  Take 200 mg daily

## 2015-03-08 ENCOUNTER — Other Ambulatory Visit: Payer: Self-pay | Admitting: Internal Medicine

## 2015-03-08 NOTE — Telephone Encounter (Signed)
Rx request sent to pharmacy.  

## 2015-04-27 ENCOUNTER — Other Ambulatory Visit: Payer: Self-pay | Admitting: Internal Medicine

## 2015-05-07 DIAGNOSIS — G252 Other specified forms of tremor: Secondary | ICD-10-CM | POA: Insufficient documentation

## 2015-05-18 ENCOUNTER — Encounter: Payer: Self-pay | Admitting: Physical Therapy

## 2015-05-18 ENCOUNTER — Ambulatory Visit: Payer: Medicare HMO | Attending: Family Medicine | Admitting: Physical Therapy

## 2015-05-18 DIAGNOSIS — R5381 Other malaise: Secondary | ICD-10-CM | POA: Diagnosis present

## 2015-05-18 DIAGNOSIS — R262 Difficulty in walking, not elsewhere classified: Secondary | ICD-10-CM | POA: Diagnosis present

## 2015-05-18 DIAGNOSIS — R2681 Unsteadiness on feet: Secondary | ICD-10-CM | POA: Insufficient documentation

## 2015-05-18 NOTE — Therapy (Signed)
[address] [place], [place], [address] Phone: [phone]   Fax:  [phone]  Physical Therapy Evaluation  Patient Details  Name: Carl Harper MRN: [id number] Date of Birth: 09-23-1934 Referring Provider: Coletta Memos  Encounter Date: 05/18/2015      PT End of Session - 05/18/15 1511    Visit Number 1   Date for PT Re-Evaluation 07/18/15   PT Start Time 1430   PT Stop Time [id number]   PT Time Calculation (min) 44 min   Activity Tolerance Patient tolerated treatment well   Behavior During Therapy [facility] for tasks assessed/performed      Past Medical History  Diagnosis Date  . Hypertension   . PAD (peripheral artery disease) ([place])   . Sleep apnea     dx'd; wore mask; lost weight; turned in machine"  . Dysrhythmia     "irregular"  . Daily headache   . Benign neoplasm of colon 10/24/2006  . Diverticulosis 09/03.2008  . Internal hemorrhoids without mention of complication [id number]  . Restless leg syndrome   . BPH (benign prostatic hyperplasia)   . Memory loss     Past Surgical History  Procedure Laterality Date  . Iliac artery stent Right 11/07/2011    Diamondback orbital rotational atherectomy, PTA & stenting of calcified R CIA (Dr. Adora Fridge)  . Shoulder arthroscopy w/ rotator cuff repair Left 2010  . Appendectomy  ~ 1954  . Cataract extraction w/ intraocular lens  implant, bilateral  2011-2012  . Cardiac catheterization  03/30/1999    non-critical CAD  . Sleep study  01/09/2011    AHI during total sleep 14.4/hr and during REM 19.4/hr  . Transthoracic echocardiogram  09/2012    EF 55-60%, LA mildly dilated  . Nm myocar perf wall motion  09/2012    lexiscan myoview - low risk with fixed inferior defect with underlying bowel attenuation suggestive of artifact  . Lower extremity angiogram N/A 11/07/2011    Procedure: LOWER EXTREMITY ANGIOGRAM;  Surgeon: Lorretta Harp, MD;  Location: [facility] CATH LAB;   Service: Cardiovascular;  Laterality: N/A;  . Atherectomy Right 11/07/2011    Procedure: ATHERECTOMY;  Surgeon: Lorretta Harp, MD;  Location: [facility] CATH LAB;  Service: Cardiovascular;  Laterality: Right;  . Percutaneous stent intervention  11/07/2011    Procedure: PERCUTANEOUS STENT INTERVENTION;  Surgeon: Lorretta Harp, MD;  Location: [facility] CATH LAB;  Service: Cardiovascular;;    There were no vitals filed for this visit.  Visit Diagnosis:  Difficulty in walking, not elsewhere classified - Plan: PT plan of care cert/re-cert  Debility - Plan: PT plan of care cert/re-cert  Gait instability - Plan: PT plan of care cert/re-cert      Subjective Assessment - 05/18/15 1429    Subjective Patient reports that he feels that his legs have become weaker over the past 8 months and he feels unsteady walking at times, but reports no falls.  He reports that he has decreased his walking because he feels weak.   Limitations Walking   Patient Stated Goals feel stronger and more stable when walking   Currently in Pain? No/denies            [facility] PT Assessment - 05/18/15 0001    Assessment   Medical Diagnosis debility, gait instability   Referring Provider [place]   Onset Date/Surgical Date 05/10/15   Prior Therapy no   Precautions   Precautions None   Balance Screen  Has the patient fallen in the past 6 months No   Has the patient had a decrease in activity level because of a fear of falling?  No   Is the patient reluctant to leave their home because of a fear of falling?  No   Home Environment   Additional Comments does yard and housework   Prior Function   Level of Independence Independent   Leisure He was walking 3 miles a day prior to 6 months ago, now reports doing less than a mile due to weakness   Strength   Overall Strength Comments Hip strength 4-/5 , knees 4-/5, ankles 4+/5    Standardized Balance Assessment   Standardized Balance Assessment Timed Up and Go Test;Berg Balance Test    Berg Balance Test   Sit to Stand Able to stand without using hands and stabilize independently   Standing Unsupported Able to stand safely 2 minutes   Sitting with Back Unsupported but Feet Supported on Floor or Stool Able to sit safely and securely 2 minutes   Stand to Sit Sits safely with minimal use of hands   Transfers Able to transfer safely, minor use of hands   Standing Unsupported with Eyes Closed Able to stand 10 seconds safely   Standing Ubsupported with Feet Together Able to place feet together independently and stand 1 minute safely   From Standing, Reach Forward with Outstretched Arm Can reach forward >12 cm safely (5")   From Standing Position, Pick up Object from Floor Able to pick up shoe, needs supervision   From Standing Position, Turn to Look Behind Over each Shoulder Looks behind one side only/other side shows less weight shift   Turn 360 Degrees Able to turn 360 degrees safely one side only in 4 seconds or less   Standing Unsupported, Alternately Place Feet on Step/Stool Able to stand independently and complete 8 steps >20 seconds   Standing Unsupported, One Foot in Front Able to plae foot ahead of the other independently and hold 30 seconds   Standing on One Leg Able to lift leg independently and hold equal to or more than 3 seconds   Total Score 48   Timed Up and Go Test   Normal TUG (seconds) 16  lost balance on his turn but was able to catch himself                   [facility] Adult PT Treatment/Exercise - 05/18/15 0001    High Level Balance   High Level Balance Activities Side stepping;Tandem walking;Backward walking   Exercises   Exercises Knee/Hip   Knee/Hip Exercises: Aerobic   Nustep Level 5 x 5 minutes                PT Education - 05/18/15 1510    Education provided Yes   Education Details HEP for balance   Person(s) Educated Patient   Methods Explanation;Demonstration;Handout   Comprehension Verbalized understanding           PT Short Term Goals - 05/18/15 1513    PT SHORT TERM GOAL #1   Title independent with initial HEP   Time 2   Period Weeks   Status New           PT Long Term Goals - 05/18/15 1514    PT LONG TERM GOAL #1   Title decrease timed up and go test to 13 seconds   Time 8   Period Weeks   Status New  PT LONG TERM GOAL #2   Title increase berg balance test score to 50/56   Time 8   Period Weeks   Status New   PT LONG TERM GOAL #3   Title return to walking 2 miles for exercise   Time 8   Period Weeks   Status New   PT LONG TERM GOAL #4   Title walk on uneven surfaces without loss of balance or without reports of feeling fearful   Time 8   Period Weeks   Status New               Plan - 06-15-15 1511    Clinical Impression Statement Patient reports that over the past 8 months he has had increased difficulty walking due to feeling weak, he also reports being unsteady and unsure of his balance, he denies any falls.  His hips were weak, he lost his balance when trurning during the TUG test, his Berg balance test score was 46/56 putting him at a higher risk for falls.   Pt will benefit from skilled therapeutic intervention in order to improve on the following deficits Abnormal gait;Decreased activity tolerance;Decreased mobility;Decreased range of motion;Decreased strength;Difficulty walking;Impaired flexibility;Pain   Rehab Potential Good   PT Frequency 2x / week   PT Duration 8 weeks   PT Treatment/Interventions ADLs/Self Care Home Management;Functional mobility training;Gait training;Therapeutic activities;Therapeutic exercise;Manual techniques;Patient/family education;Balance training;Neuromuscular re-education;Passive range of motion   PT Next Visit Plan assure HEP, progress with LE strength and balance   Consulted and Agree with Plan of Care Patient          G-Codes - 06-15-15 1515    Functional Assessment Tool Used Foto 56% limitation   Functional Limitation  Mobility: Walking and moving around   Mobility: Walking and Moving Around Current Status [phone]) At least 40 percent but less than 60 percent impaired, limited or restricted   Mobility: Walking and Moving Around Goal Status [phone]) At least 20 percent but less than 40 percent impaired, limited or restricted       Problem List Patient Active Problem List   Diagnosis Date Noted  . Atrial flutter ([place]) 10/28/2014  . DOE (dyspnea on exertion) 10/10/2013  . PVC's (premature ventricular contractions) 09/26/2013  . OSA on CPAP 03/31/2013  . Hx of adenomatous colonic polyps 02/28/2012  . PAD (peripheral artery disease) ([place]) 11/08/2011  . HTN (hypertension) 11/08/2011  . Claudication [facility]) 11/08/2011    Sumner Boast., PT 15-Jun-2015, 3:17 PM  [address] [place], [place], [address] Phone: [phone]   Fax:  [phone]  Name: ABRAHAN PAGETT MRN: [id number] Date of Birth: 20-Jan-1935

## 2015-05-21 ENCOUNTER — Encounter: Payer: Self-pay | Admitting: Internal Medicine

## 2015-05-21 ENCOUNTER — Telehealth: Payer: Self-pay | Admitting: Internal Medicine

## 2015-05-21 ENCOUNTER — Ambulatory Visit (INDEPENDENT_AMBULATORY_CARE_PROVIDER_SITE_OTHER): Payer: Medicare HMO | Admitting: Internal Medicine

## 2015-05-21 VITALS — BP 126/62 | HR 52 | Ht 70.0 in | Wt 188.0 lb

## 2015-05-21 DIAGNOSIS — I4892 Unspecified atrial flutter: Secondary | ICD-10-CM

## 2015-05-21 DIAGNOSIS — Z9989 Dependence on other enabling machines and devices: Secondary | ICD-10-CM

## 2015-05-21 DIAGNOSIS — Z79899 Other long term (current) drug therapy: Secondary | ICD-10-CM | POA: Diagnosis not present

## 2015-05-21 DIAGNOSIS — I493 Ventricular premature depolarization: Secondary | ICD-10-CM | POA: Diagnosis not present

## 2015-05-21 DIAGNOSIS — I481 Persistent atrial fibrillation: Secondary | ICD-10-CM | POA: Diagnosis not present

## 2015-05-21 DIAGNOSIS — I739 Peripheral vascular disease, unspecified: Secondary | ICD-10-CM

## 2015-05-21 DIAGNOSIS — I10 Essential (primary) hypertension: Secondary | ICD-10-CM

## 2015-05-21 DIAGNOSIS — I4819 Other persistent atrial fibrillation: Secondary | ICD-10-CM

## 2015-05-21 DIAGNOSIS — G4733 Obstructive sleep apnea (adult) (pediatric): Secondary | ICD-10-CM

## 2015-05-21 LAB — COMPREHENSIVE METABOLIC PANEL
ALBUMIN: 4.3 g/dL (ref 3.6–5.1)
ALT: 14 U/L (ref 9–46)
AST: 17 U/L (ref 10–35)
Alkaline Phosphatase: 54 U/L (ref 40–115)
BILIRUBIN TOTAL: 0.4 mg/dL (ref 0.2–1.2)
BUN: 25 mg/dL (ref 7–25)
CHLORIDE: 106 mmol/L (ref 98–110)
CO2: 26 mmol/L (ref 20–31)
CREATININE: 1.38 mg/dL — AB (ref 0.70–1.18)
Calcium: 8.6 mg/dL (ref 8.6–10.3)
GLUCOSE: 108 mg/dL — AB (ref 65–99)
Potassium: 4.8 mmol/L (ref 3.5–5.3)
SODIUM: 140 mmol/L (ref 135–146)
Total Protein: 6.5 g/dL (ref 6.1–8.1)

## 2015-05-21 LAB — TSH: TSH: 2.7 mIU/L (ref 0.40–4.50)

## 2015-05-21 NOTE — Patient Instructions (Signed)
Your physician recommends that you return for lab work TODAY - first floor, [id number]  Your physician has recommended that you have a pulmonary function test. Pulmonary Function Tests are a group of tests that measure how well air moves in and out of your lungs. This test is done at [facility] wants you to follow-up in: 6 months with Dr. Debara Pickett. You will receive a reminder letter in the mail two months in advance. If you don't receive a letter, please call our office to schedule the follow-up appointment.

## 2015-05-21 NOTE — Telephone Encounter (Signed)
I called the patients cell phone and left a voicemail for his pulmonary function test, which is scheduled for 05-28-15 at 10 a.m., at [facility].

## 2015-05-23 NOTE — Progress Notes (Signed)
OFFICE NOTE  Chief Complaint:  Short acting chest discomfort  Primary Care Physician: Phineas Inches, MD  HPI:  Carl Harper is a pleasant 81 year old male previously followed Dr. Rollene Fare with a history of peripheral arterial disease. He underwent diamondback orbital rotational atherectomy by Dr. Gwenlyn Found in 2013 with stenting of the calcified right common iliac stenosis. He has mild nonobstructive coronary disease by cath in 2001 and a negative Myoview in 2011. He also has obstructive sleep apnea on CPAP has had reflux symptoms and atypical chest pain from time to time.  His echocardiogram does show mild concentric LVH and borderline aortic root dilatation with a possible small ascending aortic aneurysm which will need to be followed. At his last visit with Dr. Rollene Fare his Toprol was cut down to 25 mg alternating with 12.5 mg every 2 some bradycardia.   Carl Harper was recently seen by Dr. Percival Spanish in the office for shortness of breath and palpitations as well as exertional dyspnea. He wore the monitor however that was never performed as no monitors were available. He did undergo an exercise tolerance test for which he exercised for 6 minutes and 7 metabolic equivalents. There was no evidence of ischemia. He did have PVCs and a fairly flat blood pressure response to exercise. There was marked dyspnea with exertion. He also is reported recent tremors, some memory loss and instability with his gait.  Carl Harper returns today and is complaining of a sinking spell his chest. When pressed further he felt like he was possibly presyncopal occasionally has trouble getting his breath. He has some occasional dizziness. He was evaluated for monitor however none was placed because there was not a monitor available. He did have his metoprolol increased to 12.5/25 mg every other day. He seems to think that this is helped his symptoms and feels better than he had when he saw Dr. Percival Spanish.  I saw Carl Harper  back in the office today. He says that he call and make an appointment about a month ago because his been feeling some episodes of chest discomfort in the morning. He's also felt somewhat short of breath. An EKG in the office today demonstrates new onset atrial flutter with ventricular bigeminy at a rate of 77. This is a new diagnosis for him. I spent greater than 10 minutes discussing atrial fibrillation and anticoagulation options with him. He understands his options and the need for trying to establish a sinus rhythm.  Carl Harper returns today the office for follow-up. His EKG today fortunately shows sinus rhythm with PVCs. This is good news as we will not need to schedule him for a cardioversion. He has been taking Eliquis and feels that this is tolerable. He denies any bleeding problems. Here she does feel improvement in energy and I suspect this is from converting back to sinus rhythm at some point. Based on his TIA event, I suspect it may been related to paroxysmal atrial flutter. He did have carotid Dopplers which show very mild bilateral carotid disease and are not likely the source of his TIA event.  I saw Carl Harper back today in the office. He is maintaining sinus bradycardia with first degree AV block at a rate of 52. He denies any bleeding problems on Eliquis. He is on low-dose amiodarone. This was started by Dr. Curt Bears on 12/30/14, after monitor was placed which showed a very high burden of PVCs greater than 30% as well as A. fib. A repeat echo was performed which showed  preserved LV systolic function. Carl Harper was symptomatic with his PVCs. He reports a marked reduction in his symptoms after starting the amiodarone. I do not see these had baseline pulmonary function testing, thyroid or liver function tests.  PMHx:  Past Medical History  Diagnosis Date  . Hypertension   . PAD (peripheral artery disease) ([place])   . Sleep apnea     dx'd; wore mask; lost weight; turned in machine"  .  Dysrhythmia     "irregular"  . Daily headache   . Benign neoplasm of colon 10/24/2006  . Diverticulosis 09/03.2008  . Internal hemorrhoids without mention of complication [id number]  . Restless leg syndrome   . BPH (benign prostatic hyperplasia)   . Memory loss     Past Surgical History  Procedure Laterality Date  . Iliac artery stent Right 11/07/2011    Diamondback orbital rotational atherectomy, PTA & stenting of calcified R CIA (Dr. Adora Fridge)  . Shoulder arthroscopy w/ rotator cuff repair Left 2010  . Appendectomy  ~ 1954  . Cataract extraction w/ intraocular lens  implant, bilateral  2011-2012  . Cardiac catheterization  03/30/1999    non-critical CAD  . Sleep study  01/09/2011    AHI during total sleep 14.4/hr and during REM 19.4/hr  . Transthoracic echocardiogram  09/2012    EF 55-60%, LA mildly dilated  . Nm myocar perf wall motion  09/2012    lexiscan myoview - low risk with fixed inferior defect with underlying bowel attenuation suggestive of artifact  . Lower extremity angiogram N/A 11/07/2011    Procedure: LOWER EXTREMITY ANGIOGRAM;  Surgeon: Lorretta Harp, MD;  Location: [facility] CATH LAB;  Service: Cardiovascular;  Laterality: N/A;  . Atherectomy Right 11/07/2011    Procedure: ATHERECTOMY;  Surgeon: Lorretta Harp, MD;  Location: [facility] CATH LAB;  Service: Cardiovascular;  Laterality: Right;  . Percutaneous stent intervention  11/07/2011    Procedure: PERCUTANEOUS STENT INTERVENTION;  Surgeon: Lorretta Harp, MD;  Location: [facility] CATH LAB;  Service: Cardiovascular;;    FAMHx:  Family History  Problem Relation Age of Onset  . Heart Problems Father 53  . Cancer Brother 70  . Heart failure Brother   . Cancer Maternal Grandfather     prostate    SOCHx:   reports that he quit smoking about 9 years ago. His smoking use included Cigarettes. He has a 55 pack-year smoking history. He has never used smokeless tobacco. He reports that he drinks about 8.4 oz of alcohol per week. He  reports that he does not use illicit drugs.  ALLERGIES:  Allergies  Allergen Reactions  . Bee Venom Other (See Comments)    unknown    ROS: A comprehensive review of systems was negative.  HOME MEDS: Current Outpatient Prescriptions  Medication Sig Dispense Refill  . amiodarone (PACERONE) 200 MG tablet Take 1 tablet (200 mg total) by mouth daily. 90 tablet 2  . apixaban (ELIQUIS) 5 MG TABS tablet Take 1 tablet (5 mg total) by mouth 2 (two) times daily. 60 tablet 6  . aspirin 81 MG tablet Take 81 mg by mouth daily.    Marland Kitchen EVENING PRIMROSE OIL PO Take 1,300 mg by mouth daily.    Marland Kitchen losartan (COZAAR) 50 MG tablet TAKE 1 TABLET (50 MG TOTAL) BY MOUTH DAILY. 90 tablet 1  . metoprolol succinate (TOPROL-XL) 25 MG 24 hr tablet TAKE 1 TABLET BY MOUTH EVERY DAY 90 tablet 0  . oxybutynin (DITROPAN XL) 15 MG 24 hr tablet  Take 1 tablet by mouth daily.    . pantoprazole (PROTONIX) 40 MG tablet TAKE ONE TABLET   BY MOUTH   DAILY 90 tablet 0  . pramipexole (MIRAPEX) 0.75 MG tablet Take 1 tablet by mouth daily.    . RESTASIS 0.05 % ophthalmic emulsion Apply 1 drop to eye 2 (two) times daily.    . rivastigmine (EXELON) 4.6 mg/24hr Place 4.6 mg onto the skin daily.  12  . Tamsulosin HCl (FLOMAX) 0.4 MG CAPS Take 0.4 mg by mouth daily after supper.    . traZODone (DESYREL) 50 MG tablet Take 1.5 tablets by mouth at bedtime.  2  . zolpidem (AMBIEN CR) 6.25 MG CR tablet Take 12.5 mg by mouth at bedtime as needed for sleep.      No current facility-administered medications for this visit.    LABS/IMAGING: No results found for this or any previous visit (from the past 48 hour(s)). No results found.  VITALS: BP 126/62 mmHg  Pulse 52  Ht 5\' 10"  (1.778 m)  Wt 188 lb (85.276 kg)  BMI 26.98 kg/m2  EXAM: General appearance: alert and no distress Neck: no carotid bruit and no JVD Lungs: clear to auscultation bilaterally Heart: regular rate and rhythm, S1, S2 normal, no murmur, click, rub or  gallop Abdomen: soft, non-tender; bowel sounds normal; no masses,  no organomegaly Extremities: extremities normal, atraumatic, no cyanosis or edema Pulses: 2+ and symmetric Skin: Skin color, texture, turgor normal. No rashes or lesions Neurologic: Grossly normal Psych: Pleasant  EKG: Sinus bradycardia with first-degree AV block at 52, left anterior fascicular block, QTC 422 ms  ASSESSMENT: 1. New onset atrial flutter with frequent PVC's - CHADSVASC score of 4 on Eliquis, now on amiodarone in sinus bradycardia 2. PAD status post diamondback were orbital atherectomy and stenting of the right common iliac artery 3. Hypertension 4. Dyslipidemia 5. Mild CAD 6. Obstructive sleep apnea on CPAP 7. DOE - negative treadmill stress test 8. Tremor/memory loss/gait abnormalities - no evidence of Parkinson's by recent neurology consult  PLAN: 1.   Carl Harper reports a marked improvement in his atrial flutter and PVCs with amiodarone. He is now in sinus bradycardia and maintaining that. He has no bleeding problems with Eliquis. Blood pressure is well-controlled. He denies any chest pain. Generally seems to be tolerating his medications well. QTC is stable. I will go ahead and order primary function testing as well as a TSH and liver enzymes for amiodarone therapy. He has follow-up with Dr. Curt Bears in <6 months. I will see him then in 6 months.  Pixie Casino, MD, [facility] Attending Cardiologist [place] 05/23/2015, 5:00 PM

## 2015-05-25 ENCOUNTER — Ambulatory Visit: Payer: Medicare HMO | Attending: Family Medicine | Admitting: Physical Therapy

## 2015-05-25 DIAGNOSIS — R5381 Other malaise: Secondary | ICD-10-CM | POA: Insufficient documentation

## 2015-05-25 DIAGNOSIS — R2681 Unsteadiness on feet: Secondary | ICD-10-CM | POA: Insufficient documentation

## 2015-05-25 DIAGNOSIS — R262 Difficulty in walking, not elsewhere classified: Secondary | ICD-10-CM | POA: Insufficient documentation

## 2015-05-25 DIAGNOSIS — M6281 Muscle weakness (generalized): Secondary | ICD-10-CM | POA: Insufficient documentation

## 2015-05-25 DIAGNOSIS — R2689 Other abnormalities of gait and mobility: Secondary | ICD-10-CM | POA: Insufficient documentation

## 2015-05-27 ENCOUNTER — Ambulatory Visit: Payer: Medicare HMO | Admitting: Physical Therapy

## 2015-05-27 ENCOUNTER — Encounter: Payer: Self-pay | Admitting: Physical Therapy

## 2015-05-27 DIAGNOSIS — M6281 Muscle weakness (generalized): Secondary | ICD-10-CM

## 2015-05-27 DIAGNOSIS — R262 Difficulty in walking, not elsewhere classified: Secondary | ICD-10-CM | POA: Diagnosis not present

## 2015-05-27 DIAGNOSIS — R2681 Unsteadiness on feet: Secondary | ICD-10-CM | POA: Diagnosis present

## 2015-05-27 DIAGNOSIS — R5381 Other malaise: Secondary | ICD-10-CM | POA: Diagnosis not present

## 2015-05-27 DIAGNOSIS — R2689 Other abnormalities of gait and mobility: Secondary | ICD-10-CM | POA: Diagnosis present

## 2015-05-27 NOTE — Therapy (Signed)
[facility] [place], [place], [address] Phone: [phone]   Fax:  [phone]  Physical Therapy Treatment  Patient Details  Name: Carl Harper MRN: [id number] Date of Birth: 05-14-1934 Referring Provider: Coletta Memos  Encounter Date: 05/27/2015      PT End of Session - 05/27/15 1143    Visit Number 2   Date for PT Re-Evaluation 07/18/15   PT Start Time 1101   PT Stop Time 1142   PT Time Calculation (min) 41 min   Activity Tolerance Patient tolerated treatment well   Behavior During Therapy [facility] for tasks assessed/performed      Past Medical History  Diagnosis Date  . Hypertension   . PAD (peripheral artery disease) ([place])   . Sleep apnea     dx'd; wore mask; lost weight; turned in machine"  . Dysrhythmia     "irregular"  . Daily headache   . Benign neoplasm of colon 10/24/2006  . Diverticulosis 09/03.2008  . Internal hemorrhoids without mention of complication [id number]  . Restless leg syndrome   . BPH (benign prostatic hyperplasia)   . Memory loss     Past Surgical History  Procedure Laterality Date  . Iliac artery stent Right 11/07/2011    Diamondback orbital rotational atherectomy, PTA & stenting of calcified R CIA (Dr. Adora Fridge)  . Shoulder arthroscopy w/ rotator cuff repair Left 2010  . Appendectomy  ~ 1954  . Cataract extraction w/ intraocular lens  implant, bilateral  2011-2012  . Cardiac catheterization  03/30/1999    non-critical CAD  . Sleep study  01/09/2011    AHI during total sleep 14.4/hr and during REM 19.4/hr  . Transthoracic echocardiogram  09/2012    EF 55-60%, LA mildly dilated  . Nm myocar perf wall motion  09/2012    lexiscan myoview - low risk with fixed inferior defect with underlying bowel attenuation suggestive of artifact  . Lower extremity angiogram N/A 11/07/2011    Procedure: LOWER EXTREMITY ANGIOGRAM;  Surgeon: Lorretta Harp, MD;  Location: [facility] CATH LAB;   Service: Cardiovascular;  Laterality: N/A;  . Atherectomy Right 11/07/2011    Procedure: ATHERECTOMY;  Surgeon: Lorretta Harp, MD;  Location: [facility] CATH LAB;  Service: Cardiovascular;  Laterality: Right;  . Percutaneous stent intervention  11/07/2011    Procedure: PERCUTANEOUS STENT INTERVENTION;  Surgeon: Lorretta Harp, MD;  Location: [facility] CATH LAB;  Service: Cardiovascular;;    There were no vitals filed for this visit.  Visit Diagnosis:  Difficulty in walking, not elsewhere classified  Debility  Gait instability      Subjective Assessment - 05/27/15 1105    Subjective Pt reports no new issues.   Currently in Pain? Yes   Pain Score 7    Pain Location Back                         [facility] Adult PT Treatment/Exercise - 05/27/15 0001    High Level Balance   High Level Balance Activities Side stepping;Tandem walking;Backward walking   High Level Balance Comments Standing march on airex.   Exercises   Exercises Knee/Hip;Lumbar   Lumbar Exercises: Machines for Strengthening   Cybex Knee Extension #10 2x15    Cybex Knee Flexion #25 2x15    Leg Press #20 2x15    Lumbar Exercises: Seated   Other Seated Lumbar Exercises Rows and Lats #25 2x10   Knee/Hip Exercises: Aerobic  Nustep Level 5 x 6 minutes   Knee/Hip Exercises: Seated   Sit to Sand 2 sets;10 reps                  PT Short Term Goals - 05/18/15 1513    PT SHORT TERM GOAL #1   Title independent with initial HEP   Time 2   Period Weeks   Status New           PT Long Term Goals - 05/18/15 1514    PT LONG TERM GOAL #1   Title decrease timed up and go test to 13 seconds   Time 8   Period Weeks   Status New   PT LONG TERM GOAL #2   Title increase berg balance test score to 50/56   Time 8   Period Weeks   Status New   PT LONG TERM GOAL #3   Title return to walking 2 miles for exercise   Time 8   Period Weeks   Status New   PT LONG TERM GOAL #4   Title walk on uneven surfaces  without loss of balance or without reports of feeling fearful   Time 8   Period Weeks   Status New               Plan - 05/27/15 1143    Clinical Impression Statement Pt tolerated a progression to gym level interventions today without c/o increase pain. Requires cues for pacing with exercises. mild balance issue with standing march on airex pad,  required tactile for positioning on airex pad.   Pt will benefit from skilled therapeutic intervention in order to improve on the following deficits Abnormal gait;Decreased activity tolerance;Decreased mobility;Decreased range of motion;Decreased strength;Difficulty walking;Impaired flexibility;Pain   Rehab Potential Good   PT Frequency 2x / week   PT Duration 8 weeks   PT Treatment/Interventions ADLs/Self Care Home Management;Functional mobility training;Gait training;Therapeutic activities;Therapeutic exercise;Manual techniques;Patient/family education;Balance training;Neuromuscular re-education;Passive range of motion   PT Next Visit Plan progress with LE strength and balance        Problem List Patient Active Problem List   Diagnosis Date Noted  . Atrial flutter ([place]) 10/28/2014  . DOE (dyspnea on exertion) 10/10/2013  . PVC's (premature ventricular contractions) 09/26/2013  . OSA on CPAP 03/31/2013  . Hx of adenomatous colonic polyps 02/28/2012  . PAD (peripheral artery disease) ([place]) 11/08/2011  . HTN (hypertension) 11/08/2011  . Claudication [facility]) 11/08/2011    Scot Jun, PTA  05/27/2015, 11:46 AM  [facility] [place], [place], [address] Phone: [phone]   Fax:  [phone]  Name: Carl Harper MRN: [id number] Date of Birth: [birth year]

## 2015-05-28 ENCOUNTER — Ambulatory Visit (HOSPITAL_COMMUNITY)
Admission: RE | Admit: 2015-05-28 | Discharge: 2015-05-28 | Disposition: A | Discharge status: Home / Self Care | Payer: Medicare HMO | Source: Ambulatory Visit | Attending: Internal Medicine | Admitting: Internal Medicine

## 2015-05-28 DIAGNOSIS — Z5181 Encounter for therapeutic drug level monitoring: Secondary | ICD-10-CM | POA: Diagnosis not present

## 2015-05-28 DIAGNOSIS — Z79899 Other long term (current) drug therapy: Secondary | ICD-10-CM | POA: Diagnosis not present

## 2015-05-28 LAB — PULMONARY FUNCTION TEST
DL/VA % PRED: 85 %
DL/VA: 3.79 ml/min/mmHg/L
DLCO UNC % PRED: 65 %
DLCO UNC: 19.38 ml/min/mmHg
FEF 25-75 POST: 2 L/s
FEF 25-75 PRE: 1.93 L/s
FEF2575-%Change-Post: 3 %
FEF2575-%PRED-POST: 109 %
FEF2575-%Pred-Pre: 105 %
FEV1-%Change-Post: -1 %
FEV1-%Pred-Post: 96 %
FEV1-%Pred-Pre: 98 %
FEV1-POST: 2.57 L
FEV1-Pre: 2.6 L
FEV1FVC-%Change-Post: 3 %
FEV1FVC-%PRED-PRE: 104 %
FEV6-%CHANGE-POST: -3 %
FEV6-%PRED-POST: 95 %
FEV6-%Pred-Pre: 99 %
FEV6-Post: 3.31 L
FEV6-Pre: 3.44 L
FEV6FVC-%CHANGE-POST: 0 %
FEV6FVC-%PRED-POST: 107 %
FEV6FVC-%Pred-Pre: 106 %
FVC-%Change-Post: -4 %
FVC-%PRED-POST: 88 %
FVC-%Pred-Pre: 92 %
FVC-Post: 3.31 L
FVC-Pre: 3.47 L
POST FEV1/FVC RATIO: 78 %
Post FEV6/FVC ratio: 100 %
Pre FEV1/FVC ratio: 75 %
Pre FEV6/FVC Ratio: 99 %
RV % PRED: 76 %
RV: 1.93 L
TLC % PRED: 88 %
TLC: 5.92 L

## 2015-05-28 MED ORDER — ALBUTEROL SULFATE (2.5 MG/3ML) 0.083% IN NEBU
2.5000 mg | INHALATION_SOLUTION | Freq: Once | RESPIRATORY_TRACT | Status: AC
  Administered 2015-05-28: 2.5 mg via RESPIRATORY_TRACT

## 2015-06-01 ENCOUNTER — Encounter: Payer: Self-pay | Admitting: Physical Therapy

## 2015-06-01 ENCOUNTER — Ambulatory Visit: Payer: Medicare HMO | Admitting: Physical Therapy

## 2015-06-01 DIAGNOSIS — R2681 Unsteadiness on feet: Secondary | ICD-10-CM

## 2015-06-01 DIAGNOSIS — R262 Difficulty in walking, not elsewhere classified: Secondary | ICD-10-CM | POA: Diagnosis not present

## 2015-06-01 DIAGNOSIS — M6281 Muscle weakness (generalized): Secondary | ICD-10-CM

## 2015-06-01 NOTE — Therapy (Signed)
[facility], [place], [address] Phone: [phone]   Fax:  [phone]  Physical Therapy Treatment  Patient Details  Name: Carl Harper MRN: [id number] Date of Birth: Nov 20, 1934 Referring Provider: Coletta Memos  Encounter Date: 06/01/2015      PT End of Session - 06/01/15 1138    Visit Number 3   Date for PT Re-Evaluation 07/18/15   PT Start Time 1100   PT Stop Time 1139   PT Time Calculation (min) 39 min      Past Medical History  Diagnosis Date  . Hypertension   . PAD (peripheral artery disease) ([place])   . Sleep apnea     dx'd; wore mask; lost weight; turned in machine"  . Dysrhythmia     "irregular"  . Daily headache   . Benign neoplasm of colon 10/24/2006  . Diverticulosis 09/03.2008  . Internal hemorrhoids without mention of complication 97/03/6376  . Restless leg syndrome   . BPH (benign prostatic hyperplasia)   . Memory loss     Past Surgical History  Procedure Laterality Date  . Iliac artery stent Right 11/07/2011    Diamondback orbital rotational atherectomy, PTA & stenting of calcified R CIA (Dr. Adora Fridge)  . Shoulder arthroscopy w/ rotator cuff repair Left 2010  . Appendectomy  ~ 1954  . Cataract extraction w/ intraocular lens  implant, bilateral  2011-2012  . Cardiac catheterization  03/30/1999    non-critical CAD  . Sleep study  01/09/2011    AHI during total sleep 14.4/hr and during REM 19.4/hr  . Transthoracic echocardiogram  09/2012    EF 55-60%, LA mildly dilated  . Nm myocar perf wall motion  09/2012    lexiscan myoview - low risk with fixed inferior defect with underlying bowel attenuation suggestive of artifact  . Lower extremity angiogram N/A 11/07/2011    Procedure: LOWER EXTREMITY ANGIOGRAM;  Surgeon: Lorretta Harp, MD;  Location: [facility] CATH LAB;  Service: Cardiovascular;  Laterality: N/A;  . Atherectomy Right 11/07/2011    Procedure: ATHERECTOMY;  Surgeon: Lorretta Harp, MD;  Location: [facility] CATH LAB;  Service: Cardiovascular;  Laterality: Right;  . Percutaneous stent intervention  11/07/2011    Procedure: PERCUTANEOUS STENT INTERVENTION;  Surgeon: Lorretta Harp, MD;  Location: [facility] CATH LAB;  Service: Cardiovascular;;    There were no vitals filed for this visit.      Subjective Assessment - 06/01/15 1102    Subjective "I feel the same" No falls but stumbles all the time    Currently in Pain? No/denies   Pain Score 0-No pain                         [facility] Adult PT Treatment/Exercise - 06/01/15 0001    High Level Balance   High Level Balance Activities Side stepping;Backward walking;Marching forwards   High Level Balance Comments Walking march #3; standing march on airex; On airex ball toss    Exercises   Exercises Knee/Hip;Lumbar   Lumbar Exercises: Machines for Strengthening   Cybex Knee Extension #10 2x15    Cybex Knee Flexion #25 2x15    Leg Press #20 2x15    Lumbar Exercises: Seated   Other Seated Lumbar Exercises Rows and Lats #35 2x15   Knee/Hip Exercises: Aerobic   Nustep Level 1 x 7 minutes   Knee/Hip Exercises: Standing   Hip Abduction 2 sets;10 reps   Abduction Limitations  3   Hip Extension 2 sets;10 reps   Extension Limitations 3                  PT Short Term Goals - 05/18/15 1513    PT SHORT TERM GOAL #1   Title independent with initial HEP   Time 2   Period Weeks   Status New           PT Long Term Goals - 06/01/15 1135    PT LONG TERM GOAL #3   Title return to walking 2 miles for exercise   Status Partially Met               Plan - 06/01/15 1138    Clinical Impression Statement Performed well this treatment date. Pt demos better stability with standing balance activities. Continues to need tactile cues for positioning on airex pad. Pt reports that he has returned to walking 1-2 miles a day.   Rehab Potential Good   PT Frequency 2x / week   PT Duration 8 weeks   PT  Treatment/Interventions ADLs/Self Care Home Management;Functional mobility training;Gait training;Therapeutic activities;Therapeutic exercise;Manual techniques;Patient/family education;Balance training;Neuromuscular re-education;Passive range of motion   PT Next Visit Plan progress with LE strength and balance      Patient will benefit from skilled therapeutic intervention in order to improve the following deficits and impairments:  Abnormal gait, Decreased activity tolerance, Decreased mobility, Decreased range of motion, Decreased strength, Difficulty walking, Impaired flexibility, Pain  Visit Diagnosis: Difficulty in walking, not elsewhere classified  Muscle weakness (generalized)  Gait instability     Problem List Patient Active Problem List   Diagnosis Date Noted  . Atrial flutter ([place]) 10/28/2014  . DOE (dyspnea on exertion) 10/10/2013  . PVC's (premature ventricular contractions) 09/26/2013  . OSA on CPAP 03/31/2013  . Hx of adenomatous colonic polyps 02/28/2012  . PAD (peripheral artery disease) ([place]) 11/08/2011  . HTN (hypertension) 11/08/2011  . Claudication [facility]) 11/08/2011    Scot Jun, PTA 06/01/2015, 11:42 AM  [place], [place], [address] Phone: [phone]   Fax:  [phone]  Name: Carl Harper MRN: [id number] Date of Birth: October 13, 1934

## 2015-06-02 ENCOUNTER — Other Ambulatory Visit: Payer: Self-pay | Admitting: *Deleted

## 2015-06-02 DIAGNOSIS — Z79899 Other long term (current) drug therapy: Secondary | ICD-10-CM

## 2015-06-03 ENCOUNTER — Ambulatory Visit: Payer: Medicare HMO | Admitting: Physical Therapy

## 2015-06-03 NOTE — Addendum Note (Signed)
Addended by: Sumner Boast on: 06/03/2015 04:44 PM   Modules accepted: Orders

## 2015-06-08 ENCOUNTER — Encounter: Payer: Self-pay | Admitting: Physical Therapy

## 2015-06-08 ENCOUNTER — Ambulatory Visit: Payer: Medicare HMO | Admitting: Physical Therapy

## 2015-06-08 DIAGNOSIS — R2689 Other abnormalities of gait and mobility: Secondary | ICD-10-CM

## 2015-06-08 DIAGNOSIS — R262 Difficulty in walking, not elsewhere classified: Secondary | ICD-10-CM | POA: Diagnosis not present

## 2015-06-08 DIAGNOSIS — M6281 Muscle weakness (generalized): Secondary | ICD-10-CM

## 2015-06-08 NOTE — Therapy (Signed)
[place], [place], [address] Phone: [phone]   Fax:  [phone]  Physical Therapy Treatment  Patient Details  Name: Carl Harper MRN: [id number] Date of Birth: May 27, 1934 Referring Provider: Coletta Memos  Encounter Date: 06/08/2015      PT End of Session - 06/08/15 1513    Visit Number 4   Date for PT Re-Evaluation 07/18/15   PT Start Time 1430   PT Stop Time 1572   PT Time Calculation (min) 44 min   Activity Tolerance Patient tolerated treatment well   Behavior During Therapy [facility] for tasks assessed/performed      Past Medical History  Diagnosis Date  . Hypertension   . PAD (peripheral artery disease) ([place])   . Sleep apnea     dx'd; wore mask; lost weight; turned in machine"  . Dysrhythmia     "irregular"  . Daily headache   . Benign neoplasm of colon 10/24/2006  . Diverticulosis 09/03.2008  . Internal hemorrhoids without mention of complication 62/04/5595  . Restless leg syndrome   . BPH (benign prostatic hyperplasia)   . Memory loss     Past Surgical History  Procedure Laterality Date  . Iliac artery stent Right 11/07/2011    Diamondback orbital rotational atherectomy, PTA & stenting of calcified R CIA (Dr. Adora Fridge)  . Shoulder arthroscopy w/ rotator cuff repair Left 2010  . Appendectomy  ~ 1954  . Cataract extraction w/ intraocular lens  implant, bilateral  2011-2012  . Cardiac catheterization  03/30/1999    non-critical CAD  . Sleep study  01/09/2011    AHI during total sleep 14.4/hr and during REM 19.4/hr  . Transthoracic echocardiogram  09/2012    EF 55-60%, LA mildly dilated  . Nm myocar perf wall motion  09/2012    lexiscan myoview - low risk with fixed inferior defect with underlying bowel attenuation suggestive of artifact  . Lower extremity angiogram N/A 11/07/2011    Procedure: LOWER EXTREMITY ANGIOGRAM;  Surgeon: Lorretta Harp, MD;  Location: [facility] C[facility] LAB;   Service: Cardiovascular;  Laterality: N/A;  . Atherectomy Right 11/07/2011    Procedure: ATHERECTOMY;  Surgeon: Lorretta Harp, MD;  Location: [facility] CATH LAB;  Service: Cardiovascular;  Laterality: Right;  . Percutaneous stent intervention  11/07/2011    Procedure: PERCUTANEOUS STENT INTERVENTION;  Surgeon: Lorretta Harp, MD;  Location: [facility] CATH LAB;  Service: Cardiovascular;;    There were no vitals filed for this visit.      Subjective Assessment - 06/08/15 1433    Subjective "No falls, about 3 or 4 stumbles a day" Pt reports that he gets tired quicker   Currently in Pain? No/denies   Pain Score 0-No pain                         [facility] Adult PT Treatment/Exercise - 06/08/15 0001    Ambulation/Gait   Ambulation/Gait Yes   Ambulation/Gait Assistance 5: Supervision   Ambulation Distance (Feet) --  >400 ft   Assistive device None   Gait Pattern Step-through pattern;Within Functional Limits   Ambulation Surface Unlevel;Outdoor;Paved   Stairs Yes   Stairs Assistance 6: Modified independent (Device/Increase time)   Stair Management Technique One rail Right   Number of Stairs 24   Height of Stairs 6   Gait Comments Gait around building up and down slopes, Stair negotiation 1 flight   High Level Balance  High Level Balance Activities Negotiating over obstacles  Side stepping over objects    Lumbar Exercises: Machines for Strengthening   Cybex Knee Extension #15 2x15    Cybex Knee Flexion #25 2x15    Leg Press #35 2x15    Lumbar Exercises: Standing   Other Standing Lumbar Exercises Standing Rows #35 x15   Lumbar Exercises: Seated   Sit to Stand 10 reps  2 sets OHP with blue ball    Knee/Hip Exercises: Aerobic   Nustep Level 3 x 7 minutes                  PT Short Term Goals - 05/18/15 1513    PT SHORT TERM GOAL #1   Title independent with initial HEP   Time 2   Period Weeks   Status New           PT Long Term Goals - 06/01/15 1135    PT  LONG TERM GOAL #3   Title return to walking 2 miles for exercise   Status Partially Met               Plan - 06/08/15 1514    Clinical Impression Statement Pt able to tolerate outdoor ambulation and stair negotiation Mod I. Most difficulty with tandem walking. Performed all strengthening exercises well.     Rehab Potential Good   PT Frequency 2x / week   PT Duration 8 weeks   PT Treatment/Interventions ADLs/Self Care Home Management;Functional mobility training;Gait training;Therapeutic activities;Therapeutic exercise;Manual techniques;Patient/family education;Balance training;Neuromuscular re-education;Passive range of motion   PT Next Visit Plan BERG      Patient will benefit from skilled therapeutic intervention in order to improve the following deficits and impairments:  Abnormal gait, Decreased activity tolerance, Decreased mobility, Decreased range of motion, Decreased strength, Difficulty walking, Impaired flexibility, Pain  Visit Diagnosis: Difficulty in walking, not elsewhere classified  Muscle weakness (generalized)  Other abnormalities of gait and mobility     Problem List Patient Active Problem List   Diagnosis Date Noted  . Atrial flutter ([place]) 10/28/2014  . DOE (dyspnea on exertion) 10/10/2013  . PVC's (premature ventricular contractions) 09/26/2013  . OSA on CPAP 03/31/2013  . Hx of adenomatous colonic polyps 02/28/2012  . PAD (peripheral artery disease) ([place]) 11/08/2011  . HTN (hypertension) 11/08/2011  . Claudication [facility]) 11/08/2011    Scot Jun, PTA  06/08/2015, 3:16 PM  [place], [place], [address] Phone: [phone]   Fax:  [phone]  Name: Carl Harper MRN: [id number] Date of Birth: June 08, 1934

## 2015-06-15 ENCOUNTER — Encounter: Payer: Self-pay | Admitting: Physical Therapy

## 2015-06-15 ENCOUNTER — Ambulatory Visit: Payer: Medicare HMO | Admitting: Physical Therapy

## 2015-06-15 DIAGNOSIS — R2689 Other abnormalities of gait and mobility: Secondary | ICD-10-CM

## 2015-06-15 DIAGNOSIS — R262 Difficulty in walking, not elsewhere classified: Secondary | ICD-10-CM | POA: Diagnosis not present

## 2015-06-15 DIAGNOSIS — M6281 Muscle weakness (generalized): Secondary | ICD-10-CM

## 2015-06-15 NOTE — Therapy (Signed)
[place], [place], [address] Phone: [phone]   Fax:  [phone]  Physical Therapy Treatment  Patient Details  Name: Carl Harper MRN: [id number] Date of Birth: 20-Mar-1934 Referring Provider: Coletta Memos  Encounter Date: 06/15/2015      PT End of Session - 06/15/15 1549    Visit Number 5   Date for PT Re-Evaluation 07/18/15   PT Start Time [id number]   PT Stop Time 1549   PT Time Calculation (min) 34 min   Activity Tolerance Patient tolerated treatment well   Behavior During Therapy [facility] for tasks assessed/performed      Past Medical History  Diagnosis Date  . Hypertension   . PAD (peripheral artery disease) ([place])   . Sleep apnea     dx'd; wore mask; lost weight; turned in machine"  . Dysrhythmia     "irregular"  . Daily headache   . Benign neoplasm of colon 10/24/2006  . Diverticulosis 09/03.2008  . Internal hemorrhoids without mention of complication [id number]  . Restless leg syndrome   . BPH (benign prostatic hyperplasia)   . Memory loss     Past Surgical History  Procedure Laterality Date  . Iliac artery stent Right 11/07/2011    Diamondback orbital rotational atherectomy, PTA & stenting of calcified R CIA (Dr. Adora Fridge)  . Shoulder arthroscopy w/ rotator cuff repair Left 2010  . Appendectomy  ~ 1954  . Cataract extraction w/ intraocular lens  implant, bilateral  2011-2012  . Cardiac catheterization  03/30/1999    non-critical CAD  . Sleep study  01/09/2011    AHI during total sleep 14.4/hr and during REM 19.4/hr  . Transthoracic echocardiogram  09/2012    EF 55-60%, LA mildly dilated  . Nm myocar perf wall motion  09/2012    lexiscan myoview - low risk with fixed inferior defect with underlying bowel attenuation suggestive of artifact  . Lower extremity angiogram N/A 11/07/2011    Procedure: LOWER EXTREMITY ANGIOGRAM;  Surgeon: Lorretta Harp, MD;  Location[facility] CATH LAB;   Service: Cardiovascular;  Laterality: N/A;  . Atherectomy Right 11/07/2011    Procedure: ATHERECTOMY;  Surgeon: Lorretta Harp, MD;  Location: [facility] CATH LAB;  Service: Cardiovascular;  Laterality: Right;  . Percutaneous stent intervention  11/07/2011    Procedure: PERCUTANEOUS STENT INTERVENTION;  Surgeon: Lorretta Harp, MD;  Location: [facility] CATH LAB;  Service: Cardiovascular;;    There were no vitals filed for this visit.      Subjective Assessment - 06/15/15 1518    Subjective "Not too great, I felt a little shaky"   Currently in Pain? No/denies   Pain Score 0-No pain            [facility] PT Assessment - 06/15/15 0001    Standardized Balance Assessment   Standardized Balance Assessment Timed Up and Go Test;Berg Balance Test   Berg Balance Test   Sit to Stand Able to stand without using hands and stabilize independently   Standing Unsupported Able to stand safely 2 minutes   Sitting with Back Unsupported but Feet Supported on Floor or Stool Able to sit safely and securely 2 minutes   Stand to Sit Sits safely with minimal use of hands   Transfers Able to transfer safely, minor use of hands   Standing Unsupported with Eyes Closed Able to stand 10 seconds safely   Standing Ubsupported with Feet Together Able to place feet together independently  and stand 1 minute safely   From Standing, Reach Forward with Outstretched Arm Can reach confidently >25 cm (10")   From Standing Position, Pick up Object from [place] to pick up shoe safely and easily   From Standing Position, Turn to Look Behind Over each Shoulder Looks behind from both sides and weight shifts well   Turn 360 Degrees Able to turn 360 degrees safely in 4 seconds or less   Standing Unsupported, Alternately Place Feet on Step/Stool Able to stand independently and safely and complete 8 steps in 20 seconds   Standing Unsupported, One Foot in [place] to place foot tandem independently and hold 30 seconds   Standing on One Leg  Able to lift leg independently and hold 5-10 seconds   Total Score 55   Timed Up and Go Test   Normal TUG (seconds) 8.63                     [facility] Adult PT Treatment/Exercise - 06/15/15 0001    High Level Balance   High Level Balance Comments resisted walking  with black Tband frd and bak ~40 feet x4   Lumbar Exercises: Machines for Strengthening   Leg Press #40 2x15    Lumbar Exercises: Seated   Other Seated Lumbar Exercises Rows and Lats #35 2x15   Knee/Hip Exercises: Aerobic   Nustep Level 3 x 7 minutes                  PT Short Term Goals - 06/15/15 1522    PT SHORT TERM GOAL #1   Title independent with initial HEP   Status Achieved           PT Long Term Goals - 06/15/15 1533    PT LONG TERM GOAL #1   Title decrease timed up and go test to 13 seconds   Status Achieved   PT LONG TERM GOAL #2   Title increase berg balance test score to 50/56   Status Achieved               Plan - 06/15/15 1549    Clinical Impression Statement Pt has progressed meeting both BERG balance and TUG goals. Pt unable to walk outside due to weather. Short step length with resisted backwards walking with some nervousness.   Rehab Potential Good   PT Frequency 2x / week   PT Duration 8 weeks   PT Treatment/Interventions ADLs/Self Care Home Management;Functional mobility training;Gait training;Therapeutic activities;Therapeutic exercise;Manual techniques;Patient/family education;Balance training;Neuromuscular re-education;Passive range of motion   PT Next Visit Plan resisted walking, Possible D/C      Patient will benefit from skilled therapeutic intervention in order to improve the following deficits and impairments:  Abnormal gait, Decreased activity tolerance, Decreased mobility, Decreased range of motion, Decreased strength, Difficulty walking, Impaired flexibility, Pain  Visit Diagnosis: Difficulty in walking, not elsewhere classified  Muscle weakness  (generalized)  Other abnormalities of gait and mobility     Problem List Patient Active Problem List   Diagnosis Date Noted  . Atrial flutter ([place]) 10/28/2014  . DOE (dyspnea on exertion) 10/10/2013  . PVC's (premature ventricular contractions) 09/26/2013  . OSA on CPAP 03/31/2013  . Hx of adenomatous colonic polyps 02/28/2012  . PAD (peripheral artery disease) ([place]) 11/08/2011  . HTN (hypertension) 11/08/2011  . Claudication [facility]) 11/08/2011    Scot Jun, PTA  06/15/2015, 3:54 PM  [place]  [place], [place], [address] Phone: [phone]   Fax:  [phone]  Name: Carl Harper MRN: [id number] Date of Birth: Sep 25, 1934

## 2015-06-17 ENCOUNTER — Ambulatory Visit: Payer: Medicare HMO | Admitting: Physical Therapy

## 2015-06-30 ENCOUNTER — Other Ambulatory Visit: Payer: Self-pay | Admitting: Internal Medicine

## 2015-06-30 NOTE — Telephone Encounter (Signed)
Rx has been sent to the pharmacy electronically. ° °

## 2015-07-26 ENCOUNTER — Other Ambulatory Visit: Payer: Self-pay | Admitting: Internal Medicine

## 2015-07-27 NOTE — Telephone Encounter (Signed)
Rx(s) sent to pharmacy electronically.  

## 2015-09-23 ENCOUNTER — Other Ambulatory Visit: Payer: Self-pay | Admitting: Cardiology

## 2015-09-29 ENCOUNTER — Telehealth: Payer: Self-pay | Admitting: Internal Medicine

## 2015-09-29 NOTE — Telephone Encounter (Signed)
Stop amiodarone due to corneal side effects. Please refer him back to Dr. Curt Bears who started amiodarone in November, to evaluate alternative antiarrythmics or ablation.  Dr. Lemmie Evens

## 2015-09-29 NOTE — Telephone Encounter (Signed)
Returned call to [facility]. 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 [place] requests a call back once Dr Debara Pickett responds.  Routing to Dr Debara Pickett and his nurse.

## 2015-09-29 NOTE — Telephone Encounter (Signed)
Rise Paganini is calling from Dr. Trinna Post office because Mr. 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 Lucita Ferrara is saying he needs to come off if immediately . Please call if you have any questions ( when calling press Option 1) and ask to speak to [facility]

## 2015-09-30 NOTE — Telephone Encounter (Signed)
Called patient. Gave him instruction to stop the amiodarone. Advised patient that scheduling will be calling to schedule an appt with Dr Curt Bears as patient missed 6 month f/u back in May and someone will call him if there are any further recommendations from Dr Curt Bears. Patient verbalized understanding.

## 2015-09-30 NOTE — Telephone Encounter (Signed)
Returned call to [place] and gave her Dr Dillard's recommendation. She asked if I could call the patient. I will do so.

## 2015-10-05 ENCOUNTER — Encounter: Payer: Self-pay | Admitting: Cardiology

## 2015-10-05 NOTE — Telephone Encounter (Signed)
Patient has appt 10/07/15 with Dr Curt Bears.

## 2015-10-06 NOTE — Progress Notes (Signed)
Electrophysiology Office Note   Date:  10/07/2015   ID:  Carl Harper, DOB 1934-05-01, MRN [id number]  PCP:  Phineas Inches, MD  Cardiologist:  Lyman Bishop Primary Electrophysiologist:  Rabecca Birge Meredith Leeds, MD    Chief Complaint  Patient presents with  . Follow-up  . Chest Pain  . Shortness of Breath  . Dizziness     History of Present Illness: Carl Harper is a 81 y.o. male who presents today for electrophysiology evaluation.   He has a history of peripheral arterial disease. He underwent rotational atherectomy by Dr. Gwenlyn Found in 2013 with stenting of the calcified right common iliac stenosis. He has mild nonobstructive coronary disease by cath in 2001 and a negative Myoview in 2011. He also has obstructive sleep apnea on CPAP has had reflux symptoms and atypical chest pain from time to time.   He did undergo an exercise tolerance test for which he exercised for 6 minutes and 7 metabolic equivalents. There was no evidence of ischemia. He did have PVCs and a fairly flat blood pressure response to exercise. There was marked dyspnea with exertion.   Today, he denies symptoms of  claudication, dizziness, presyncope, syncope, bleeding, or neurologic sequela. The patient is tolerating medications without difficulties. His amiodarone was recently stopped as his ophthalmologist saw that he was having evidence of corneal deposits. Since that time, he has had a little bit more shortness of breath. His chest pain is relatively unchanged. He says that he has pain in the mornings that is a squeezing type pain that improves when he gets out of bed and moves around. He had stress testing in 2014 that was low risk.   Past Medical History:  Diagnosis Date  . Benign neoplasm of colon 10/24/2006  . BPH (benign prostatic hyperplasia)   . Daily headache   . Diverticulosis 09/03.2008  . Dysrhythmia    "irregular"  . Hypertension   . Internal hemorrhoids without mention of complication  [id number]  . Memory loss   . PAD (peripheral artery disease) ([place])   . Restless leg syndrome   . Sleep apnea    dx'd; wore mask; lost weight; turned in machine"   Past Surgical History:  Procedure Laterality Date  . APPENDECTOMY  ~ 1954  . ATHERECTOMY Right 11/07/2011   Procedure: ATHERECTOMY;  Surgeon: Lorretta Harp, MD;  Location: [facility] CATH LAB;  Service: Cardiovascular;  Laterality: Right;  . CARDIAC CATHETERIZATION  03/30/1999   non-critical CAD  . CATARACT EXTRACTION W/ INTRAOCULAR LENS  IMPLANT, BILATERAL  2011-2012  . ILIAC ARTERY STENT Right 11/07/2011   Diamondback orbital rotational atherectomy, PTA & stenting of calcified R CIA (Dr. Adora Fridge)  . LOWER EXTREMITY ANGIOGRAM N/A 11/07/2011   Procedure: LOWER EXTREMITY ANGIOGRAM;  Surgeon: Lorretta Harp, MD;  Location: [facility] CATH LAB;  Service: Cardiovascular;  Laterality: N/A;  . NM MYOCAR PERF WALL MOTION  09/2012   lexiscan myoview - low risk with fixed inferior defect with underlying bowel attenuation suggestive of artifact  . PERCUTANEOUS STENT INTERVENTION  11/07/2011   Procedure: PERCUTANEOUS STENT INTERVENTION;  Surgeon: Lorretta Harp, MD;  Location[facility] C[facility] LAB;  Service: Cardiovascular;;  . SHOULDER ARTHROSCOPY W/ ROTATOR CUFF REPAIR Left 2010  . Sleep Study  01/09/2011   AHI during total sleep 14.4/hr and during REM 19.4/hr  . TRANSTHORACIC ECHOCARDIOGRAM  09/2012   EF 55-60%, LA mildly dilated     Current Outpatient Prescriptions  Medication Sig Dispense Refill  . aspirin 81  MG tablet Take 81 mg by mouth daily.    Marland Kitchen ELIQUIS 5 MG TABS tablet TAKE 1 TABLET BY MOUTH TWICE DAILY 60 tablet 6  . EVENING PRIMROSE OIL PO Take 1,300 mg by mouth daily.    Marland Kitchen losartan (COZAAR) 50 MG tablet TAKE 1 TABLET (50 MG TOTAL) BY MOUTH DAILY. 90 tablet 1  . oxybutynin (DITROPAN XL) 15 MG 24 hr tablet Take 1 tablet by mouth daily.    . pantoprazole (PROTONIX) 40 MG tablet TAKE ONE TABLET   BY MOUTH   DAILY 90 tablet 0  . pramipexole  (MIRAPEX) 0.75 MG tablet Take 1 tablet by mouth daily.    . RESTASIS 0.05 % ophthalmic emulsion Apply 1 drop to eye 2 (two) times daily.    . rivastigmine (EXELON) 4.6 mg/24hr Place 4.6 mg onto the skin daily.  12  . Tamsulosin HCl (FLOMAX) 0.4 MG CAPS Take 0.4 mg by mouth daily after supper.    . traZODone (DESYREL) 50 MG tablet Take 2 tablets by mouth at bedtime.   2  . zolpidem (AMBIEN CR) 6.25 MG CR tablet Take 12.5 mg by mouth at bedtime as needed for sleep.      No current facility-administered medications for this visit.     Allergies:   Bee venom   Social History:  The patient  reports that he quit smoking about 9 years ago. His smoking use included Cigarettes. He has a 55.00 pack-year smoking history. He has never used smokeless tobacco. He reports that he drinks about 8.4 oz of alcohol per week . He reports that he does not use drugs.   Family History:  The patient's family history includes Cancer in his maternal grandfather; Cancer (age of onset: 60) in his brother; Heart Problems (age of onset: 3) in his father; Heart failure in his brother.    ROS:  Please see the history of present illness.   Otherwise, review of systems is positive for chest pain, leg swelling, orthopnea, palpitations, visual disturbance, DOE, constipation, balance problems, dizziness, easy bruising.   All other systems are reviewed and negative.    PHYSICAL EXAM: VS:  BP (!) 108/56   Pulse (!) 47   Ht 5\' 10"  (1.778 m)   Wt 185 lb 6.4 oz (84.1 kg)   BMI 26.60 kg/m  , BMI Body mass index is 26.6 kg/m. GEN: Well nourished, well developed, in no acute distress  HEENT: normal  Neck: no JVD, carotid bruits, or masses Cardiac: bradycardic; no murmurs, rubs, or gallops,no edema  Respiratory:  clear to auscultation bilaterally, normal work of breathing GI: soft, nontender, nondistended, + BS MS: no deformity or atrophy  Skin: warm and dry Neuro:  Strength and sensation are intact Psych: euthymic mood,  full affect  EKG:  EKG is ordered today. Personal review of the ekg ordered today shows sinus rhythm 1 degree AV block, PVC, rate 47, LAFB  Recent Labs: 12/18/2014: Hemoglobin 13.2; Platelets 240 05/21/2015: ALT 14; BUN 25; Creat 1.38; Potassium 4.8; Sodium 140; TSH 2.70    Lipid Panel     Component Value Date/Time   CHOL 189 04/11/2013 1058   TRIG 59 04/11/2013 1058   HDL 77 04/11/2013 1058   LDLCALC 100 (H) 04/11/2013 1058     Wt Readings from Last 3 Encounters:  10/07/15 185 lb 6.4 oz (84.1 kg)  05/21/15 188 lb (85.3 kg)  12/30/14 191 lb 12.8 oz (87 kg)      Other studies Reviewed: Additional studies/ records  that were reviewed today include: holter  Review of the above records today demonstrates:  PAF, SVT, short runs of VT and ventricular bigeminy. 2.2 second pause, lowest HR of 35.  32% PVCs  3/16 MPI:  No perfusion defects to suggest ischemia. EF 71%.  TTE 01/04/16 - Left ventricle: The cavity size was normal. Wall thickness was   normal. Systolic function was normal. The estimated ejection   fraction was in the range of 55% to 60%. Wall motion was normal;   there were no regional wall motion abnormalities. Left   ventricular diastolic function parameters were normal. - Left atrium: The atrium was mildly dilated.  ASSESSMENT AND PLAN:  1.  Atrial fib/flutter:  The vent monitor showed significant amounts of atrial fibrillation and atrial flutter. The patient is currently on Eliquis and had a CHADS2VASc score of at least 5 indicating that he does need to continue his anticoagulation. Amiodarone was stopped at the request of his eye doctor. He is in sinus rhythm today. We'll continue his anticoagulation.  2. PVCs:  On his 24 hour monitor he had 32% PVCs. Was previously on amiodarone which was stopped by his eye doctor. Due to the long half-life of amiodarone, further monitoring of this time would be of little value. We'll wait until the end of September and order a  48-hour monitor to determine if his PVCs have resumed. I did discuss with him the option of ablation PVCs have come back which may benefit him. It appears that his PVCs are coming from the RV outflow tract.  We'll also repeat his echocardiogram at that time to see if his EF is changed.  3. Chest pain: Appears atypical at this time. I told him that if he has more pain with exertion to call clinic back.   Current medicines are reviewed at length with the patient today.   The patient does not have concerns regarding his medicines.  The following changes were made today:  Amiodarone 200 mg per day  Labs/ tests ordered today include:  Orders Placed This Encounter  Procedures  . Holter monitor - 48 hour  . EKG 12-Lead     Disposition:   FU with Breyana Follansbee 6 months  Signed, Nilton Lave Meredith Leeds, MD  10/07/2015 11:20 AM     [facility] [address] [place] [address] [phone] (office) [phone] (fax)

## 2015-10-07 ENCOUNTER — Encounter: Payer: Self-pay | Admitting: Cardiology

## 2015-10-07 ENCOUNTER — Encounter (INDEPENDENT_AMBULATORY_CARE_PROVIDER_SITE_OTHER): Payer: Self-pay

## 2015-10-07 ENCOUNTER — Ambulatory Visit (INDEPENDENT_AMBULATORY_CARE_PROVIDER_SITE_OTHER): Payer: Medicare HMO | Admitting: Cardiology

## 2015-10-07 VITALS — BP 108/56 | HR 47 | Ht 70.0 in | Wt 185.4 lb

## 2015-10-07 DIAGNOSIS — I493 Ventricular premature depolarization: Secondary | ICD-10-CM

## 2015-10-07 DIAGNOSIS — I481 Persistent atrial fibrillation: Secondary | ICD-10-CM | POA: Diagnosis not present

## 2015-10-07 DIAGNOSIS — I4819 Other persistent atrial fibrillation: Secondary | ICD-10-CM

## 2015-10-07 NOTE — Patient Instructions (Addendum)
Medication Instructions:  Your physician has recommended you make the following change in your medication:  1) Stop Metoprolol    Labwork: None ordered   Testing/Procedures: Your physician has recommended that you wear a holter monitor. Holter monitors are medical devices that record the heart's electrical activity. Doctors most often use these monitors to diagnose arrhythmias. Arrhythmias are problems with the speed or rhythm of the heartbeat. The monitor is a small, portable device. You can wear one while you do your normal daily activities. This is usually used to diagnose what is causing palpitations/syncope (passing out).---48 hour end of Sept    Follow-Up: Your physician recommends that you schedule a follow-up appointment in: 3 months with Dr Curt Bears   Any Other Special Instructions Will Be Listed Below (If Applicable).     If you need a refill on your cardiac medications before your next appointment, please call your pharmacy.

## 2015-11-09 ENCOUNTER — Encounter: Payer: Self-pay | Admitting: Internal Medicine

## 2015-11-09 ENCOUNTER — Ambulatory Visit (INDEPENDENT_AMBULATORY_CARE_PROVIDER_SITE_OTHER): Payer: Medicare HMO | Admitting: Internal Medicine

## 2015-11-09 ENCOUNTER — Ambulatory Visit: Payer: Medicare HMO | Admitting: Internal Medicine

## 2015-11-09 VITALS — BP 108/68 | HR 60 | Ht 70.0 in | Wt 187.0 lb

## 2015-11-09 DIAGNOSIS — R29898 Other symptoms and signs involving the musculoskeletal system: Secondary | ICD-10-CM

## 2015-11-09 DIAGNOSIS — R0609 Other forms of dyspnea: Secondary | ICD-10-CM | POA: Diagnosis not present

## 2015-11-09 DIAGNOSIS — I493 Ventricular premature depolarization: Secondary | ICD-10-CM | POA: Diagnosis not present

## 2015-11-09 DIAGNOSIS — I4892 Unspecified atrial flutter: Secondary | ICD-10-CM

## 2015-11-09 NOTE — Patient Instructions (Signed)
Medication Instructions:  Your physician recommends that you continue on your current medications as directed. Please refer to the Current Medication list given to you today.  Labwork: Your physician recommends that you return for lab work in: TODAY-CBC, CMET, BNP, ESR   Testing/Procedures: Your physician has requested that you have an echocardiogram. Echocardiography is a painless test that uses sound waves to create images of your heart. It provides your doctor with information about the size and shape of your heart and how well your heart's chambers and valves are working. This procedure takes approximately one hour. There are no restrictions for this procedure.   Follow-Up: Your physician recommends that you schedule a follow-up appointment in: IN 3 MONTHS AFTER TESTING HAS BEEN COMPLETED.   Any Other Special Instructions Will Be Listed Below (If Applicable).  NEXT APPT  DATE:______________________________________  TIME:______________________________________AM/PM  IF ECHO COMED BACK ABNORMAL SOMEONE FROM THE OFFICE WILL CONTACT YOU AND MOVE YOUR APPOINTMENT SOONER TO SEE DR HILTY.   If you need a refill on your cardiac medications before your next appointment, please call your pharmacy.

## 2015-11-09 NOTE — Progress Notes (Signed)
OFFICE NOTE  Chief Complaint:  Dyspnea, recurrent PVC's  Primary Care Physician: Phineas Inches, MD  HPI:  Carl Harper is a pleasant 81 year old male previously followed Dr. Rollene Fare with a history of peripheral arterial disease. He underwent diamondback orbital rotational atherectomy by Dr. Gwenlyn Found in 2013 with stenting of the calcified right common iliac stenosis. He has mild nonobstructive coronary disease by cath in 2001 and a negative Myoview in 2011. He also has obstructive sleep apnea on CPAP has had reflux symptoms and atypical chest pain from time to time.  His echocardiogram does show mild concentric LVH and borderline aortic root dilatation with a possible small ascending aortic aneurysm which will need to be followed. At his last visit with Dr. Rollene Fare his Toprol was cut down to 25 mg alternating with 12.5 mg every 2 some bradycardia.   Carl Harper was recently seen by Dr. Percival Spanish in the office for shortness of breath and palpitations as well as exertional dyspnea. He wore the monitor however that was never performed as no monitors were available. He did undergo an exercise tolerance test for which he exercised for 6 minutes and 7 metabolic equivalents. There was no evidence of ischemia. He did have PVCs and a fairly flat blood pressure response to exercise. There was marked dyspnea with exertion. He also is reported recent tremors, some memory loss and instability with his gait.  [name] returns today and is complaining of a sinking spell his chest. When pressed further he felt like he was possibly presyncopal occasionally has trouble getting his breath. He has some occasional dizziness. He was evaluated for monitor however none was placed because there was not a monitor available. He did have his metoprolol increased to 12.5/25 mg every other day. He seems to think that this is helped his symptoms and feels better than he had when he saw Dr. Percival Spanish.  I saw Carl Harper back  in the office today. He says that he call and make an appointment about a month ago because his been feeling some episodes of chest discomfort in the morning. He's also felt somewhat short of breath. An EKG in the office today demonstrates new onset atrial flutter with ventricular bigeminy at a rate of 77. This is a new diagnosis for him. I spent greater than 10 minutes discussing atrial fibrillation and anticoagulation options with him. He understands his options and the need for trying to establish a sinus rhythm.  Carl Harper returns today the office for follow-up. His EKG today fortunately shows sinus rhythm with PVCs. This is good news as we will not need to schedule him for a cardioversion. He has been taking Eliquis and feels that this is tolerable. He denies any bleeding problems. Here she does feel improvement in energy and I suspect this is from converting back to sinus rhythm at some point. Based on his TIA event, I suspect it may been related to paroxysmal atrial flutter. He did have carotid Dopplers which show very mild bilateral carotid disease and are not likely the source of his TIA event.  I saw Carl Harper back today in the office. He is maintaining sinus bradycardia with first degree AV block at a rate of 52. He denies any bleeding problems on Eliquis. He is on low-dose amiodarone. This was started by Dr. Curt Bears on 12/30/14, after monitor was placed which showed a very high burden of PVCs greater than 30% as well as A. fib. A repeat echo was performed which showed preserved  LV systolic function. Carl Harper was symptomatic with his PVCs. He reports a marked reduction in his symptoms after starting the amiodarone. I do not see these had baseline pulmonary function testing, thyroid or liver function tests.  11/09/2015  Carl Harper returns today for follow-up. He has had recent worsening shortness of breath and significant fatigue and leg weakness with exercise. This is of fairly recent  finding. He was seen by Dr. Curt Bears, who noted that recently Carl Harper ophthalmologist had recommended discontinuing amiodarone due to corneal deposits. Since that time, he has been having a washout of the amiodarone with the plan to reassess his burden of PVCs by monitoring at the end of September. This is a ready been scheduled. His last echo was in 2016 and showed normal LV function, but he has had new symptoms as described above and would likely benefit from a repeat echo to rule out cardiomyopathy.  PMHx:  Past Medical History:  Diagnosis Date  . Benign neoplasm of colon 10/24/2006  . BPH (benign prostatic hyperplasia)   . Daily headache   . Diverticulosis 09/03.2008  . Dysrhythmia    "irregular"  . Hypertension   . Internal hemorrhoids without mention of complication 67/34/1937  . Memory loss   . PAD (peripheral artery disease) ([place])   . Restless leg syndrome   . Sleep apnea    dx'd; wore mask; lost weight; turned in machine"    Past Surgical History:  Procedure Laterality Date  . APPENDECTOMY  ~ 1954  . ATHERECTOMY Right 11/07/2011   Procedure: ATHERECTOMY;  Surgeon: Lorretta Harp, MD;  Location: [facility] CATH LAB;  Service: Cardiovascular;  Laterality: Right;  . CARDIAC CATHETERIZATION  03/30/1999   non-critical CAD  . CATARACT EXTRACTION W/ INTRAOCULAR LENS  IMPLANT, BILATERAL  2011-2012  . ILIAC ARTERY STENT Right 11/07/2011   Diamondback orbital rotational atherectomy, PTA & stenting of calcified R CIA (Dr. Adora Fridge)  . LOWER EXTREMITY ANGIOGRAM N/A 11/07/2011   Procedure: LOWER EXTREMITY ANGIOGRAM;  Surgeon: Lorretta Harp, MD;  Location: [facility] CATH LAB;  Service: Cardiovascular;  Laterality: N/A;  . NM MYOCAR PERF WALL MOTION  09/2012   lexiscan myoview - low risk with fixed inferior defect with underlying bowel attenuation suggestive of artifact  . PERCUTANEOUS STENT INTERVENTION  11/07/2011   Procedure: PERCUTANEOUS STENT INTERVENTION;  Surgeon: Lorretta Harp, MD;   Location: [facility] CATH LAB;  Service: Cardiovascular;;  . SHOULDER ARTHROSCOPY W/ ROTATOR CUFF REPAIR Left 2010  . Sleep Study  01/09/2011   AHI during total sleep 14.4/hr and during REM 19.4/hr  . TRANSTHORACIC ECHOCARDIOGRAM  09/2012   EF 55-60%, LA mildly dilated    FAMHx:  Family History  Problem Relation Age of Onset  . Heart Problems Father 20  . Cancer Brother 73  . Heart failure Brother   . Cancer Maternal Grandfather     prostate    SOCHx:   reports that he quit smoking about 9 years ago. His smoking use included Cigarettes. He has a 55.00 pack-year smoking history. He has never used smokeless tobacco. He reports that he drinks about 8.4 oz of alcohol per week . He reports that he does not use drugs.  ALLERGIES:  Allergies  Allergen Reactions  . Bee Venom Other (See Comments)    unknown    ROS: Pertinent items noted in HPI and remainder of comprehensive ROS otherwise negative.  HOME MEDS: Current Outpatient Prescriptions  Medication Sig Dispense Refill  . aspirin 81 MG tablet Take  81 mg by mouth daily.    Marland Kitchen ELIQUIS 5 MG TABS tablet TAKE 1 TABLET BY MOUTH TWICE DAILY 60 tablet 6  . EVENING PRIMROSE OIL PO Take 1,300 mg by mouth daily.    Marland Kitchen losartan (COZAAR) 50 MG tablet TAKE 1 TABLET (50 MG TOTAL) BY MOUTH DAILY. 90 tablet 1  . pantoprazole (PROTONIX) 40 MG tablet TAKE ONE TABLET   BY MOUTH   DAILY 90 tablet 0  . pramipexole (MIRAPEX) 0.75 MG tablet Take 1 tablet by mouth daily.    . RESTASIS 0.05 % ophthalmic emulsion Apply 1 drop to eye 2 (two) times daily.    . Tamsulosin HCl (FLOMAX) 0.4 MG CAPS Take 0.4 mg by mouth daily after supper.    . zolpidem (AMBIEN CR) 6.25 MG CR tablet Take 12.5 mg by mouth at bedtime as needed for sleep.      No current facility-administered medications for this visit.     LABS/IMAGING: No results found for this or any previous visit (from the past 48 hour(s)). No results found.  VITALS: BP 108/68   Pulse 60   Ht 5' 10"  (1.778 m)    Wt 187 lb (84.8 kg)   BMI 26.83 kg/m   EXAM: General appearance: alert and no distress Neck: no carotid bruit and no JVD Lungs: clear to auscultation bilaterally Heart: regular rate and rhythm, S1, S2 normal, no murmur, click, rub or gallop Abdomen: soft, non-tender; bowel sounds normal; no masses,  no organomegaly Extremities: extremities normal, atraumatic, no cyanosis or edema Pulses: 2+ and symmetric Skin: Skin color, texture, turgor normal. No rashes or lesions Neurologic: Mental status: Alert, oriented, thought content appropriate Motor: Strength 4/5 hip extensors and leg extensors Psych: Pleasant  EKG: Sinus rhythm with first degree AV block and PVCs at 60, left anterior fascicular block  ASSESSMENT: 1. New onset atrial flutter with frequent PVC's - CHADSVASC score of 4 on Eliquis, now on amiodarone in sinus bradycardia 2. PAD status post diamondback were orbital atherectomy and stenting of the right common iliac artery 3. Hypertension 4. Dyslipidemia 5. Mild CAD 6. Obstructive sleep apnea on CPAP 7. DOE - negative treadmill stress test 8. Tremor/memory loss/gait abnormalities - no evidence of Parkinson's by recent neurology consult  PLAN: 1.   Carl Harper unfortunately did well with amiodarone but had corneal deposits and there was concern for vision loss. It was therefore discontinued and he is noted to have more PVCs. His burden in the past was quite high and will be undergoing repeat monitoring once the amiodarone washes out at the end of September. He reports marked shortness of breath with exertion and weakness in his legs. On exam today his strength is 4 out of 5 with hip flexors and leg extensors. He is able to get up out of the chair without using his arms. I suspect his weakness is more subjective and related to fatigue and shortness of breath, possibly related to heart failure. I recommend a repeat echocardiogram. We'll also check CBC, CMET, BNP and ESR (to r/o  PMR).   Follow-up in 3 months.  Pixie Casino, MD, [facility] Attending Cardiologist [place] [facility] Rahim Astorga 11/09/2015, 5:50 PM

## 2015-11-10 LAB — COMPREHENSIVE METABOLIC PANEL
ALBUMIN: 3.9 g/dL (ref 3.6–5.1)
ALK PHOS: 58 U/L (ref 40–115)
ALT: 20 U/L (ref 9–46)
AST: 22 U/L (ref 10–35)
BILIRUBIN TOTAL: 0.5 mg/dL (ref 0.2–1.2)
BUN: 20 mg/dL (ref 7–25)
CALCIUM: 9.1 mg/dL (ref 8.6–10.3)
CO2: 25 mmol/L (ref 20–31)
Chloride: 106 mmol/L (ref 98–110)
Creat: 1.28 mg/dL — ABNORMAL HIGH (ref 0.70–1.18)
Glucose, Bld: 97 mg/dL (ref 65–99)
Potassium: 4.9 mmol/L (ref 3.5–5.3)
Sodium: 139 mmol/L (ref 135–146)
Total Protein: 6.5 g/dL (ref 6.1–8.1)

## 2015-11-11 LAB — SEDIMENTATION RATE: Sed Rate: 1 mm/hr (ref 0–20)

## 2015-11-11 LAB — BRAIN NATRIURETIC PEPTIDE: Brain Natriuretic Peptide: 116.9 pg/mL — ABNORMAL HIGH (ref <100)

## 2015-11-12 ENCOUNTER — Telehealth: Payer: Self-pay | Admitting: *Deleted

## 2015-11-12 MED ORDER — FUROSEMIDE 20 MG PO TABS: 20.0000 mg | ORAL_TABLET | Freq: Every day | ORAL | 12 refills | Status: DC

## 2015-11-12 NOTE — Telephone Encounter (Signed)
-----   Message from Pixie Casino, MD sent at 11/11/2015 11:20 AM EDT ----- Mildly elevated BNP - start lasix 20 mg daily to see if it improves his shortness of breath.  Dr. Lemmie Evens

## 2015-11-12 NOTE — Telephone Encounter (Deleted)
-----   Message from Pixie Casino, MD sent at 11/11/2015 11:20 AM EDT ----- Mildly elevated BNP - start lasix 20 mg daily to see if it improves his shortness of breath.  Dr. Lemmie Evens

## 2015-11-12 NOTE — Telephone Encounter (Signed)
Spoke with pt, aware of dr hilty's recommendations. New script sent to the pharmacy

## 2015-11-15 ENCOUNTER — Telehealth: Payer: Self-pay | Admitting: Internal Medicine

## 2015-11-15 NOTE — Telephone Encounter (Signed)
New message ° ° ° ° ° ° °Pt returning nurse call  °

## 2015-11-15 NOTE — Telephone Encounter (Signed)
Unable to locate documentation of a call.

## 2015-11-15 NOTE — Telephone Encounter (Signed)
Spoke to [employer] (Wife, DPR) who noted patient was set up for holter monitor. Gave instructions on appt and where to go for this, and advised to call if any further needs. She voiced understanding and thanks.

## 2015-11-18 ENCOUNTER — Ambulatory Visit (INDEPENDENT_AMBULATORY_CARE_PROVIDER_SITE_OTHER): Payer: Medicare HMO

## 2015-11-18 DIAGNOSIS — I4819 Other persistent atrial fibrillation: Secondary | ICD-10-CM

## 2015-11-18 DIAGNOSIS — I481 Persistent atrial fibrillation: Secondary | ICD-10-CM | POA: Diagnosis not present

## 2015-11-24 ENCOUNTER — Other Ambulatory Visit: Payer: Self-pay

## 2015-11-24 ENCOUNTER — Ambulatory Visit (HOSPITAL_COMMUNITY): Payer: Medicare HMO | Attending: Cardiovascular Disease

## 2015-11-24 DIAGNOSIS — I4892 Unspecified atrial flutter: Secondary | ICD-10-CM

## 2015-11-24 DIAGNOSIS — I493 Ventricular premature depolarization: Secondary | ICD-10-CM | POA: Diagnosis not present

## 2015-11-25 ENCOUNTER — Ambulatory Visit: Payer: Medicare HMO | Admitting: Internal Medicine

## 2015-12-13 ENCOUNTER — Telehealth: Payer: Self-pay | Admitting: Internal Medicine

## 2015-12-13 NOTE — Telephone Encounter (Signed)
Pt said he received a call on Friday,concerning his echo results. He is returning the call today.

## 2015-12-13 NOTE — Telephone Encounter (Signed)
Returned call to patient.He stated he was returning a call for monitor results.Advised Dr.Camnitz wanted you to schedule appointment with him to discuss results.Stated he has appointment wit Dr.Camnitz 01/11/16 at 10:45 am.Advised I will send message to his RN Judeen Hammans to see if you need a sooner appointment.

## 2015-12-14 NOTE — Telephone Encounter (Signed)
Rescheduled patient to see Camnitz this Thursday. Patient verbalized understanding and agreeable to plan.

## 2015-12-16 ENCOUNTER — Encounter: Payer: Self-pay | Admitting: Cardiology

## 2015-12-16 ENCOUNTER — Ambulatory Visit (INDEPENDENT_AMBULATORY_CARE_PROVIDER_SITE_OTHER): Payer: Medicare HMO | Admitting: Cardiology

## 2015-12-16 VITALS — BP 102/60 | HR 56 | Ht 70.0 in | Wt 188.8 lb

## 2015-12-16 DIAGNOSIS — I493 Ventricular premature depolarization: Secondary | ICD-10-CM | POA: Diagnosis not present

## 2015-12-16 MED ORDER — MEXILETINE HCL 150 MG PO CAPS: 150.0000 mg | ORAL_CAPSULE | Freq: Two times a day (BID) | ORAL | 6 refills | Status: DC

## 2015-12-16 NOTE — Progress Notes (Signed)
Electrophysiology Office Note   Date:  12/16/2015   ID:  Carl Harper, DOB [birth year], MRN [id number]  PCP:  Phineas Inches, MD  Cardiologist:  Lyman Bishop Primary Electrophysiologist:  Lyriq Jarchow Meredith Leeds, MD    Chief Complaint  Patient presents with  . Follow-up    PVC"S     History of Present Illness: Carl Harper is a 81 y.o. male who presents today for electrophysiology evaluation.   He has a history of peripheral arterial disease. He underwent rotational atherectomy by Dr. Gwenlyn Found in 2013 with stenting of the calcified right common iliac stenosis. He has mild nonobstructive coronary disease by cath in 2001 and a negative Myoview in 2011. He also has obstructive sleep apnea on CPAP has had reflux symptoms and atypical chest pain from time to time.   He did undergo an exercise tolerance test for which he exercised for 6 minutes and 7 metabolic equivalents. There was no evidence of ischemia. He did have PVCs and a fairly flat blood pressure response to exercise. There was marked dyspnea with exertion.   Today, he denies symptoms of  claudication, dizziness, presyncope, syncope, bleeding, or neurologic sequela. The patient is tolerating medications without difficulties. His amiodarone was recently stopped as his ophthalmologist saw that he was having evidence of corneal deposits. He has been having occasional chest pain. The chest discomfort woke him from sleep and lasted a few hours. It was not associated with exertion. He does say that he can exert himself without issues. He does not have chest pain or shortness of breath with exertion. He feels like this chest discomfort could be due to increased burden of PVCs.   Past Medical History:  Diagnosis Date  . Benign neoplasm of colon 10/24/2006  . BPH (benign prostatic hyperplasia)   . Daily headache   . Diverticulosis 09/03.2008  . Dysrhythmia    "irregular"  . Hypertension   . Internal hemorrhoids without mention of  complication [id number]  . Memory loss   . PAD (peripheral artery disease) ([place])   . Restless leg syndrome   . Sleep apnea    dx'd; wore mask; lost weight; turned in machine"   Past Surgical History:  Procedure Laterality Date  . APPENDECTOMY  ~ 1954  . ATHERECTOMY Right 11/07/2011   Procedure: ATHERECTOMY;  Surgeon: Lorretta Harp, MD;  Location: [facility] CATH LAB;  Service: Cardiovascular;  Laterality: Right;  . CARDIAC CATHETERIZATION  03/30/1999   non-critical CAD  . CATARACT EXTRACTION W/ INTRAOCULAR LENS  IMPLANT, BILATERAL  2011-2012  . ILIAC ARTERY STENT Right 11/07/2011   Diamondback orbital rotational atherectomy, PTA & stenting of calcified R CIA (Dr. Adora Fridge)  . LOWER EXTREMITY ANGIOGRAM N/A 11/07/2011   Procedure: LOWER EXTREMITY ANGIOGRAM;  Surgeon: Lorretta Harp, MD;  Location: [facility] CATH LAB;  Service: Cardiovascular;  Laterality: N/A;  . NM MYOCAR PERF WALL MOTION  09/2012   lexiscan myoview - low risk with fixed inferior defect with underlying bowel attenuation suggestive of artifact  . PERCUTANEOUS STENT INTERVENTION  11/07/2011   Procedure: PERCUTANEOUS STENT INTERVENTION;  Surgeon: Lorretta Harp, MD;  Location: [facility] CATH LAB;  Service: Cardiovascular;;  . SHOULDER ARTHROSCOPY W/ ROTATOR CUFF REPAIR Left 2010  . Sleep Study  01/09/2011   AHI during total sleep 14.4/hr and during REM 19.4/hr  . TRANSTHORACIC ECHOCARDIOGRAM  09/2012   EF 55-60%, LA mildly dilated     Current Outpatient Prescriptions  Medication Sig Dispense Refill  . aspirin 81 MG tablet  Take 81 mg by mouth daily.    Marland Kitchen ELIQUIS 5 MG TABS tablet TAKE 1 TABLET BY MOUTH TWICE DAILY 60 tablet 6  . EVENING PRIMROSE OIL PO Take 1,300 mg by mouth daily.    . furosemide (LASIX) 20 MG tablet Take 1 tablet (20 mg total) by mouth daily. 30 tablet 12  . losartan (COZAAR) 50 MG tablet TAKE 1 TABLET (50 MG TOTAL) BY MOUTH DAILY. 90 tablet 1  . pantoprazole (PROTONIX) 40 MG tablet TAKE ONE TABLET   BY MOUTH   DAILY 90  tablet 0  . pramipexole (MIRAPEX) 0.75 MG tablet Take 1 tablet by mouth daily.    . RESTASIS 0.05 % ophthalmic emulsion Apply 1 drop to eye 2 (two) times daily.    . Tamsulosin HCl (FLOMAX) 0.4 MG CAPS Take 0.4 mg by mouth daily after supper.    . zolpidem (AMBIEN CR) 6.25 MG CR tablet Take 12.5 mg by mouth at bedtime as needed for sleep.      No current facility-administered medications for this visit.     Allergies:   Bee venom   Social History:  The patient  reports that he quit smoking about 9 years ago. His smoking use included Cigarettes. He has a 55.00 pack-year smoking history. He has never used smokeless tobacco. He reports that he drinks about 8.4 oz of alcohol per week . He reports that he does not use drugs.   Family History:  The patient's family history includes Cancer in his maternal grandfather; Cancer (age of onset: 50) in his brother; Heart Problems (age of onset: 26) in his father; Heart failure in his brother.    ROS:  Please see the history of present illness.   Otherwise, review of systems is positive for Chest pain, leg swelling, palpitations, visual changes, dyspnea on exertion, constipation, back pain, balance problems, easy bruising.   All other systems are reviewed and negative.    PHYSICAL EXAM: VS:  BP 102/60   Pulse (!) 56   Ht 5\' 10"  (1.778 m)   Wt 188 lb 12.8 oz (85.6 kg)   BMI 27.09 kg/m  , BMI Body mass index is 27.09 kg/m. GEN: Well nourished, well developed, in no acute distress  HEENT: normal  Neck: no JVD, carotid bruits, or masses Cardiac: bradycardic; no murmurs, rubs, or gallops,no edema  Respiratory:  clear to auscultation bilaterally, normal work of breathing GI: soft, nontender, nondistended, + BS MS: no deformity or atrophy  Skin: warm and dry Neuro:  Strength and sensation are intact Psych: euthymic mood, full affect  EKG:  EKG is ordered today. Personal review of the ekg ordered today shows sinus rhythm 1 degree AV block, PVC, rate  47, LAFB  Recent Labs: 12/18/2014: Hemoglobin 13.2; Platelets 240 05/21/2015: TSH 2.70 11/09/2015: ALT 20; Brain Natriuretic Peptide 116.9; BUN 20; Creat 1.28; Potassium 4.9; Sodium 139    Lipid Panel     Component Value Date/Time   CHOL 189 04/11/2013 1058   TRIG 59 04/11/2013 1058   HDL 77 04/11/2013 1058   LDLCALC 100 (H) 04/11/2013 1058     Wt Readings from Last 3 Encounters:  12/16/15 188 lb 12.8 oz (85.6 kg)  11/09/15 187 lb (84.8 kg)  10/07/15 185 lb 6.4 oz (84.1 kg)      Other studies Reviewed: Additional studies/ records that were reviewed today include: holter  Review of the above records today demonstrates:  PAF, SVT, short runs of VT and ventricular bigeminy. 2.2 second pause,  lowest HR of 35.  32% PVCs  3/16 MPI:  No perfusion defects to suggest ischemia. EF 71%.  TTE 11/24/15 - Left ventricle: The cavity size was normal. Wall thickness was   normal. Systolic function was normal. The estimated ejection   fraction was in the range of 55% to 60%. Wall motion was normal;   there were no regional wall motion abnormalities. Left   ventricular diastolic function parameters were normal. - Left atrium: The atrium was mildly dilated.  Holter Minimum HR: 34 BPM at 8:30:44 AM Maximum HR: 109 BPM at 8:49:36 PM Average HR: 66 BPM Sinus rhythm 17.4% PVCs 1.7% APCs Occasional atrial and ventricular bigeminy  ASSESSMENT AND PLAN:  1.  Atrial fib/flutter:  The vent monitor showed significant amounts of atrial fibrillation and atrial flutter. The patient is currently on Eliquis and had a CHADS2VASc score of at least 5 indicating that he does need to continue his anticoagulation. Amiodarone was stopped at the request of his eye doctor. He is in sinus rhythm today. We'll continue his anticoagulation.  2. PVCs:  Most recent monitor showed PVC burden of 17%. He is having atypical chest pain, which could be due to his PVCs. He is unfortunately not tolerated amiodarone in the  past. We'll therefore plan to start him on mexiletine 150 mg twice a day to see if we can decrease his PVCs which may help with his chest pain. His ejection fraction has remained normal.  3. Chest pain: Appears atypical at this time. I told him that if he has more pain with exertion to call clinic back.   Current medicines are reviewed at length with the patient today.   The patient does not have concerns regarding his medicines.  The following changes were made today:  Mexiletine 150 mg twice daily  Labs/ tests ordered today include:  No orders of the defined types were placed in this encounter.    Disposition:   FU with Baily Serpe 6 months  Signed, Thandiwe Siragusa Meredith Leeds, MD  12/16/2015 10:47 AM     [facility] [address] [place] [address] [phone] (office) [phone] (fax)

## 2015-12-16 NOTE — Patient Instructions (Signed)
Medication Instructions:    Your physician has recommended you make the following change in your medication:   1) START Mexiletine 150 mg twice a day  --- If you need a refill on your cardiac medications before your next appointment, please call your pharmacy. ---  Labwork:  None ordered  Testing/Procedures:  None ordered  Follow-Up:  Your physician wants you to follow-up in: March 2018 with Dr. Curt Bears.  You will receive a reminder letter in the mail two months in advance. If you don't receive a letter, please call our office to schedule the follow-up appointment.   Thank you for choosing [facility]!!   Trinidad Curet, RN [phone]     Any Other Special Instructions Will Be Listed Below (If Applicable). Mexiletine capsules What is this medicine? MEXILETINE (mex [place] e teen) is an antiarrhythmic agent. This medicine is used to treat irregular heart rhythm and can slow rapid heartbeats. It can help your heart to return to and maintain a normal rhythm. Because of the side effects caused by this medicine, it is usually used for heartbeat problems that may be life-threatening. This medicine may be used for other purposes; ask your health care provider or pharmacist if you have questions. What should I tell my health care provider before I take this medicine? They need to know if you have any of these conditions: -liver disease -other heart problems -previous heart attack -an unusual or allergic reaction to mexiletine, other medicines, foods, dyes, or preservatives -pregnant or trying to get pregnant -breast-feeding How should I use this medicine? Take this medicine by mouth with a glass of water. Follow the directions on the prescription label. It is recommended that you take this medicine with food or an antacid. Take your doses at regular intervals. Do not take your medicine more often than directed. Do not stop taking except on the advice of your doctor or health care  professional. Talk to your pediatrician regarding the use of this medicine in children. Special care may be needed. Overdosage: If you think you have taken too much of this medicine contact a poison control center or emergency room at once. NOTE: This medicine is only for you. Do not share this medicine with others. What if I miss a dose? If you miss a dose, take it as soon as you can. If it is almost time for your next dose, take only that dose. Do not take double or extra doses. What may interact with this medicine? Do not take this medicine with any of the following medications: -dofetilide This medicine may also interact with the following medications: -caffeine -cimetidine -medicines for depression, anxiety, or psychotic disturbances -medicines to control heart rhythm -phenobarbital -phenytoin -rifampin -theophylline This list may not describe all possible interactions. Give your health care provider a list of all the medicines, herbs, non-prescription drugs, or dietary supplements you use. Also tell them if you smoke, drink alcohol, or use illegal drugs. Some items may interact with your medicine. What should I watch for while using this medicine? Your condition will be monitored closely when you first begin therapy. Often, this drug is first started in a hospital or other monitored health care setting. Once you are on maintenance therapy, visit your doctor or health care professional for regular checks on your progress. Because your condition and use of this medicine carry some risk, it is a good idea to carry an identification card, necklace or bracelet with details of your condition, medications, and doctor or health  care professional. Dennis Bast may get drowsy or dizzy. Do not drive, use machinery, or do anything that needs mental alertness until you know how this medicine affects you. Do not stand or sit up quickly, especially if you are an older patient. This reduces the risk of dizzy or  fainting spells. Alcohol can make you more dizzy, increase flushing and rapid heartbeats. Avoid alcoholic drinks. What side effects may I notice from receiving this medicine? Side effects that you should report to your doctor or health care professional as soon as possible: -allergic reactions like skin rash, itching or hives, swelling of the face, lips, or tongue -breathing problems -chest pain, continued irregular heartbeats -redness, blistering, peeling or loosening of the skin, including inside the mouth -seizures -skin rash -trembling, shaking -unusual bleeding or bruising -unusually weak or tired Side effects that usually do not require medical attention (report to your doctor or health care professional if they continue or are bothersome): -blurred vision -difficulty walking -heartburn -nausea, vomiting -nervousness -numbness, or tingling in the fingers or toes This list may not describe all possible side effects. Call your doctor for medical advice about side effects. You may report side effects to FDA at 1-800-FDA-1088. Where should I keep my medicine? Keep out of reach of children. Store at room temperature between 15 and 30 degrees C (59 and 86 degrees F). Throw away any unused medicine after the expiration date. NOTE: This sheet is a summary. It may not cover all possible information. If you have questions about this medicine, talk to your doctor, pharmacist, or health care provider.    2016, [employer]vier/[employer] Standard. (2007-08-26 13:59:49)

## 2016-01-11 ENCOUNTER — Ambulatory Visit: Payer: Medicare HMO | Admitting: Cardiology

## 2016-01-30 ENCOUNTER — Other Ambulatory Visit: Payer: Self-pay | Admitting: Internal Medicine

## 2016-02-04 ENCOUNTER — Encounter: Payer: Self-pay | Admitting: Internal Medicine

## 2016-02-04 ENCOUNTER — Ambulatory Visit (INDEPENDENT_AMBULATORY_CARE_PROVIDER_SITE_OTHER): Payer: Medicare HMO | Admitting: Internal Medicine

## 2016-02-04 VITALS — BP 130/82 | HR 64 | Ht 66.0 in | Wt 183.6 lb

## 2016-02-04 DIAGNOSIS — G4733 Obstructive sleep apnea (adult) (pediatric): Secondary | ICD-10-CM

## 2016-02-04 DIAGNOSIS — I739 Peripheral vascular disease, unspecified: Secondary | ICD-10-CM | POA: Diagnosis not present

## 2016-02-04 DIAGNOSIS — I4892 Unspecified atrial flutter: Secondary | ICD-10-CM | POA: Diagnosis not present

## 2016-02-04 DIAGNOSIS — Z9989 Dependence on other enabling machines and devices: Secondary | ICD-10-CM

## 2016-02-04 DIAGNOSIS — I493 Ventricular premature depolarization: Secondary | ICD-10-CM | POA: Diagnosis not present

## 2016-02-04 NOTE — Patient Instructions (Addendum)
Your physician wants you to follow-up in: 6 months with Dr. Hilty. You will receive a reminder letter in the mail two months in advance. If you don't receive a letter, please call our office to schedule the follow-up appointment.    

## 2016-02-04 NOTE — Progress Notes (Signed)
OFFICE NOTE  Chief Complaint:  Follow-up echo  Primary Care Physician: Phineas Inches, MD  HPI:  Carl Harper is a pleasant 81 year old male previously followed Dr. Rollene Fare with a history of peripheral arterial disease. He underwent diamondback orbital rotational atherectomy by Dr. Gwenlyn Found in 2013 with stenting of the calcified right common iliac stenosis. He has mild nonobstructive coronary disease by cath in 2001 and a negative Myoview in 2011. He also has obstructive sleep apnea on CPAP has had reflux symptoms and atypical chest pain from time to time.  His echocardiogram does show mild concentric LVH and borderline aortic root dilatation with a possible small ascending aortic aneurysm which will need to be followed. At his last visit with Dr. Rollene Fare his Toprol was cut down to 25 mg alternating with 12.5 mg every 2 some bradycardia.   Mr. Arola was recently seen by Dr. Percival Spanish in the office for shortness of breath and palpitations as well as exertional dyspnea. He wore the monitor however that was never performed as no monitors were available. He did undergo an exercise tolerance test for which he exercised for 6 minutes and 7 metabolic equivalents. There was no evidence of ischemia. He did have PVCs and a fairly flat blood pressure response to exercise. There was marked dyspnea with exertion. He also is reported recent tremors, some memory loss and instability with his gait.  Mr. Laughlin returns today and is complaining of a sinking spell his chest. When pressed further he felt like he was possibly presyncopal occasionally has trouble getting his breath. He has some occasional dizziness. He was evaluated for monitor however none was placed because there was not a monitor available. He did have his metoprolol increased to 12.5/25 mg every other day. He seems to think that this is helped his symptoms and feels better than he had when he saw Dr. Percival Spanish.  I saw Honest back in the  office today. He says that he call and make an appointment about a month ago because his been feeling some episodes of chest discomfort in the morning. He's also felt somewhat short of breath. An EKG in the office today demonstrates new onset atrial flutter with ventricular bigeminy at a rate of 77. This is a new diagnosis for him. I spent greater than 10 minutes discussing atrial fibrillation and anticoagulation options with him. He understands his options and the need for trying to establish a sinus rhythm.  Mr. Mehle returns today the office for follow-up. His EKG today fortunately shows sinus rhythm with PVCs. This is good news as we will not need to schedule him for a cardioversion. He has been taking Eliquis and feels that this is tolerable. He denies any bleeding problems. Here she does feel improvement in energy and I suspect this is from converting back to sinus rhythm at some point. Based on his TIA event, I suspect it may been related to paroxysmal atrial flutter. He did have carotid Dopplers which show very mild bilateral carotid disease and are not likely the source of his TIA event.  I saw Mr. Freer back today in the office. He is maintaining sinus bradycardia with first degree AV block at a rate of 52. He denies any bleeding problems on Eliquis. He is on low-dose amiodarone. This was started by Dr. Curt Bears on 12/30/14, after monitor was placed which showed a very high burden of PVCs greater than 30% as well as A. fib. A repeat echo was performed which showed preserved LV  systolic function. Mr. Hancox was symptomatic with his PVCs. He reports a marked reduction in his symptoms after starting the amiodarone. I do not see these had baseline pulmonary function testing, thyroid or liver function tests.  11/09/2015  Mr. Trafton returns today for follow-up. He has had recent worsening shortness of breath and significant fatigue and leg weakness with exercise. This is of fairly recent finding.  He was seen by Dr. Curt Bears, who noted that recently Mr. 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 Since that time, he has been having a washout of the amiodarone with the plan to reassess his burden of PVCs by monitoring at the end of September. This is a ready been scheduled. His last echo was in 2016 and showed normal LV function, but he has had new symptoms as described above and would likely benefit from a repeat echo to rule out cardiomyopathy.  02/04/2016  Mr. Ketner returns today for follow-up. He has been seen by Dr. Curt Bears for evaluation of his a-fib. He was on amiodarone, but we discontinued it due to corneal deposits. He has maintained sinus rhythm without recurrent atrial fibrillation. PVCs were noted and noted to be significant and he was started on mexiletine. Since that he's had some improvement in his symptoms although was noted to have some PVCs today. He has a follow-up with Dr. Curt Bears in a few months. We repeated an echocardiogram which showed normal systolic and diastolic function with mild left atrial enlargement.  PMHx:  Past Medical History:  Diagnosis Date  . Benign neoplasm of colon 10/24/2006  . BPH (benign prostatic hyperplasia)   . Daily headache   . Diverticulosis 09/03.2008  . Dysrhythmia    "irregular"  . Hypertension   . Internal hemorrhoids without mention of complication [id number]  . Memory loss   . PAD (peripheral artery disease) ([place])   . Restless leg syndrome   . Sleep apnea    dx'd; wore mask; lost weight; turned in machine"    Past Surgical History:  Procedure Laterality Date  . APPENDECTOMY  ~ 1954  . ATHERECTOMY Right 11/07/2011   Procedure: ATHERECTOMY;  Surgeon: Lorretta Harp, MD;  Location: [facility] CATH LAB;  Service: Cardiovascular;  Laterality: Right;  . CARDIAC CATHETERIZATION  03/30/1999   non-critical CAD  . CATARACT EXTRACTION W/ INTRAOCULAR LENS  IMPLANT, BILATERAL  2011-2012  . ILIAC  ARTERY STENT Right 11/07/2011   Diamondback orbital rotational atherectomy, PTA & stenting of calcified R CIA (Dr. Adora Fridge)  . LOWER EXTREMITY ANGIOGRAM N/A 11/07/2011   Procedure: LOWER EXTREMITY ANGIOGRAM;  Surgeon: Lorretta Harp, MD;  Location: [facility] CATH LAB;  Service: Cardiovascular;  Laterality: N/A;  . NM MYOCAR PERF WALL MOTION  09/2012   lexiscan myoview - low risk with fixed inferior defect with underlying bowel attenuation suggestive of artifact  . PERCUTANEOUS STENT INTERVENTION  11/07/2011   Procedure: PERCUTANEOUS STENT INTERVENTION;  Surgeon: Lorretta Harp, MD;  Location: [facility] CATH LAB;  Service: Cardiovascular;;  . SHOULDER ARTHROSCOPY W/ ROTATOR CUFF REPAIR Left 2010  . Sleep Study  01/09/2011   AHI during total sleep 14.4/hr and during REM 19.4/hr  . TRANSTHORACIC ECHOCARDIOGRAM  09/2012   EF 55-60%, LA mildly dilated    FAMHx:  Family History  Problem Relation Age of Onset  . Heart Problems Father 39  . Cancer Brother 64  . Heart failure Brother   . Cancer Maternal Grandfather     prostate    SOCHx:   reports  that he quit smoking about 9 years ago. His smoking use included Cigarettes. He has a 55.00 pack-year smoking history. He has never used smokeless tobacco. He reports that he drinks about 8.4 oz of alcohol per week . He reports that he does not use drugs.  ALLERGIES:  Allergies  Allergen Reactions  . Bee Venom Other (See Comments)    unknown    ROS: Pertinent items noted in HPI and remainder of comprehensive ROS otherwise negative.  HOME MEDS: Current Outpatient Prescriptions  Medication Sig Dispense Refill  . aspirin 81 MG tablet Take 81 mg by mouth daily.    Marland Kitchen ELIQUIS 5 MG TABS tablet TAKE 1 TABLET BY MOUTH TWICE DAILY 60 tablet 5  . EVENING PRIMROSE OIL PO Take 1,300 mg by mouth daily.    . furosemide (LASIX) 20 MG tablet Take 1 tablet (20 mg total) by mouth daily. 30 tablet 12  . losartan (COZAAR) 25 MG tablet Take 25 mg by mouth daily.  3  .  metoprolol succinate (TOPROL-XL) 25 MG 24 hr tablet Take 25 mg by mouth daily.  3  . mexiletine (MEXITIL) 150 MG capsule Take 1 capsule (150 mg total) by mouth 2 (two) times daily. 60 capsule 6  . oxybutynin (DITROPAN XL) 15 MG 24 hr tablet Take 15 mg by mouth at bedtime.    . pantoprazole (PROTONIX) 40 MG tablet TAKE ONE TABLET   BY MOUTH   DAILY 90 tablet 0  . RESTASIS 0.05 % ophthalmic emulsion Apply 1 drop to eye 2 (two) times daily.    . rivastigmine (EXELON) 4.6 mg/24hr Place 1 patch onto the skin daily.    . Tamsulosin HCl (FLOMAX) 0.4 MG CAPS Take 0.4 mg by mouth daily after supper.    . traZODone (DESYREL) 100 MG tablet Take 100 mg by mouth at bedtime.  3   No current facility-administered medications for this visit.     LABS/IMAGING: No results found for this or any previous visit (from the past 48 hour(s)). No results found.  VITALS: BP 130/82   Pulse 64   Ht 5\' 6"  (1.676 m)   Wt 183 lb 9.6 oz (83.3 kg)   BMI 29.63 kg/m   EXAM: General appearance: alert and no distress Neck: no carotid bruit and no JVD Lungs: clear to auscultation bilaterally Heart: regular rate and rhythm, S1, S2 normal, no murmur, click, rub or gallop Abdomen: soft, non-tender; bowel sounds normal; no masses,  no organomegaly Extremities: extremities normal, atraumatic, no cyanosis or edema Pulses: 2+ and symmetric Skin: Skin color, texture, turgor normal. No rashes or lesions Neurologic: Mental status: Alert, oriented, thought content appropriate Motor: Strength 4/5 hip extensors and leg extensors Psych: Pleasant  EKG: Sinus rhythm with first degree AV block and PVCs at 65, left anterior fascicular block  ASSESSMENT: 1. 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 PVC's -suppressed with mexilitine 3. PAD status post diamondback were orbital atherectomy and stenting of the right common iliac  artery 4. Hypertension 5. Dyslipidemia 6. Mild CAD 7. Obstructive sleep apnea on CPAP 8. DOE - negative treadmill stress test 9. Tremor/memory loss/gait abnormalities - no evidence of Parkinson's by recent neurology consult  PLAN: 1.   Mr. Hinton reports he is feeling somewhat better. His symptoms may been related to frequent PVCs. He is now on mexiletine and continues to have some PVCs however the burden may be lower. Monitoring would probably be  helpful and I'll defer this to Dr. Curt Bears. He appears to be in sinus rhythm today. He continues on Eliquis for anticoagulation. Amiodarone was listed as an intolerance due to corneal deposits. He denies any PAD symptoms. His weakness has improved somewhat.   Follow-up in 6 months.  Pixie Casino, MD, [facility] Attending Cardiologist [place] 02/04/2016, 3:35 PM

## 2016-06-21 ENCOUNTER — Encounter: Payer: Self-pay | Admitting: Cardiology

## 2016-06-21 ENCOUNTER — Ambulatory Visit (INDEPENDENT_AMBULATORY_CARE_PROVIDER_SITE_OTHER): Payer: Medicare HMO | Admitting: Cardiology

## 2016-06-21 VITALS — BP 124/66 | HR 48 | Ht 66.0 in | Wt 174.8 lb

## 2016-06-21 DIAGNOSIS — I493 Ventricular premature depolarization: Secondary | ICD-10-CM

## 2016-06-21 DIAGNOSIS — I480 Paroxysmal atrial fibrillation: Secondary | ICD-10-CM | POA: Diagnosis not present

## 2016-06-21 NOTE — Patient Instructions (Signed)
Medication Instructions:    Your physician recommends that you continue on your current medications as directed. Please refer to the Current Medication list given to you today.  Labwork:  None ordered  Testing/Procedures:  None ordered  Follow-Up:  Your physician wants you to follow-up in: 6 months with Dr. Camnitz.  You will receive a reminder letter in the mail two months in advance. If you don't receive a letter, please call our office to schedule the follow-up appointment.  - If you need a refill on your cardiac medications before your next appointment, please call your pharmacy.    Thank you for choosing [facility]!!   Simren Popson, RN [phone]         

## 2016-06-21 NOTE — Progress Notes (Signed)
Electrophysiology Office Note   Date:  06/21/2016   ID:  Carl Harper, DOB [birth year], MRN [id number]  PCP:  Phineas Inches, MD  Cardiologist:  Lyman Bishop Primary Electrophysiologist:  Trinita Devlin Meredith Leeds, MD    Chief Complaint  Patient presents with  . Palpitations     History of Present Illness: Carl Harper is a 82 y.o. male who presents today for electrophysiology evaluation.   He has a history of peripheral arterial disease. He underwent rotational atherectomy by Dr. Gwenlyn Found in 2013 with stenting of the calcified right common iliac stenosis. He has mild nonobstructive coronary disease by cath in 2001 and a negative Myoview in 2011. He also has obstructive sleep apnea on CPAP has had reflux symptoms and atypical chest pain from time to time.   He did undergo an exercise tolerance test for which he exercised for 6 minutes and 7 metabolic equivalents. There was no evidence of ischemia. He did have PVCs and a fairly flat blood pressure response to exercise. There was marked dyspnea with exertion.   Currently he feels well without major complaint. He has no chest pain, shortness of breath, PND, orthopnea, palpitations, syncope, or near syncope. He is felt well since I last saw him 6 months ago. His chest pain, that he was having at that point, has gone away. His EKG today shows no evidence of PVCs. He is able to do all of his daily activities without limitation.   Past Medical History:  Diagnosis Date  . Benign neoplasm of colon 10/24/2006  . BPH (benign prostatic hyperplasia)   . Daily headache   . Diverticulosis 09/03.2008  . Dysrhythmia    "irregular"  . Hypertension   . Internal hemorrhoids without mention of complication 33/82/5053  . Memory loss   . PAD (peripheral artery disease) ([place])   . Restless leg syndrome   . Sleep apnea    dx'd; wore mask; lost weight; turned in machine"   Past Surgical History:  Procedure Laterality Date  . APPENDECTOMY  ~ 1954  .  ATHERECTOMY Right 11/07/2011   Procedure: ATHERECTOMY;  Surgeon: Lorretta Harp, MD;  Location: [facility] CATH LAB;  Service: Cardiovascular;  Laterality: Right;  . CARDIAC CATHETERIZATION  03/30/1999   non-critical CAD  . CATARACT EXTRACTION W/ INTRAOCULAR LENS  IMPLANT, BILATERAL  2011-2012  . ILIAC ARTERY STENT Right 11/07/2011   Diamondback orbital rotational atherectomy, PTA & stenting of calcified R CIA (Dr. Adora Fridge)  . LOWER EXTREMITY ANGIOGRAM N/A 11/07/2011   Procedure: LOWER EXTREMITY ANGIOGRAM;  Surgeon: Lorretta Harp, MD;  Location: [facility] CATH LAB;  Service: Cardiovascular;  Laterality: N/A;  . NM MYOCAR PERF WALL MOTION  09/2012   lexiscan myoview - low risk with fixed inferior defect with underlying bowel attenuation suggestive of artifact  . PERCUTANEOUS STENT INTERVENTION  11/07/2011   Procedure: PERCUTANEOUS STENT INTERVENTION;  Surgeon: Lorretta Harp, MD;  Location: [facility] CATH LAB;  Service: Cardiovascular;;  . SHOULDER ARTHROSCOPY W/ ROTATOR CUFF REPAIR Left 2010  . Sleep Study  01/09/2011   AHI during total sleep 14.4/hr and during REM 19.4/hr  . TRANSTHORACIC ECHOCARDIOGRAM  09/2012   EF 55-60%, LA mildly dilated     Current Outpatient Prescriptions  Medication Sig Dispense Refill  . alendronate (FOSAMAX) 70 MG tablet Take 70 mg by mouth.    Marland Kitchen aspirin 81 MG tablet Take 81 mg by mouth daily.    Marland Kitchen ELIQUIS 5 MG TABS tablet TAKE 1 TABLET BY MOUTH TWICE DAILY  60 tablet 5  . EVENING PRIMROSE OIL PO Take 1,300 mg by mouth daily.    Marland Kitchen losartan (COZAAR) 25 MG tablet Take 25 mg by mouth daily.  3  . metoprolol succinate (TOPROL-XL) 25 MG 24 hr tablet Take 25 mg by mouth daily.  3  . mexiletine (MEXITIL) 150 MG capsule Take 1 capsule (150 mg total) by mouth 2 (two) times daily. 60 capsule 6  . oxybutynin (DITROPAN XL) 15 MG 24 hr tablet Take 15 mg by mouth at bedtime.    . pantoprazole (PROTONIX) 40 MG tablet TAKE ONE TABLET   BY MOUTH   DAILY 90 tablet 0  . RESTASIS 0.05 % ophthalmic  emulsion Apply 1 drop to eye 2 (two) times daily.    . rivastigmine (EXELON) 4.6 mg/24hr Place 1 patch onto the skin daily.    . Tamsulosin HCl (FLOMAX) 0.4 MG CAPS Take 0.4 mg by mouth daily after supper.    . traZODone (DESYREL) 100 MG tablet Take 100 mg by mouth at bedtime.  3  . furosemide (LASIX) 20 MG tablet Take 1 tablet (20 mg total) by mouth daily. 30 tablet 12   No current facility-administered medications for this visit.     Allergies:   Amiodarone and Bee venom   Social History:  The patient  reports that he quit smoking about 10 years ago. His smoking use included Cigarettes. He has a 55.00 pack-year smoking history. He has never used smokeless tobacco. He reports that he drinks about 8.4 oz of alcohol per week . He reports that he does not use drugs.   Family History:  The patient's family history includes Cancer in his maternal grandfather; Cancer (age of onset: 28) in his brother; Heart Problems (age of onset: 78) in his father; Heart failure in his brother.    ROS:  Please see the history of present illness.   Otherwise, review of systems is positive for none.   All other systems are reviewed and negative.     PHYSICAL EXAM: VS:  BP 124/66   Pulse (!) 48   Ht 5\' 6"  (1.676 m)   Wt 174 lb 12.8 oz (79.3 kg)   BMI 28.21 kg/m  , BMI Body mass index is 28.21 kg/m. GEN: Well nourished, well developed, in no acute distress  HEENT: normal  Neck: no JVD, carotid bruits, or masses Cardiac: RRR; no murmurs, rubs, or gallops,no edema  Respiratory:  clear to auscultation bilaterally, normal work of breathing GI: soft, nontender, nondistended, + BS MS: no deformity or atrophy  Skin: warm and dry Neuro:  Strength and sensation are intact Psych: euthymic mood, full affect  EKG:  EKG is ordered today. Personal review of the ekg ordered shows sinus rhythm, LAFB, 1 degree AV block   Recent Labs: 11/09/2015: ALT 20; Brain Natriuretic Peptide 116.9; BUN 20; Creat 1.28; Potassium  4.9; Sodium 139    Lipid Panel     Component Value Date/Time   CHOL 189 04/11/2013 1058   TRIG 59 04/11/2013 1058   HDL 77 04/11/2013 1058   LDLCALC 100 (H) 04/11/2013 1058     Wt Readings from Last 3 Encounters:  06/21/16 174 lb 12.8 oz (79.3 kg)  02/04/16 183 lb 9.6 oz (83.3 kg)  12/16/15 188 lb 12.8 oz (85.6 kg)      Other studies Reviewed: Additional studies/ records that were reviewed today include: holter personally reviewed Review of the above records today demonstrates:  PAF, SVT, short runs of VT and  ventricular bigeminy. 2.2 second pause, lowest HR of 35.  32% PVCs  3/16 MPI:  No perfusion defects to suggest ischemia. EF 71%.  TTE 11/24/15 - Left ventricle: The cavity size was normal. Wall thickness was   normal. Systolic function was normal. The estimated ejection   fraction was in the range of 55% to 60%. Wall motion was normal;   there were no regional wall motion abnormalities. Left   ventricular diastolic function parameters were normal. - Left atrium: The atrium was mildly dilated.  Holter Minimum HR: 34 BPM at 8:30:44 AM Maximum HR: 109 BPM at 8:49:36 PM Average HR: 66 BPM Sinus rhythm 17.4% PVCs 1.7% APCs Occasional atrial and ventricular bigeminy  ASSESSMENT AND PLAN:  1.  Atrial fib/flutter:  On Eliquis. Amiodarone stopped due to corneal deposits.  Remaining in sinus rhythm today. No changes necessary.  This patients CHA2DS2-VASc Score and unadjusted Ischemic Stroke Rate (% per year) is equal to 7.2 % stroke rate/year from a score of 5  Above score calculated as 1 point each if present [CHF, HTN, DM, Vascular=MI/PAD/Aortic Plaque, Age if 65-74, or Male] Above score calculated as 2 points each if present [Age > 75, or Stroke/TIA/TE]  2. PVCs:  The monitor showed increasing PVC burden up to 70%. Was put on mexiletine. No PVCs noted on his EKG today. He is feeling well without major complaint. Continue current management.  3. Coronary artery  disease: No current chest pain. Continue current management.  Current medicines are reviewed at length with the patient today.   The patient does not have concerns regarding his medicines.  The following changes were made today:  none  Labs/ tests ordered today include:  Orders Placed This Encounter  Procedures  . EKG 12-Lead     Disposition:   FU with Aminata Buffalo 6  months  Signed, Vivianna Piccini Meredith Leeds, MD  06/21/2016 11:46 AM     [facility] [address] [place] [address] [phone] (office) [phone] (fax)

## 2016-07-24 ENCOUNTER — Other Ambulatory Visit: Payer: Self-pay | Admitting: Cardiovascular Disease

## 2016-07-24 NOTE — Telephone Encounter (Signed)
Rx(s) sent to pharmacy electronically.  

## 2016-07-26 ENCOUNTER — Other Ambulatory Visit: Payer: Self-pay | Admitting: Cardiology

## 2016-08-08 ENCOUNTER — Ambulatory Visit (INDEPENDENT_AMBULATORY_CARE_PROVIDER_SITE_OTHER): Payer: Medicare HMO | Admitting: Internal Medicine

## 2016-08-08 ENCOUNTER — Encounter: Payer: Self-pay | Admitting: Internal Medicine

## 2016-08-08 VITALS — BP 102/52 | HR 53 | Ht 66.0 in | Wt 176.0 lb

## 2016-08-08 DIAGNOSIS — I480 Paroxysmal atrial fibrillation: Secondary | ICD-10-CM | POA: Diagnosis not present

## 2016-08-08 DIAGNOSIS — I493 Ventricular premature depolarization: Secondary | ICD-10-CM

## 2016-08-08 DIAGNOSIS — R002 Palpitations: Secondary | ICD-10-CM

## 2016-08-08 NOTE — Patient Instructions (Addendum)
Medication Instructions:  Ok to take extra 0.5-1 tablet of metoprolol (Toprol-XL) for recurrent episodes of palpitations  Follow-Up: Your physician wants you to follow-up in: 6 MONTHS with Dr. Debara Pickett.  You will receive a reminder letter in the mail two months in advance. If you don't receive a letter, please call our office to schedule the follow-up appointment.   Any Other Special Instructions Will Be Listed Below (If Applicable).     If you need a refill on your cardiac medications before your next appointment, please call your pharmacy.

## 2016-08-09 ENCOUNTER — Other Ambulatory Visit: Payer: Self-pay | Admitting: Cardiology

## 2016-08-10 DIAGNOSIS — I480 Paroxysmal atrial fibrillation: Secondary | ICD-10-CM | POA: Insufficient documentation

## 2016-08-10 DIAGNOSIS — R002 Palpitations: Secondary | ICD-10-CM | POA: Insufficient documentation

## 2016-08-10 NOTE — Progress Notes (Signed)
Grossly normal   OFFICE NOTE  Chief Complaint:  Palpitations have improved  Primary Care Physician: Bernerd Limbo, MD  HPI:  Carl Harper is a pleasant 82 year old male previously followed Dr. Rollene Fare with a history of peripheral arterial disease. He underwent diamondback orbital rotational atherectomy by Dr. Gwenlyn Found in 2013 with stenting of the calcified right common iliac stenosis. He has mild nonobstructive coronary disease by cath in 2001 and a negative Myoview in 2011. He also has obstructive sleep apnea on CPAP has had reflux symptoms and atypical chest pain from time to time.  His echocardiogram does show mild concentric LVH and borderline aortic root dilatation with a possible small ascending aortic aneurysm which will need to be followed. At his last visit with Dr. Rollene Fare his Toprol was cut down to 25 mg alternating with 12.5 mg every 2 some bradycardia.   Carl Harper was recently seen by Dr. Percival Spanish in the office for shortness of breath and palpitations as well as exertional dyspnea. He wore the monitor however that was never performed as no monitors were available. He did undergo an exercise tolerance test for which he exercised for 6 minutes and 7 metabolic equivalents. There was no evidence of ischemia. He did have PVCs and a fairly flat blood pressure response to exercise. There was marked dyspnea with exertion. He also is reported recent tremors, some memory loss and instability with his gait.  Carl Harper returns today and is complaining of a sinking spell his chest. When pressed further he felt like he was possibly presyncopal occasionally has trouble getting his breath. He has some occasional dizziness. He was evaluated for monitor however none was placed because there was not a monitor available. He did have his metoprolol increased to 12.5/25 mg every other day. He seems to think that this is helped his symptoms and feels better than he had when he saw Dr. Percival Spanish.  I  saw Carl Harper back in the office today. He says that he call and make an appointment about a month ago because his been feeling some episodes of chest discomfort in the morning. He's also felt somewhat short of breath. An EKG in the office today demonstrates new onset atrial flutter with ventricular bigeminy at a rate of 77. This is a new diagnosis for him. I spent greater than 10 minutes discussing atrial fibrillation and anticoagulation options with him. He understands his options and the need for trying to establish a sinus rhythm.  Carl Harper returns today the office for follow-up. His EKG today fortunately shows sinus rhythm with PVCs. This is good news as we will not need to schedule him for a cardioversion. He has been taking Eliquis and feels that this is tolerable. He denies any bleeding problems. Here she does feel improvement in energy and I suspect this is from converting back to sinus rhythm at some point. Based on his TIA event, I suspect it may been related to paroxysmal atrial flutter. He did have carotid Dopplers which show very mild bilateral carotid disease and are not likely the source of his TIA event.  I saw Carl Harper back today in the office. He is maintaining sinus bradycardia with first degree AV block at a rate of 52. He denies any bleeding problems on Eliquis. He is on low-dose amiodarone. This was started by Dr. Curt Bears on 12/30/14, after monitor was placed which showed a very high burden of PVCs greater than 30% as well as A. fib. A repeat echo was performed which  showed preserved LV systolic function. Carl Harper was symptomatic with his PVCs. He reports a marked reduction in his symptoms after starting the amiodarone. I do not see these had baseline pulmonary function testing, thyroid or liver function tests.  11/09/2015  Carl Harper returns today for follow-up. He has had recent worsening shortness of breath and significant fatigue and leg weakness with exercise. This is of  fairly recent finding. He was seen by Dr. Curt Bears, who noted that recently Carl Harper ophthalmologist had recommended discontinuing amiodarone due to corneal deposits. Since that time, he has been having a washout of the amiodarone with the plan to reassess his burden of PVCs by monitoring at the end of September. This is a ready been scheduled. His last echo was in 2016 and showed normal LV function, but he has had new symptoms as described above and would likely benefit from a repeat echo to rule out cardiomyopathy.  02/04/2016  Carl Harper returns today for follow-up. He has been seen by Dr. Curt Bears for evaluation of his a-fib. He was on amiodarone, but we discontinued it due to corneal deposits. He has maintained sinus rhythm without recurrent atrial fibrillation. PVCs were noted and noted to be significant and he was started on mexiletine. Since that he's had some improvement in his symptoms although was noted to have some PVCs today. He has a follow-up with Dr. Curt Bears in a few months. We repeated an echocardiogram which showed normal systolic and diastolic function with mild left atrial enlargement.  08/10/2016  Carl Harper returns today. He reports his palpitations have improved significantly with the addition of mexiletine. He is seen Dr. Curt Bears with cardiac electrophysiology for this. Blood pressure is well-controlled today 102/52. EKG which is personally reviewed shows sinus bradycardia with marked sinus arrhythmia and first-degree AV block at 53. QTC is 377 ms with a left anterior fascicular block and first-degree AV block.   PMHx:  Past Medical History:  Diagnosis Date  . Benign neoplasm of colon 10/24/2006  . BPH (benign prostatic hyperplasia)   . Daily headache   . Diverticulosis 09/03.2008  . Dysrhythmia    "irregular"  . Hypertension   . Internal hemorrhoids without mention of complication 50/53/9767  . Memory loss   . PAD (peripheral artery disease) ([place])   . Restless leg  syndrome   . Sleep apnea    dx'd; wore mask; lost weight; turned in machine"    Past Surgical History:  Procedure Laterality Date  . APPENDECTOMY  ~ 1954  . ATHERECTOMY Right 11/07/2011   Procedure: ATHERECTOMY;  Surgeon: Lorretta Harp, MD;  Location: [facility] CATH LAB;  Service: Cardiovascular;  Laterality: Right;  . CARDIAC CATHETERIZATION  03/30/1999   non-critical CAD  . CATARACT EXTRACTION W/ INTRAOCULAR LENS  IMPLANT, BILATERAL  2011-2012  . ILIAC ARTERY STENT Right 11/07/2011   Diamondback orbital rotational atherectomy, PTA & stenting of calcified R CIA (Dr. Adora Fridge)  . LOWER EXTREMITY ANGIOGRAM N/A 11/07/2011   Procedure: LOWER EXTREMITY ANGIOGRAM;  Surgeon: Lorretta Harp, MD;  Location: [facility] CATH LAB;  Service: Cardiovascular;  Laterality: N/A;  . NM MYOCAR PERF WALL MOTION  09/2012   lexiscan myoview - low risk with fixed inferior defect with underlying bowel attenuation suggestive of artifact  . PERCUTANEOUS STENT INTERVENTION  11/07/2011   Procedure: PERCUTANEOUS STENT INTERVENTION;  Surgeon: Lorretta Harp, MD;  Location: [facility] CATH LAB;  Service: Cardiovascular;;  . SHOULDER ARTHROSCOPY W/ ROTATOR CUFF REPAIR Left 2010  . Sleep Study  01/09/2011  AHI during total sleep 14.4/hr and during REM 19.4/hr  . TRANSTHORACIC ECHOCARDIOGRAM  09/2012   EF 55-60%, LA mildly dilated    FAMHx:  Family History  Problem Relation Age of Onset  . Heart Problems Father 20  . Cancer Brother 59  . Heart failure Brother   . Cancer Maternal Grandfather        prostate    SOCHx:   reports that he quit smoking about 10 years ago. His smoking use included Cigarettes. He has a 55.00 pack-year smoking history. He has never used smokeless tobacco. He reports that he drinks about 8.4 oz of alcohol per week . He reports that he does not use drugs.  ALLERGIES:  Allergies  Allergen Reactions  . Amiodarone Other (See Comments)    Other reaction(s): Other (See Comments) Corneal deposits Corneal  deposits  . Bee Venom Other (See Comments)    unknown    ROS: Pertinent items noted in HPI and remainder of comprehensive ROS otherwise negative.  HOME MEDS: Current Outpatient Prescriptions  Medication Sig Dispense Refill  . alendronate (FOSAMAX) 70 MG tablet Take 70 mg by mouth once a week.     Marland Kitchen aspirin 81 MG tablet Take 81 mg by mouth daily.    Marland Kitchen EVENING PRIMROSE OIL PO Take 1,300 mg by mouth daily.    Marland Kitchen losartan (COZAAR) 25 MG tablet Take 25 mg by mouth daily.  3  . metoprolol succinate (TOPROL-XL) 25 MG 24 hr tablet TAKE ONE TABLET BY MOUTH DAILY 90 tablet 3  . mexiletine (MEXITIL) 150 MG capsule TAKE ONE CAPSULE BY MOUTH TWICE A DAY 60 capsule 10  . oxybutynin (DITROPAN XL) 15 MG 24 hr tablet Take 15 mg by mouth at bedtime.    . pantoprazole (PROTONIX) 40 MG tablet TAKE ONE TABLET   BY MOUTH   DAILY 90 tablet 0  . RESTASIS 0.05 % ophthalmic emulsion Apply 1 drop to eye 2 (two) times daily.    . Tamsulosin HCl (FLOMAX) 0.4 MG CAPS Take 0.4 mg by mouth daily after supper.    . traZODone (DESYREL) 100 MG tablet Take 100 mg by mouth at bedtime.  3  . ELIQUIS 5 MG TABS tablet TAKE ONE TABLET BY MOUTH TWICE A DAY 60 tablet 5  . furosemide (LASIX) 20 MG tablet Take 1 tablet (20 mg total) by mouth daily. 30 tablet 12   No current facility-administered medications for this visit.     LABS/IMAGING: No results found for this or any previous visit (from the past 48 hour(s)). No results found.  VITALS: BP (!) 102/52   Pulse (!) 53   Ht 5\' 6"  (1.676 m)   Wt 176 lb (79.8 kg)   BMI 28.41 kg/m   EXAM: General appearance: alert and no distress Neck: no carotid bruit and no JVD Lungs: clear to auscultation bilaterally Heart: regular rate and rhythm, S1, S2 normal, no murmur, click, rub or gallop Abdomen: soft, non-tender; bowel sounds normal; no masses,  no organomegaly Extremities: extremities normal, atraumatic, no cyanosis or edema Pulses: 2+ and symmetric Skin: Skin color,  texture, turgor normal. No rashes or lesions Neurologic: GroPsych: Pleasanty normal Psych: Pleasant  EKG: Sinus bradycardia with marked sinus arrhythmia and first-degree AV block at 53, LAFB, QTc 377 ms  ASSESSMENT: 1. 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 PVC's -suppressed with mexilitine 3. PAD status post diamondback were orbital atherectomy and stenting of  the right common iliac artery 4. Hypertension 5. Dyslipidemia 6. Mild CAD 7. Obstructive sleep apnea on CPAP 8. DOE - negative treadmill stress test 9. Tremor/memory loss/gait abnormalities - no evidence of Parkinson's by recent neurology consult  PLAN: 1.   Mr. Verville has had improvement in his symptoms on mexiletine but reports some occasional paroxysmal arrhythmias. These episodes could be recurrent flutter. Overall is feeling well though. I instructed him that he can take an extra half to 1 tablet of metoprolol for recurrent palpitations. He is adequately anticoagulated on Eliquis. Plan follow-up with me in 6 months or sooner as necessary.  Pixie Casino, MD, [facility] Attending Cardiologist [facility] 08/10/2016, 3:07 PM

## 2016-11-03 DIAGNOSIS — M545 Low back pain, unspecified: Secondary | ICD-10-CM | POA: Diagnosis present

## 2016-12-12 NOTE — Progress Notes (Signed)
Electrophysiology Office Note   Date:  12/12/2016   ID:  Carl Harper, DOB 04/04/1934, MRN [id number]  PCP:  Bernerd Limbo, MD  Cardiologist:  Lyman Bishop Primary Electrophysiologist:  Constance Haw, MD    No chief complaint on file.    History of Present Illness: Carl Harper is a 82 y.o. male who presents today for electrophysiology evaluation.   He has a history of peripheral arterial disease. He underwent rotational atherectomy by Dr. Gwenlyn Found in 2013 with stenting of the calcified right common iliac stenosis. He has mild nonobstructive coronary disease by cath in 2001 and a negative Myoview in 2011. He also has obstructive sleep apnea on CPAP has had reflux symptoms and atypical chest pain from time to time.   He did undergo an exercise tolerance test for which he exercised for 6 minutes and 7 metabolic equivalents. There was no evidence of ischemia. He did have PVCs and a fairly flat blood pressure response to exercise. There was marked dyspnea with exertion.   Today, denies symptoms of palpitations, chest pain, shortness of breath, orthopnea, PND, lower extremity edema, claudication, dizziness, presyncope, syncope, bleeding, or neurologic sequela. The patient is tolerating medications without difficulties. He has not had any further episodes of atrial fibrillation. He is also not noted any further palpitations that he can attribute to     Past Medical History:  Diagnosis Date  . Benign neoplasm of colon 10/24/2006  . BPH (benign prostatic hyperplasia)   . Daily headache   . Diverticulosis 09/03.2008  . Dysrhythmia    "irregular"  . Hypertension   . Internal hemorrhoids without mention of complication 85/88/5027  . Memory loss   . PAD (peripheral artery disease) ([place])   . Restless leg syndrome   . Sleep apnea    dx'd; wore mask; lost weight; turned in machine"   Past Surgical History:  Procedure Laterality Date  . APPENDECTOMY  ~ 1954  . ATHERECTOMY  Right 11/07/2011   Procedure: ATHERECTOMY;  Surgeon: Lorretta Harp, MD;  Location: [facility] CATH LAB;  Service: Cardiovascular;  Laterality: Right;  . CARDIAC CATHETERIZATION  03/30/1999   non-critical CAD  . CATARACT EXTRACTION W/ INTRAOCULAR LENS  IMPLANT, BILATERAL  2011-2012  . ILIAC ARTERY STENT Right 11/07/2011   Diamondback orbital rotational atherectomy, PTA & stenting of calcified R CIA (Dr. Adora Fridge)  . LOWER EXTREMITY ANGIOGRAM N/A 11/07/2011   Procedure: LOWER EXTREMITY ANGIOGRAM;  Surgeon: Lorretta Harp, MD;  Location: [facility] CATH LAB;  Service: Cardiovascular;  Laterality: N/A;  . NM MYOCAR PERF WALL MOTION  09/2012   lexiscan myoview - low risk with fixed inferior defect with underlying bowel attenuation suggestive of artifact  . PERCUTANEOUS STENT INTERVENTION  11/07/2011   Procedure: PERCUTANEOUS STENT INTERVENTION;  Surgeon: Lorretta Harp, MD;  Location: [facility] CATH LAB;  Service: Cardiovascular;;  . SHOULDER ARTHROSCOPY W/ ROTATOR CUFF REPAIR Left 2010  . Sleep Study  01/09/2011   AHI during total sleep 14.4/hr and during REM 19.4/hr  . TRANSTHORACIC ECHOCARDIOGRAM  09/2012   EF 55-60%, LA mildly dilated     Current Outpatient Prescriptions  Medication Sig Dispense Refill  . alendronate (FOSAMAX) 70 MG tablet Take 70 mg by mouth once a week.     Marland Kitchen aspirin 81 MG tablet Take 81 mg by mouth daily.    Marland Kitchen ELIQUIS 5 MG TABS tablet TAKE ONE TABLET BY MOUTH TWICE A DAY 60 tablet 5  . EVENING PRIMROSE OIL PO Take 1,300 mg  by mouth daily.    . furosemide (LASIX) 20 MG tablet Take 1 tablet (20 mg total) by mouth daily. 30 tablet 12  . losartan (COZAAR) 25 MG tablet Take 25 mg by mouth daily.  3  . metoprolol succinate (TOPROL-XL) 25 MG 24 hr tablet TAKE ONE TABLET BY MOUTH DAILY 90 tablet 3  . mexiletine (MEXITIL) 150 MG capsule TAKE ONE CAPSULE BY MOUTH TWICE A DAY 60 capsule 10  . oxybutynin (DITROPAN XL) 15 MG 24 hr tablet Take 15 mg by mouth at bedtime.    . pantoprazole (PROTONIX) 40  MG tablet TAKE ONE TABLET   BY MOUTH   DAILY 90 tablet 0  . RESTASIS 0.05 % ophthalmic emulsion Apply 1 drop to eye 2 (two) times daily.    . Tamsulosin HCl (FLOMAX) 0.4 MG CAPS Take 0.4 mg by mouth daily after supper.    . traZODone (DESYREL) 100 MG tablet Take 100 mg by mouth at bedtime.  3   No current facility-administered medications for this visit.     Allergies:   Amiodarone and Bee venom   Social History:  The patient  reports that he quit smoking about 10 years ago. His smoking use included Cigarettes. He has a 55.00 pack-year smoking history. He has never used smokeless tobacco. He reports that he drinks about 8.4 oz of alcohol per week . He reports that he does not use drugs.   Family History:  The patient's family history includes Cancer in his maternal grandfather; Cancer (age of onset: 78) in his brother; Heart Problems (age of onset: 61) in his father; Heart failure in his brother.    ROS:  Please see the history of present illness.   Otherwise, review of systems is positive for none.   All other systems are reviewed and negative.   PHYSICAL EXAM: VS:  There were no vitals taken for this visit. , BMI There is no height or weight on file to calculate BMI. GEN: Well nourished, well developed, in no acute distress  HEENT: normal  Neck: no JVD, carotid bruits, or masses Cardiac: RRR; no murmurs, rubs, or gallops,no edema  Respiratory:  clear to auscultation bilaterally, normal work of breathing GI: soft, nontender, nondistended, + BS MS: no deformity or atrophy  Skin: warm and dry Neuro:  Strength and sensation are intact Psych: euthymic mood, full affect  EKG:  EKG is not ordered today. Personal review of the ekg ordered 08/08/16 shows sinus rhythm, 1 degree AV block, LAFB, rate 53    Recent Labs: No results found for requested labs within last 8760 hours.    Lipid Panel     Component Value Date/Time   CHOL 189 04/11/2013 1058   TRIG 59 04/11/2013 1058   HDL 77  04/11/2013 1058   LDLCALC 100 (H) 04/11/2013 1058     Wt Readings from Last 3 Encounters:  08/08/16 176 lb (79.8 kg)  06/21/16 174 lb 12.8 oz (79.3 kg)  02/04/16 183 lb 9.6 oz (83.3 kg)      Other studies Reviewed: Additional studies/ records that were reviewed today include: holter personally reviewed Review of the above records today demonstrates:  PAF, SVT, short runs of VT and ventricular bigeminy. 2.2 second pause, lowest HR of 35.  32% PVCs  3/16 MPI:  No perfusion defects to suggest ischemia. EF 71%.  TTE 11/24/15 - Left ventricle: The cavity size was normal. Wall thickness was   normal. Systolic function was normal. The estimated ejection  fraction was in the range of 55% to 60%. Wall motion was normal;   there were no regional wall motion abnormalities. Left   ventricular diastolic function parameters were normal. - Left atrium: The atrium was mildly dilated.  Holter Minimum HR: 34 BPM at 8:30:44 AM Maximum HR: 109 BPM at 8:49:36 PM Average HR: 66 BPM Sinus rhythm 17.4% PVCs 1.7% APCs Occasional atrial and ventricular bigeminy  ASSESSMENT AND PLAN:  1.  Atrial fib/flutter:  On Eliquis. Was taken off of amiodarone due to corneal deposits. Remains in sinus rhythm. No changes at this time.  This patients CHA2DS2-VASc Score and unadjusted Ischemic Stroke Rate (% per year) is equal to 7.2 % stroke rate/year from a score of 5  Above score calculated as 1 point each if present [CHF, HTN, DM, Vascular=MI/PAD/Aortic Plaque, Age if 65-74, or Male] Above score calculated as 2 points each if present [Age > 75, or Stroke/TIA/TE]   2. PVCs:  Has a high burden based on his monitor. Was put on mexiletine with a significant decrease in his PVC burden symptomatically. We'll continue current management.  3. Coronary artery disease: No current chest pain. No changes at this time.  Current medicines are reviewed at length with the patient today.   The patient does not have  concerns regarding his medicines.  The following changes were made today:  none  Labs/ tests ordered today include:  No orders of the defined types were placed in this encounter.    Disposition:   FU with Carl Harper 6 months  Signed, Carl Rea Meredith Leeds, MD  12/12/2016 8:14 PM     [address] [address] [id number] [phone] (office) [phone] (fax)

## 2016-12-13 ENCOUNTER — Encounter: Payer: Self-pay | Admitting: Cardiology

## 2016-12-13 ENCOUNTER — Ambulatory Visit (INDEPENDENT_AMBULATORY_CARE_PROVIDER_SITE_OTHER): Payer: Medicare HMO | Admitting: Cardiology

## 2016-12-13 VITALS — BP 112/66 | HR 53 | Ht 70.0 in | Wt 178.0 lb

## 2016-12-13 DIAGNOSIS — I480 Paroxysmal atrial fibrillation: Secondary | ICD-10-CM

## 2016-12-13 DIAGNOSIS — I493 Ventricular premature depolarization: Secondary | ICD-10-CM | POA: Diagnosis not present

## 2016-12-13 DIAGNOSIS — I2510 Atherosclerotic heart disease of native coronary artery without angina pectoris: Secondary | ICD-10-CM | POA: Diagnosis not present

## 2016-12-13 NOTE — Patient Instructions (Signed)
Medication Instructions:    Your physician recommends that you continue on your current medications as directed. Please refer to the Current Medication list given to you today.  Labwork:  None ordered  Testing/Procedures:  None ordered  Follow-Up:  Your physician wants you to follow-up in: 6 months with Dr. Camnitz.  You will receive a reminder letter in the mail two months in advance. If you don't receive a letter, please call our office to schedule the follow-up appointment.  - If you need a refill on your cardiac medications before your next appointment, please call your pharmacy.    Thank you for choosing [facility]!!   Nare Gaspari, RN [phone]         

## 2016-12-14 ENCOUNTER — Other Ambulatory Visit: Payer: Self-pay | Admitting: Internal Medicine

## 2017-02-02 ENCOUNTER — Other Ambulatory Visit: Payer: Self-pay | Admitting: Internal Medicine

## 2017-02-05 ENCOUNTER — Encounter: Payer: Self-pay | Admitting: Internal Medicine

## 2017-02-05 ENCOUNTER — Ambulatory Visit: Payer: Medicare HMO | Admitting: Internal Medicine

## 2017-02-05 VITALS — BP 105/54 | HR 65 | Ht 70.0 in | Wt 179.6 lb

## 2017-02-05 DIAGNOSIS — I493 Ventricular premature depolarization: Secondary | ICD-10-CM | POA: Diagnosis not present

## 2017-02-05 DIAGNOSIS — I2510 Atherosclerotic heart disease of native coronary artery without angina pectoris: Secondary | ICD-10-CM

## 2017-02-05 DIAGNOSIS — G4733 Obstructive sleep apnea (adult) (pediatric): Secondary | ICD-10-CM

## 2017-02-05 DIAGNOSIS — Z9989 Dependence on other enabling machines and devices: Secondary | ICD-10-CM | POA: Diagnosis not present

## 2017-02-05 DIAGNOSIS — I480 Paroxysmal atrial fibrillation: Secondary | ICD-10-CM

## 2017-02-05 DIAGNOSIS — I739 Peripheral vascular disease, unspecified: Secondary | ICD-10-CM

## 2017-02-05 MED ORDER — METOPROLOL SUCCINATE ER 25 MG PO TB24: 25.0000 mg | ORAL_TABLET | Freq: Every day | ORAL | 3 refills | Status: DC

## 2017-02-05 MED ORDER — LOSARTAN POTASSIUM 25 MG PO TABS: 25.0000 mg | ORAL_TABLET | Freq: Every day | ORAL | 3 refills | Status: DC

## 2017-02-05 MED ORDER — APIXABAN 5 MG PO TABS: 5.0000 mg | ORAL_TABLET | Freq: Two times a day (BID) | ORAL | 3 refills | Status: DC

## 2017-02-05 MED ORDER — FUROSEMIDE 20 MG PO TABS: 20.0000 mg | ORAL_TABLET | Freq: Every day | ORAL | 3 refills | Status: DC

## 2017-02-05 NOTE — Progress Notes (Signed)
Grossly normal   OFFICE NOTE  Chief Complaint:  Palpitations have improved  Primary Care Physician: Carl Limbo, MD  HPI:  Carl Harper is a pleasant 82 year old male previously followed Carl Harper with a history of peripheral arterial disease. He underwent diamondback orbital rotational atherectomy by Carl Harper in 2013 with stenting of the calcified right common iliac stenosis. He has mild nonobstructive coronary disease by cath in 2001 and a negative Myoview in 2011. He also has obstructive sleep apnea on CPAP has had reflux symptoms and atypical chest pain from time to time.  His echocardiogram does show mild concentric LVH and borderline aortic root dilatation with a possible small ascending aortic aneurysm which will need to be followed. At his last visit with Carl Harper his Toprol was cut down to 25 mg alternating with 12.5 mg every 2 some bradycardia.   Carl Harper was recently seen by Carl Harper in the office for shortness of breath and palpitations as well as exertional dyspnea. He wore the monitor however that was never performed as no monitors were available. He did undergo an exercise tolerance test for which he exercised for 6 minutes and 7 metabolic equivalents. There was no evidence of ischemia. He did have PVCs and a fairly flat blood pressure response to exercise. There was marked dyspnea with exertion. He also is reported recent tremors, some memory loss and instability with his gait.  Carl Harper returns today and is complaining of a sinking spell his chest. When pressed further he felt like he was possibly presyncopal occasionally has trouble getting his breath. He has some occasional dizziness. He was evaluated for monitor however none was placed because there was not a monitor available. He did have his metoprolol increased to 12.5/25 mg every other day. He seems to think that this is helped his symptoms and feels better than he had when he saw Carl Harper.  I  saw Carl Harper back in the office today. He says that he call and make an appointment about a month ago because his been feeling some episodes of chest discomfort in the morning. He's also felt somewhat short of breath. An EKG in the office today demonstrates new onset atrial flutter with ventricular bigeminy at a rate of 77. This is a new diagnosis for him. I spent greater than 10 minutes discussing atrial fibrillation and anticoagulation options with him. He understands his options and the need for trying to establish a sinus rhythm.  Carl Harper returns today the office for follow-up. His EKG today fortunately shows sinus rhythm with PVCs. This is good news as we will not need to schedule him for a cardioversion. He has been taking Eliquis and feels that this is tolerable. He denies any bleeding problems. Here she does feel improvement in energy and I suspect this is from converting back to sinus rhythm at some point. Based on his TIA event, I suspect it may been related to paroxysmal atrial flutter. He did have carotid Dopplers which show very mild bilateral carotid disease and are not likely the source of his TIA event.  I saw Carl Harper back today in the office. He is maintaining sinus bradycardia with first degree AV block at a rate of 52. He denies any bleeding problems on Eliquis. He is on low-dose amiodarone. This was started by Carl Harper on 12/30/14, after monitor was placed which showed a very high burden of PVCs greater than 30% as well as A. fib. A repeat echo was performed which  showed preserved LV systolic function. Carl Harper was symptomatic with his PVCs. He reports a marked reduction in his symptoms after starting the amiodarone. I do not see these had baseline pulmonary function testing, thyroid or liver function tests.  11/09/2015  Carl Harper returns today for follow-up. He has had recent worsening shortness of breath and significant fatigue and leg weakness with exercise. This is of  fairly recent finding. He was seen by Carl Harper, who noted that recently Mr. Cea ophthalmologist had recommended discontinuing amiodarone due to corneal deposits. Since that time, he has been having a washout of the amiodarone with the plan to reassess his burden of PVCs by monitoring at the end of September. This is a ready been scheduled. His last echo was in 2016 and showed normal LV function, but he has had new symptoms as described above and would likely benefit from a repeat echo to rule out cardiomyopathy.  02/04/2016  Carl Harper returns today for follow-up. He has been seen by Carl Harper for evaluation of his a-fib. He was on amiodarone, but we discontinued it due to corneal deposits. He has maintained sinus rhythm without recurrent atrial fibrillation. PVCs were noted and noted to be significant and he was started on mexiletine. Since that he's had some improvement in his symptoms although was noted to have some PVCs today. He has a follow-up with Carl Harper in a few months. We repeated an echocardiogram which showed normal systolic and diastolic function with mild left atrial enlargement.  08/10/2016  Carl Harper returns today. He reports his palpitations have improved significantly with the addition of mexiletine. He is seen Carl Harper with cardiac electrophysiology for this. Blood pressure is well-controlled today 102/52. EKG which is personally reviewed shows sinus bradycardia with marked sinus arrhythmia and first-degree AV block at 53. QTC is 377 ms with a left anterior fascicular block and first-degree AV block.   02/05/2017  Carl Harper was seen today in follow-up.  Overall is without complaints.  He denies any significant palpitations, shortness of breath, chest pain or lower extremity claudication.  He is been successfully treated on mexiletine and although he continues to have some PVCs, the frequency and burden have decreased significantly.  Blood pressure is at goal.   EKG was personally reviewed today which is sinus rhythm, first-degree AV block and occasional PVCs, left anterior fascicular block and QTC 413 ms.  PMHx:  Past Medical History:  Diagnosis Date  . Benign neoplasm of colon 10/24/2006  . BPH (benign prostatic hyperplasia)   . Daily headache   . Diverticulosis 09/03.2008  . Dysrhythmia    "irregular"  . Hypertension   . Internal hemorrhoids without mention of complication 02/28/3233  . Memory loss   . PAD (peripheral artery disease) ([place])   . Restless leg syndrome   . Sleep apnea    dx'd; wore mask; lost weight; turned in machine"    Past Surgical History:  Procedure Laterality Date  . APPENDECTOMY  ~ 1954  . ATHERECTOMY Right 11/07/2011   Procedure: ATHERECTOMY;  Surgeon: Lorretta Harp, MD;  Location: [facility] CATH LAB;  Service: Cardiovascular;  Laterality: Right;  . CARDIAC CATHETERIZATION  03/30/1999   non-critical CAD  . CATARACT EXTRACTION W/ INTRAOCULAR LENS  IMPLANT, BILATERAL  2011-2012  . ILIAC ARTERY STENT Right 11/07/2011   Diamondback orbital rotational atherectomy, PTA & stenting of calcified R CIA (Dr. Adora Fridge)  . LOWER EXTREMITY ANGIOGRAM N/A 11/07/2011   Procedure: LOWER EXTREMITY ANGIOGRAM;  Surgeon: Pearletha Forge  Gwenlyn Found, MD;  Location: [facility] CATH LAB;  Service: Cardiovascular;  Laterality: N/A;  . NM MYOCAR PERF WALL MOTION  09/2012   lexiscan myoview - low risk with fixed inferior defect with underlying bowel attenuation suggestive of artifact  . PERCUTANEOUS STENT INTERVENTION  11/07/2011   Procedure: PERCUTANEOUS STENT INTERVENTION;  Surgeon: Lorretta Harp, MD;  Location: [facility] CATH LAB;  Service: Cardiovascular;;  . SHOULDER ARTHROSCOPY W/ ROTATOR CUFF REPAIR Left 2010  . Sleep Study  01/09/2011   AHI during total sleep 14.4/hr and during REM 19.4/hr  . TRANSTHORACIC ECHOCARDIOGRAM  09/2012   EF 55-60%, LA mildly dilated    FAMHx:  Family History  Problem Relation Age of Onset  . Heart Problems Father 50  . Cancer  Brother 38  . Heart failure Brother   . Cancer Maternal Grandfather        prostate    SOCHx:   reports that he quit smoking about 10 years ago. His smoking use included cigarettes. He has a 55.00 pack-year smoking history. he has never used smokeless tobacco. He reports that he drinks about 8.4 oz of alcohol per week. He reports that he does not use drugs.  ALLERGIES:  Allergies  Allergen Reactions  . Amiodarone Other (See Comments)    Other reaction(s): Other (See Comments) Corneal deposits Corneal deposits  . Bee Venom Other (See Comments)    unknown    ROS: Pertinent items noted in HPI and remainder of comprehensive ROS otherwise negative.  HOME MEDS: Current Outpatient Medications  Medication Sig Dispense Refill  . alendronate (FOSAMAX) 70 MG tablet Take 70 mg by mouth once a week.     Marland Kitchen aspirin 81 MG tablet Take 81 mg by mouth daily.    Marland Kitchen ELIQUIS 5 MG TABS tablet TAKE ONE TABLET BY MOUTH TWICE A DAY 60 tablet 4  . EVENING PRIMROSE OIL PO Take 1,300 mg by mouth daily.    . furosemide (LASIX) 20 MG tablet TAKE ONE TABLET BY MOUTH DAILY 90 tablet 8  . losartan (COZAAR) 25 MG tablet Take 25 mg by mouth daily.  3  . metoprolol succinate (TOPROL-XL) 25 MG 24 hr tablet TAKE ONE TABLET BY MOUTH DAILY 90 tablet 3  . mexiletine (MEXITIL) 150 MG capsule TAKE ONE CAPSULE BY MOUTH TWICE A DAY 60 capsule 10  . oxybutynin (DITROPAN XL) 15 MG 24 hr tablet Take 15 mg by mouth at bedtime.    . pantoprazole (PROTONIX) 40 MG tablet TAKE ONE TABLET   BY MOUTH   DAILY 90 tablet 0  . RESTASIS 0.05 % ophthalmic emulsion Apply 1 drop to eye 2 (two) times daily.    . Tamsulosin HCl (FLOMAX) 0.4 MG CAPS Take 0.4 mg by mouth daily after supper.    . traZODone (DESYREL) 100 MG tablet Take 100 mg by mouth at bedtime.  3   No current facility-administered medications for this visit.     LABS/IMAGING: No results Harper for this or any previous visit (from the past 48 hour(s)). No results  Harper.  VITALS: BP (!) 105/54   Pulse 65   Ht 5\' 10"  (1.778 m)   Wt 179 lb 9.6 oz (81.5 kg)   BMI 25.77 kg/m   EXAM: General appearance: alert and no distress Neck: no carotid bruit, no JVD and thyroid not enlarged, symmetric, no tenderness/mass/nodules Lungs: clear to auscultation bilaterally Heart: regular rate and rhythm, S1, S2 normal, no murmur, click, rub or gallop Abdomen: soft, non-tender; bowel sounds normal; no  masses,  no organomegaly Extremities: extremities normal, atraumatic, no cyanosis or edema Pulses: 2+ and symmetric Skin: Skin color, texture, turgor normal. No rashes or lesions Neurologic: Grossly normal Psych: Pleasant  EKG: Sinus rhythm with sinus arrhythmia and first-degree AV block, occasional PVCs, left anterior fascicular block at 65-personally reviewed  ASSESSMENT: 1. 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 PVC's -suppressed with mexilitine 3. PAD status post diamondback were orbital atherectomy and stenting of the right common iliac artery 4. Hypertension 5. Dyslipidemia 6. Mild CAD 7. Obstructive sleep apnea on CPAP 8. DOE - negative treadmill stress test 9. Tremor/memory loss/gait abnormalities - no evidence of Parkinson's by recent neurology consult  PLAN: 1.   Carl Harper T is to be asymptomatic.  He has some PVCs but they seem to be for the most part suppressed on mexiletine.  He is on Eliquis without any bleeding difficulty.  He denies any symptoms of PAD and has had prior diamondback atherectomy.  Blood pressure is well controlled.  He is compliant with CPAP.  Overall he is feeling well.  Follow-up with me in 6 months or sooner as necessary.    Pixie Casino, MD, [facility], [place] Director of the [facility] Lipid [facility]  Card[facility] Attending Cardiologist  Direct Dial: [phone]   Fax: [phone]  Website:  [email or web address] Dekari Bures 02/05/2017, 1:59 PM

## 2017-02-05 NOTE — Patient Instructions (Signed)
Your physician wants you to follow-up in: 6 months with Dr. Hilty. You will receive a reminder letter in the mail two months in advance. If you don't receive a letter, please call our office to schedule the follow-up appointment.    

## 2017-02-26 ENCOUNTER — Telehealth: Payer: Self-pay | Admitting: Internal Medicine

## 2017-02-26 NOTE — Telephone Encounter (Signed)
New message   Patient calling to find out if he is safe to use an exercise bike with his heart condition. Please call

## 2017-02-26 NOTE — Telephone Encounter (Signed)
Patient aware OK to use bike per Dr. Debara Pickett

## 2017-05-11 ENCOUNTER — Other Ambulatory Visit: Payer: Self-pay | Admitting: Cardiology

## 2017-06-11 ENCOUNTER — Telehealth: Payer: Self-pay | Admitting: Internal Medicine

## 2017-06-11 NOTE — Telephone Encounter (Signed)
Spoke to patient, patient states he has been having 2-3 episodes of CP per week x 2-3 weeks now.  States minor SOB, denies swelling, palpitations, dizziness, fatigue.   Checks HR and BP at home but has not checked in the last couple of days.   Pain does not increase with exertion.   No pain at current.  No distress noted at current.    Patient has appt tomorrow at 2 pm with Arnold Long DNP.    Advised patient is symptoms worsen to proceed to ER for evaluation.  Patient agreed and verbalized understanding.

## 2017-06-12 ENCOUNTER — Ambulatory Visit: Payer: Medicare HMO | Admitting: Adult Health

## 2017-06-12 ENCOUNTER — Encounter: Payer: Self-pay | Admitting: Adult Health

## 2017-06-12 VITALS — BP 112/60 | HR 58 | Ht 70.0 in | Wt 174.0 lb

## 2017-06-12 DIAGNOSIS — I493 Ventricular premature depolarization: Secondary | ICD-10-CM | POA: Diagnosis not present

## 2017-06-12 DIAGNOSIS — I4892 Unspecified atrial flutter: Secondary | ICD-10-CM

## 2017-06-12 DIAGNOSIS — I480 Paroxysmal atrial fibrillation: Secondary | ICD-10-CM

## 2017-06-12 DIAGNOSIS — Z79899 Other long term (current) drug therapy: Secondary | ICD-10-CM | POA: Diagnosis not present

## 2017-06-12 MED ORDER — NITROGLYCERIN 0.4 MG SL SUBL: 0.4000 mg | SUBLINGUAL_TABLET | SUBLINGUAL | 3 refills | Status: DC | PRN

## 2017-06-12 NOTE — Progress Notes (Signed)
Cardiology Office Note   Date:  06/12/2017   ID:  CANDACE RAMUS, DOB March 05, 1934, MRN [id number]  PCP:  Bernerd Limbo, MD  Cardiologist:  Dr. Debara Pickett Chief Complaint  Patient presents with  . Chest Pain  . Coronary Artery Disease  . Atrial Flutter    Paroxymal      History of Present Illness: Carl Harper is a 83 y.o. male who presents for ongoing assessment and management of PAD with history of diamondback orbital rotational atherectomy by Dr. Gwenlyn Found to a calcified right common iliac stenosis 2013.  Other history includes OSA on CPAP, followed by a negative Myoview in 2011.  The patient has a history of atypical chest pain.    Additionally, the patient had documented history of new onset atrial flutter with ventricular bigeminy at a rate of 77 bpm office visit with Dr. Percival Spanish in the setting of shortness of breath.  The patient was placed on Eliquis and plan for cardioversion if he did not convert on metoprolol.  On follow-up appointment a week later the patient found to be in normal sinus rhythm with PVCs and therefore cardioversion was found to be unnecessary.  The patient was followed by Dr. Curt Bears, electrophysiologist, in the setting of frequent PVCs.  He was placed on mexiltine.   His recent echocardiogram dated October 2017 revealed normal LV systolic function EF of 72% to 60% with no wall motion abnormalities.  There was no stenosis his aortic valve, the ascending aorta was mildly dilated.  On last office visit with Dr. Debara Pickett on 02/05/2017 his EKG revealed normal sinus rhythm with first-degree AV block with left anterior fascicular block QTC 413 ms.  At that time he was without complaints.  The patient called our office on 06/11/2017 with complaints of 2-3 episodes of chest pain per week minor shortness of breath.  Pain does not worsen with exertion.  In the office today, the patient also complains of multiple aches and pains and cramping.  He denies significant fatigue.  He states  that he takes an additional half a tablet of metoprolol when he has chest discomfort which helps to alleviate symptoms.  He has had to do that approximately once a week most recently 2 days ago.   Past Medical History:  Diagnosis Date  . Benign neoplasm of colon 10/24/2006  . BPH (benign prostatic hyperplasia)   . Daily headache   . Diverticulosis 09/03.2008  . Dysrhythmia    "irregular"  . Hypertension   . Internal hemorrhoids without mention of complication 53/66/4403  . Memory loss   . PAD (peripheral artery disease) ([place])   . Restless leg syndrome   . Sleep apnea    dx'd; wore mask; lost weight; turned in machine"    Past Surgical History:  Procedure Laterality Date  . APPENDECTOMY  ~ 1954  . ATHERECTOMY Right 11/07/2011   Procedure: ATHERECTOMY;  Surgeon: Lorretta Harp, MD;  Location: [facility] CATH LAB;  Service: Cardiovascular;  Laterality: Right;  . CARDIAC CATHETERIZATION  03/30/1999   non-critical CAD  . CATARACT EXTRACTION W/ INTRAOCULAR LENS  IMPLANT, BILATERAL  2011-2012  . ILIAC ARTERY STENT Right 11/07/2011   Diamondback orbital rotational atherectomy, PTA & stenting of calcified R CIA (Dr. Adora Fridge)  . LOWER EXTREMITY ANGIOGRAM N/A 11/07/2011   Procedure: LOWER EXTREMITY ANGIOGRAM;  Surgeon: Lorretta Harp, MD;  Location: [facility] CATH LAB;  Service: Cardiovascular;  Laterality: N/A;  . NM MYOCAR PERF WALL MOTION  09/2012   lexiscan myoview -  low risk with fixed inferior defect with underlying bowel attenuation suggestive of artifact  . PERCUTANEOUS STENT INTERVENTION  11/07/2011   Procedure: PERCUTANEOUS STENT INTERVENTION;  Surgeon: Lorretta Harp, MD;  Location: [facility] CATH LAB;  Service: Cardiovascular;;  . SHOULDER ARTHROSCOPY W/ ROTATOR CUFF REPAIR Left 2010  . Sleep Study  01/09/2011   AHI during total sleep 14.4/hr and during REM 19.4/hr  . TRANSTHORACIC ECHOCARDIOGRAM  09/2012   EF 55-60%, LA mildly dilated     Current Outpatient Medications  Medication Sig  Dispense Refill  . apixaban (ELIQUIS) 5 MG TABS tablet Take 1 tablet (5 mg total) by mouth 2 (two) times daily. 180 tablet 3  . aspirin 81 MG tablet Take 81 mg by mouth daily.    Marland Kitchen EVENING PRIMROSE OIL PO Take 1,300 mg by mouth daily.    . furosemide (LASIX) 20 MG tablet Take 1 tablet (20 mg total) by mouth daily. 90 tablet 3  . losartan (COZAAR) 25 MG tablet Take 1 tablet (25 mg total) by mouth daily. 90 tablet 3  . metoprolol succinate (TOPROL-XL) 25 MG 24 hr tablet Take 1 tablet (25 mg total) by mouth daily. 90 tablet 3  . mexiletine (MEXITIL) 150 MG capsule TAKE ONE CAPSULE BY MOUTH TWICE A DAY 60 capsule 6  . oxybutynin (DITROPAN XL) 15 MG 24 hr tablet Take 15 mg by mouth at bedtime.    . pantoprazole (PROTONIX) 40 MG tablet TAKE ONE TABLET   BY MOUTH   DAILY 90 tablet 0  . RESTASIS 0.05 % ophthalmic emulsion Apply 1 drop to eye 2 (two) times daily.    . Tamsulosin HCl (FLOMAX) 0.4 MG CAPS Take 0.4 mg by mouth daily after supper.    . traZODone (DESYREL) 100 MG tablet Take 100 mg by mouth at bedtime.  3  . nitroGLYCERIN (NITROSTAT) 0.4 MG SL tablet Place 1 tablet (0.4 mg total) under the tongue every 5 (five) minutes as needed for chest pain. 25 tablet 3   No current facility-administered medications for this visit.     Allergies:   Amiodarone and Bee venom    Social History:  The patient  reports that he quit smoking about 11 years ago. His smoking use included cigarettes. He has a 55.00 pack-year smoking history. He has never used smokeless tobacco. He reports that he drinks about 8.4 oz of alcohol per week. He reports that he does not use drugs.   Family History:  The patient's family history includes Cancer in his maternal grandfather; Cancer (age of onset: 75) in his brother; Heart Problems (age of onset: 22) in his father; Heart failure in his brother.    ROS: All other systems are reviewed and negative. Unless otherwise mentioned in H&P    PHYSICAL EXAM: VS:  BP 112/60    Pulse (!) 58   Ht 5\' 10"  (1.778 m)   Wt 174 lb (78.9 kg)   BMI 24.97 kg/m  , BMI Body mass index is 24.97 kg/m. GEN: Well nourished, well developed, in no acute distress  HEENT: normal  Neck: no JVD, carotid bruits, or masses Cardiac: RRR; soft 1/6 systolic murmurs, rubs, or gallops,no edema  Respiratory:  clear to auscultation bilaterally, normal work of breathing GI: soft, nontender, nondistended, + BS MS: no deformity or atrophy  Skin: warm and dry, no rash Neuro:  Strength and sensation are intact Psych: euthymic mood, full affect   EKG: Sinus bradycardia with 1 degree AVB.  PR interval 0.218 seconds, left  anterior fascicular block heart rate of 58 bpm  Recent Labs: No results found for requested labs within last 8760 hours.    Lipid Panel    Component Value Date/Time   CHOL 189 04/11/2013 1058   TRIG 59 04/11/2013 1058   HDL 77 04/11/2013 1058   LDLCALC 100 (H) 04/11/2013 1058      Wt Readings from Last 3 Encounters:  06/12/17 174 lb (78.9 kg)  02/05/17 179 lb 9.6 oz (81.5 kg)  12/13/16 178 lb (80.7 kg)      Other studies Reviewed: Echocardiogram Dec 08, 2015 Left ventricle: The cavity size was normal. Wall thickness was   normal. Systolic function was normal. The estimated ejection   fraction was in the range of 55% to 60%. Wall motion was normal;   there were no regional wall motion abnormalities. Left   ventricular diastolic function parameters were normal. - Left atrium: The atrium was mildly dilated.  ASSESSMENT AND PLAN:  1.  Chest pain: He continues to have intermittent chest pain for which he takes an additional half a tablet of metoprolol.  He also complains of generalized muscle aches and pains.  I am going to check a BMET and a magnesium to evaluate his status as he is on furosemide without potassium replacement and also a PPI.  It may be that he just needs repletion which will help his generalized aches and pains.  Most recent cardiac catheterization  did not reveal any critical CAD.  2.  Hypertension: The patient is soft concerning blood pressure today in the office visit with a blood pressure of 112/60.  The patient is currently on losartan 25 mg daily along with the furosemide and metoprolol.  May consider decreasing ARB on next visit if he remains hypotensive.  With his age it may be better to allow for permissive hypertension (between 135/70-140/78) in this elderly gentleman.  3.  PAD: Has been followed by Dr.Berry.  Continues with complaints of restless legs.  But no severe pain in his legs.  4.  Paroxysmal atrial flutter: Remains on Eliquis 5 mg twice daily with good heart rate control.  Currently the patient is in sinus bradycardia heart rate of 58 bpm.  Current medicines are reviewed at length with the patient today.    Labs/ tests ordered today include: BMET, magnesium.  Carl Harper. [place] Pugh, ANP, AACC   06/12/2017 5:03 PM    [facility] [address], [place], [place] [address] Phone: [phone]; Fax: [phone]

## 2017-06-12 NOTE — Patient Instructions (Signed)
Labwork: BMET TODAY HERE IN OUR OFFICE AT [facility]  Take the provided lab slips with you to the lab for your blood draw.   Follow-Up: Your physician wants you to follow-up in: [place]   Thank you for choosing [facility] at [facility]!!

## 2017-06-13 ENCOUNTER — Other Ambulatory Visit: Payer: Self-pay

## 2017-06-13 LAB — BASIC METABOLIC PANEL
BUN/Creatinine Ratio: 17 (ref 10–24)
BUN: 23 mg/dL (ref 8–27)
CHLORIDE: 107 mmol/L — AB (ref 96–106)
CO2: 24 mmol/L (ref 20–29)
Calcium: 9.4 mg/dL (ref 8.6–10.2)
Creatinine, Ser: 1.32 mg/dL — ABNORMAL HIGH (ref 0.76–1.27)
GFR calc Af Amer: 58 mL/min/{1.73_m2} — ABNORMAL LOW (ref >59)
GFR calc non Af Amer: 50 mL/min/{1.73_m2} — ABNORMAL LOW (ref >59)
GLUCOSE: 99 mg/dL (ref 65–99)
POTASSIUM: 4.7 mmol/L (ref 3.5–5.2)
Sodium: 144 mmol/L (ref 134–144)

## 2017-06-13 LAB — MAGNESIUM: MAGNESIUM: 2.1 mg/dL (ref 1.6–2.3)

## 2017-06-13 MED ORDER — MAGNESIUM 200 MG PO TABS: 200.0000 mg | ORAL_TABLET | Freq: Every day | ORAL | 4 refills | Status: DC

## 2017-07-26 ENCOUNTER — Telehealth: Payer: Self-pay | Admitting: Internal Medicine

## 2017-07-26 NOTE — Telephone Encounter (Signed)
° °  [facility] Pre-operative Risk Assessment      Request for surgical clearance:  1. What type of surgery is being performed? Colonoscopy  2. When is this surgery scheduled? 6.10.19  3. What type of clearance is required (medical clearance vs. Pharmacy clearance to hold med vs. Both)? Medicine  4. Are there any medications that need to be held prior to surgery and how long?Eliquis/ hold for 2 days or 4 doses   5. Practice name and name of physician performing surgery? [facility]/Dr Katopes  6. What is your office phone number  [phone]    7.   What is your office fax number  [phone]  8.   Anesthesia type (None, local, MAC, general) ? General   Pauline Good 07/26/2017, 3:54 PM  _________________________________________________________________   (provider comments below)

## 2017-07-26 NOTE — Telephone Encounter (Addendum)
   Primary Cardiologist: Pixie Casino, MD  Chart reviewed as part of pre-operative protocol coverage H/o PAD, atrial flutter, OSA, CPAP, PVCs, mexilitine, LAFB. Last OV 05/2017 at which time he was having intermittent CP. Kathryn's note states"Most recent cardiac catheterization did not reveal any critical CAD." Most recent cath I can find was in 2001 so will need phone call to clear prior to colonoscopy especially since it's listing that they will use general anesthesia. Will route to pharm for input on anticoagulation and will need to call pt once this is known.  Charlie Pitter, PA-C 07/26/2017, 5:18 PM

## 2017-07-27 ENCOUNTER — Encounter: Payer: Self-pay | Admitting: Internal Medicine

## 2017-07-27 NOTE — Telephone Encounter (Signed)
Calling concerning status of pre op clearance that was sent yesterday. Please advise

## 2017-07-27 NOTE — Telephone Encounter (Signed)
This encounter was created in error - please disregard.

## 2017-07-27 NOTE — Telephone Encounter (Signed)
   Primary Cardiologist: Pixie Casino, MD  Chart reviewed as part of pre-operative protocol coverage. Given past medical history and time since last visit, based on ACC/AHA guidelines, Carl Harper would be at acceptable risk for the planned procedure without further cardiovascular testing.   OK to hold Eliquis and aspirin 2 days pre op if needed.  I will route this recommendation to the requesting party via Epic fax function and remove from pre-op pool.  Please call with questions.  Kerin Ransom, PA-C 07/27/2017, 10:23 AM

## 2017-07-27 NOTE — Telephone Encounter (Signed)
Patient with diagnosis of Afib on Eliquis for anticoagulation.    Procedure: colonoscopy Date of procedure: 07/30/17  CHADS2-VASc score of  3 (CHF, HTN, AGE, DM2, stroke/tia x 2, CAD, AGE, male)  CrCl 31ml/min  Per office protocol, patient can hold Eliquis for 1-2 days prior to procedure.

## 2017-09-11 ENCOUNTER — Ambulatory Visit: Payer: Medicare HMO | Admitting: Internal Medicine

## 2017-09-11 ENCOUNTER — Encounter: Payer: Self-pay | Admitting: Internal Medicine

## 2017-09-11 VITALS — BP 108/58 | HR 46 | Ht 70.0 in | Wt 172.0 lb

## 2017-09-11 DIAGNOSIS — I2510 Atherosclerotic heart disease of native coronary artery without angina pectoris: Secondary | ICD-10-CM | POA: Diagnosis not present

## 2017-09-11 DIAGNOSIS — I480 Paroxysmal atrial fibrillation: Secondary | ICD-10-CM

## 2017-09-11 DIAGNOSIS — I739 Peripheral vascular disease, unspecified: Secondary | ICD-10-CM | POA: Diagnosis not present

## 2017-09-11 DIAGNOSIS — I493 Ventricular premature depolarization: Secondary | ICD-10-CM | POA: Diagnosis not present

## 2017-09-11 NOTE — Patient Instructions (Signed)
Your physician wants you to follow-up in: 6 months with Dr. Hilty. You will receive a reminder letter in the mail two months in advance. If you don't receive a letter, please call our office to schedule the follow-up appointment.    

## 2017-09-11 NOTE — Progress Notes (Signed)
OFFICE NOTE  Chief Complaint:  No complaints  Primary Care Physician: Bernerd Limbo, MD  HPI:  Carl Harper is a pleasant 83 year old male previously followed Dr. Rollene Fare with a history of peripheral arterial disease. He underwent diamondback orbital rotational atherectomy by Dr. Gwenlyn Found in 2013 with stenting of the calcified right common iliac stenosis. He has mild nonobstructive coronary disease by cath in 2001 and a negative Myoview in 2011. He also has obstructive sleep apnea on CPAP has had reflux symptoms and atypical chest pain from time to time.  His echocardiogram does show mild concentric LVH and borderline aortic root dilatation with a possible small ascending aortic aneurysm which will need to be followed. At his last visit with Dr. Rollene Fare his Toprol was cut down to 25 mg alternating with 12.5 mg every 2 some bradycardia.   [name] was recently seen by Dr. Percival Spanish in the office for shortness of breath and palpitations as well as exertional dyspnea. He wore the monitor however that was never performed as no monitors were available. He did undergo an exercise tolerance test for which he exercised for 6 minutes and 7 metabolic equivalents. There was no evidence of ischemia. He did have PVCs and a fairly flat blood pressure response to exercise. There was marked dyspnea with exertion. He also is reported recent tremors, some memory loss and instability with his gait.  [name] returns today and is complaining of a sinking spell his chest. When pressed further he felt like he was possibly presyncopal occasionally has trouble getting his breath. He has some occasional dizziness. He was evaluated for monitor however none was placed because there was not a monitor available. He did have his metoprolol increased to 12.5/25 mg every other day. He seems to think that this is helped his symptoms and feels better than he had when he saw Dr. Percival Spanish.  I saw [name] back in the  office today. He says that he call and make an appointment about a month ago because his been feeling some episodes of chest discomfort in the morning. He's also felt somewhat short of breath. An EKG in the office today demonstrates new onset atrial flutter with ventricular bigeminy at a rate of 77. This is a new diagnosis for him. I spent greater than 10 minutes discussing atrial fibrillation and anticoagulation options with him. He understands his options and the need for trying to establish a sinus rhythm.  [name] returns today the office for follow-up. His EKG today fortunately shows sinus rhythm with PVCs. This is good news as we will not need to schedule him for a cardioversion. He has been taking Eliquis and feels that this is tolerable. He denies any bleeding problems. Here she does feel improvement in energy and I suspect this is from converting back to sinus rhythm at some point. Based on his TIA event, I suspect it may been related to paroxysmal atrial flutter. He did have carotid Dopplers which show very mild bilateral carotid disease and are not likely the source of his TIA event.  I saw [name] back today in the office. He is maintaining sinus bradycardia with first degree AV block at a rate of 52. He denies any bleeding problems on Eliquis. He is on low-dose amiodarone. This was started by Dr. Curt Bears on 12/30/14, after monitor was placed which showed a very high burden of PVCs greater than 30% as well as A. fib. A repeat echo was performed which showed preserved LV  systolic function. Mr. Olivares was symptomatic with his PVCs. He reports a marked reduction in his symptoms after starting the amiodarone. I do not see these had baseline pulmonary function testing, thyroid or liver function tests.  11/09/2015  Mr. Wooldridge returns today for follow-up. He has had recent worsening shortness of breath and significant fatigue and leg weakness with exercise. This is of fairly recent finding.  He was seen by Dr. Curt Bears, who noted that recently Mr. Neubecker ophthalmologist had recommended discontinuing amiodarone due to corneal deposits. Since that time, he has been having a washout of the amiodarone with the plan to reassess his burden of PVCs by monitoring at the end of September. This is a ready been scheduled. His last echo was in 2016 and showed normal LV function, but he has had new symptoms as described above and would likely benefit from a repeat echo to rule out cardiomyopathy.  02/04/2016  Mr. Bellows returns today for follow-up. He has been seen by Dr. Curt Bears for evaluation of his a-fib. He was on amiodarone, but we discontinued it due to corneal deposits. He has maintained sinus rhythm without recurrent atrial fibrillation. PVCs were noted and noted to be significant and he was started on mexiletine. Since that he's had some improvement in his symptoms although was noted to have some PVCs today. He has a follow-up with Dr. Curt Bears in a few months. We repeated an echocardiogram which showed normal systolic and diastolic function with mild left atrial enlargement.  08/10/2016  Mr. Utke returns today. He reports his palpitations have improved significantly with the addition of mexiletine. He is seen Dr. Curt Bears with cardiac electrophysiology for this. Blood pressure is well-controlled today 102/52. EKG which is personally reviewed shows sinus bradycardia with marked sinus arrhythmia and first-degree AV block at 53. QTC is 377 ms with a left anterior fascicular block and first-degree AV block.   02/05/2017  Mr. Higbie was seen today in follow-up.  Overall is without complaints.  He denies any significant palpitations, shortness of breath, chest pain or lower extremity claudication.  He is been successfully treated on mexiletine and although he continues to have some PVCs, the frequency and burden have decreased significantly.  Blood pressure is at goal.  EKG was personally  reviewed today which is sinus rhythm, first-degree AV block and occasional PVCs, left anterior fascicular block and QTC 413 ms.  09/11/2017  Mr. Wegmann returns today for follow-up.  Again he is doing well without complaints.  He denies any palpitations or significant arrhythmias.  He denies any chest pain or worsening shortness of breath.  He occasionally gets some fatigue with working out in the hot weather for long periods of time.  This may be related to some low normal blood pressure.  He is using Lasix as needed.  Blood pressure today was 108/58 however he generally says that his blood pressures around 211 systolic.  PMHx:  Past Medical History:  Diagnosis Date  . Benign neoplasm of colon 10/24/2006  . BPH (benign prostatic hyperplasia)   . Daily headache   . Diverticulosis 09/03.2008  . Dysrhythmia    "irregular"  . Hypertension   . Internal hemorrhoids without mention of complication 94/17/4081  . Memory loss   . PAD (peripheral artery disease) ([place])   . Restless leg syndrome   . Sleep apnea    dx'd; wore mask; lost weight; turned in machine"    Past Surgical History:  Procedure Laterality Date  . APPENDECTOMY  ~ 1954  .  ATHERECTOMY Right 11/07/2011   Procedure: ATHERECTOMY;  Surgeon: Lorretta Harp, MD;  Location[facility] C[facility] LAB;  Service: Cardiovascular;  Laterality: Right;  . CARDIAC CATHETERIZATION  03/30/1999   non-critical CAD  . CATARACT EXTRACTION W/ INTRAOCULAR LENS  IMPLANT, BILATERAL  2011-2012  . ILIAC ARTERY STENT Right 11/07/2011   Diamondback orbital rotational atherectomy, PTA & stenting of calcified R CIA (Dr. Adora Fridge)  . LOWER EXTREMITY ANGIOGRAM N/A 11/07/2011   Procedure: LOWER EXTREMITY ANGIOGRAM;  Surgeon: Lorretta Harp, MD;  Location: [facility] CATH LAB;  Service: Cardiovascular;  Laterality: N/A;  . NM MYOCAR PERF WALL MOTION  09/2012   lexiscan myoview - low risk with fixed inferior defect with underlying bowel attenuation suggestive of artifact  .  PERCUTANEOUS STENT INTERVENTION  11/07/2011   Procedure: PERCUTANEOUS STENT INTERVENTION;  Surgeon: Lorretta Harp, MD;  Location: [facility] CATH LAB;  Service: Cardiovascular;;  . SHOULDER ARTHROSCOPY W/ ROTATOR CUFF REPAIR Left 2010  . Sleep Study  01/09/2011   AHI during total sleep 14.4/hr and during REM 19.4/hr  . TRANSTHORACIC ECHOCARDIOGRAM  09/2012   EF 55-60%, LA mildly dilated    FAMHx:  Family History  Problem Relation Age of Onset  . Heart Problems Father 47  . Cancer Brother 17  . Heart failure Brother   . Cancer Maternal Grandfather        prostate    SOCHx:   reports that he quit smoking about 11 years ago. His smoking use included cigarettes. He has a 55.00 pack-year smoking history. He has never used smokeless tobacco. He reports that he drinks about 8.4 oz of alcohol per week. He reports that he does not use drugs.  ALLERGIES:  Allergies  Allergen Reactions  . Amiodarone Other (See Comments)    Other reaction(s): Other (See Comments) Corneal deposits Corneal deposits  . Bee Venom Other (See Comments)    unknown    ROS: Pertinent items noted in HPI and remainder of comprehensive ROS otherwise negative.  HOME MEDS: Current Outpatient Medications  Medication Sig Dispense Refill  . apixaban (ELIQUIS) 5 MG TABS tablet Take 1 tablet (5 mg total) by mouth 2 (two) times daily. 180 tablet 3  . aspirin 81 MG tablet Take 81 mg by mouth daily.    Marland Kitchen BIOTIN PO Take by mouth.    . EVENING PRIMROSE OIL PO Take 1,300 mg by mouth daily.    . finasteride (PROSCAR) 5 MG tablet Take 5 mg by mouth daily.    . furosemide (LASIX) 20 MG tablet Take 1 tablet (20 mg total) by mouth daily. (Patient taking differently: Take 20 mg by mouth as needed. ) 90 tablet 3  . HYDROcodone-acetaminophen (NORCO) 10-325 MG tablet Take by mouth.     . losartan (COZAAR) 25 MG tablet Take 1 tablet (25 mg total) by mouth daily. 90 tablet 3  . Magnesium 200 MG TABS Take 1 tablet (200 mg total) by mouth  daily. 30 each 4  . metoprolol succinate (TOPROL-XL) 25 MG 24 hr tablet Take 1 tablet (25 mg total) by mouth daily. 90 tablet 3  . mexiletine (MEXITIL) 150 MG capsule TAKE ONE CAPSULE BY MOUTH TWICE A DAY 60 capsule 6  . nitroGLYCERIN (NITROSTAT) 0.4 MG SL tablet Place 0.4 mg under the tongue every 5 (five) minutes as needed for chest pain.    . pramipexole (MIRAPEX) 0.75 MG tablet Take by mouth.    . RESTASIS 0.05 % ophthalmic emulsion Apply 1 drop to eye 2 (two)  times daily.    . Tamsulosin HCl (FLOMAX) 0.4 MG CAPS Take 0.4 mg by mouth daily after supper.    . traZODone (DESYREL) 100 MG tablet Take 100 mg by mouth at bedtime.  3  . vitamin B-12 (CYANOCOBALAMIN) 100 MCG tablet Take by mouth.    . LUTEIN PO Take by mouth.     No current facility-administered medications for this visit.     LABS/IMAGING: No results found for this or any previous visit (from the past 48 hour(s)). No results found.  VITALS: BP (!) 108/58   Pulse (!) 46   Ht 5\' 10"  (1.778 m)   Wt 172 lb (78 kg)   BMI 24.68 kg/m   EXAM: General appearance: alert and no distress Neck: no carotid bruit, no JVD and thyroid not enlarged, symmetric, no tenderness/mass/nodules Lungs: clear to auscultation bilaterally Heart: regular rate and rhythm, S1, S2 normal, no murmur, click, rub or gallop Abdomen: soft, non-tender; bowel sounds normal; no masses,  no organomegaly Extremities: extremities normal, atraumatic, no cyanosis or edema Pulses: 2+ and symmetric Skin: Skin color, texture, turgor normal. No rashes or lesions Neurologic: Grossly normal Psych: Pleasant  EKG: This bradycardia with marked sinus arrhythmia and first-degree AV block at 46, LAFB, QTC 369 ms-personally reviewed   ASSESSMENT: 1. 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 PVC's -suppressed with mexilitine 3. PAD status post diamondback were orbital atherectomy  and stenting of the right common iliac artery 4. Hypertension 5. Dyslipidemia 6. Mild CAD 7. Obstructive sleep apnea on CPAP 8. DOE - negative treadmill stress test 9. Tremor/memory loss/gait abnormalities - no evidence of Parkinson's by recent neurology consult  PLAN: 1.   Mr. Orrico continues to do well with a stable EKG and no recurrent PVCs that we are aware of.  He denies any chest pain or symptoms of claudication.  Blood pressure is well controlled cholesterol is been at goal.  Overall doing well.  Follow-up with me in 6 months or sooner as necessary.  Pixie Casino, MD, [facility], [place] Director of the Advanced Lipid Disorders &  Cardiovascular Risk Reduction Clinic Attending Cardiologist  Direct Dial: [phone]  Fax: [phone]  Website:  [email or web address] 09/11/2017, 10:33 AM

## 2017-11-28 ENCOUNTER — Ambulatory Visit: Payer: Medicare HMO | Admitting: Internal Medicine

## 2017-11-28 ENCOUNTER — Encounter: Payer: Self-pay | Admitting: Internal Medicine

## 2017-11-28 VITALS — BP 121/60 | HR 54 | Ht 70.0 in | Wt 175.0 lb

## 2017-11-28 DIAGNOSIS — M79604 Pain in right leg: Secondary | ICD-10-CM | POA: Diagnosis not present

## 2017-11-28 DIAGNOSIS — I739 Peripheral vascular disease, unspecified: Secondary | ICD-10-CM | POA: Diagnosis not present

## 2017-11-28 DIAGNOSIS — I2510 Atherosclerotic heart disease of native coronary artery without angina pectoris: Secondary | ICD-10-CM | POA: Diagnosis not present

## 2017-11-28 NOTE — Patient Instructions (Signed)
Medication Instructions:  Continue current medications  If you need a refill on your cardiac medications before your next appointment, please call your pharmacy.    Testing/Procedures: Your physician has requested that you have a lower extremity arterial duplex. This test is an ultrasound of the arteries in the legs. It looks at arterial blood flow in the legs. Allow one hour for Lower Arterial scans. There are no restrictions or special instructions. This is done in Dr. Lysbeth Penner office.  Follow-Up: At [facility], you and your health needs are our priority.  As part of our continuing mission to provide you with exceptional heart care, we have created designated Provider Care Teams.  These Care Teams include your primary Cardiologist (physician) and Advanced Practice Providers (APPs -  Physician Assistants and Nurse Practitioners) who all work together to provide you with the care you need, when you need it. You will need a follow up appointment in 6 months.  Please call our office 2 months in advance to schedule this appointment.  You may see Pixie Casino, MD or one of the following Advanced Practice Providers on your designated Care Team: [place], [place] . Fabian Sharp, PA-C  Any Other Special Instructions Will Be Listed Below (If Applicable).

## 2017-11-28 NOTE — Progress Notes (Signed)
OFFICE NOTE  Chief Complaint:  No complaints  Primary Care Physician: Carl Limbo, MD  HPI:  Carl Harper is a pleasant 83 year old male previously followed Carl Harper with a history of peripheral arterial disease. He underwent diamondback orbital rotational atherectomy by Carl Harper in 2013 with stenting of the calcified right common iliac stenosis. He has mild nonobstructive coronary disease by cath in 2001 and a negative Myoview in 2011. He also has obstructive sleep apnea on CPAP has had reflux symptoms and atypical chest pain from time to time.  His echocardiogram does show mild concentric LVH and borderline aortic root dilatation with a possible small ascending aortic aneurysm which will need to be followed. At his last visit with Carl Harper his Toprol was cut down to 25 mg alternating with 12.5 mg every 2 some bradycardia.   [name] was recently seen by Carl Harper in the office for shortness of breath and palpitations as well as exertional dyspnea. He wore the monitor however that was never performed as no monitors were available. He did undergo an exercise tolerance test for which he exercised for 6 minutes and 7 metabolic equivalents. There was no evidence of ischemia. He did have PVCs and a fairly flat blood pressure response to exercise. There was marked dyspnea with exertion. He also is reported recent tremors, some memory loss and instability with his gait.  Carl Harper returns today and is complaining of a sinking spell his chest. When pressed further he felt like he was possibly presyncopal occasionally has trouble getting his breath. He has some occasional dizziness. He was evaluated for monitor however none was placed because there was not a monitor available. He did have his metoprolol increased to 12.5/25 mg every other day. He seems to think that this is helped his symptoms and feels better than he had when he saw Carl Harper.  I saw Carl Harper back in the  office today. He says that he call and make an appointment about a month ago because his been feeling some episodes of chest discomfort in the morning. He's also felt somewhat short of breath. An EKG in the office today demonstrates new onset atrial flutter with ventricular bigeminy at a rate of 77. This is a new diagnosis for him. I spent greater than 10 minutes discussing atrial fibrillation and anticoagulation options with him. He understands his options and the need for trying to establish a sinus rhythm.  [name] returns today the office for follow-up. His EKG today fortunately shows sinus rhythm with PVCs. This is good news as we will not need to schedule him for a cardioversion. He has been taking Eliquis and feels that this is tolerable. He denies any bleeding problems. Here she does feel improvement in energy and I suspect this is from converting back to sinus rhythm at some point. Based on his TIA event, I suspect it may been related to paroxysmal atrial flutter. He did have carotid Dopplers which show very mild bilateral carotid disease and are not likely the source of his TIA event.  I saw [name] back today in the office. He is maintaining sinus bradycardia with first degree AV block at a rate of 52. He denies any bleeding problems on Eliquis. He is on low-dose amiodarone. This was started by Carl Harper on 12/30/14, after monitor was placed which showed a very high burden of PVCs greater than 30% as well as A. fib. A repeat echo was performed which showed preserved LV  systolic function. Carl Harper was symptomatic with his PVCs. He reports a marked reduction in his symptoms after starting the amiodarone. I do not see these had baseline pulmonary function testing, thyroid or liver function tests.  11/09/2015  [name] returns today for follow-up. He has had recent worsening shortness of breath and significant fatigue and leg weakness with exercise. This is of fairly recent finding.  He was seen by Carl Harper, who noted that recently Mr. Harper ophthalmologist had recommended discontinuing amiodarone due to corneal deposits. Since that time, he has been having a washout of the amiodarone with the plan to reassess his burden of PVCs by monitoring at the end of September. This is a ready been scheduled. His last echo was in 2016 and showed normal LV function, but he has had new symptoms as described above and would likely benefit from a repeat echo to rule out cardiomyopathy.  02/04/2016  [name] returns today for follow-up. He has been seen by Carl Harper for evaluation of his a-fib. He was on amiodarone, but we discontinued it due to corneal deposits. He has maintained sinus rhythm without recurrent atrial fibrillation. PVCs were noted and noted to be significant and he was started on mexiletine. Since that he's had some improvement in his symptoms although was noted to have some PVCs today. He has a follow-up with Carl Harper in a few months. We repeated an echocardiogram which showed normal systolic and diastolic function with mild left atrial enlargement.  08/10/2016  [name] returns today. He reports his palpitations have improved significantly with the addition of mexiletine. He is seen Carl Harper with cardiac electrophysiology for this. Blood pressure is well-controlled today 102/52. EKG which is personally reviewed shows sinus bradycardia with marked sinus arrhythmia and first-degree AV block at 53. QTC is 377 ms with a left anterior fascicular block and first-degree AV block.   02/05/2017  [name] was seen today in follow-up.  Overall is without complaints.  He denies any significant palpitations, shortness of breath, chest pain or lower extremity claudication.  He is been successfully treated on mexiletine and although he continues to have some PVCs, the frequency and burden have decreased significantly.  Blood pressure is at goal.  EKG was personally  reviewed today which is sinus rhythm, first-degree AV block and occasional PVCs, left anterior fascicular block and QTC 413 ms.  09/11/2017  [name] returns today for follow-up.  Again he is doing well without complaints.  He denies any palpitations or significant arrhythmias.  He denies any chest pain or worsening shortness of breath.  He occasionally gets some fatigue with working out in the hot weather for long periods of time.  This may be related to some low normal blood pressure.  He is using Lasix as needed.  Blood pressure today was 108/58 however he generally says that his blood pressures around 614 systolic.  11/28/2017  [name] is seen today in follow-up.  He seems to be doing well.  He denies any recurrent atrial fibrillation.  He is tolerating Eliquis without bleeding issues.  Blood pressures well controlled today 121/60.  He denies any PVCs and he is compliant with mexiletine and metoprolol.  EKG shows sinus bradycardia with first-degree AV block, nonspecific IVCD with QTC of 411 ms.  His only other concern is he has some right leg pain.  He does have a history of PAD.  He says sometimes this is worse with exertion and improves at rest but at other  times he gets a pain in his right hip when laying in bed, which is more concerning for a bursitis or possibly osteoarthritis.  PMHx:  Past Medical History:  Diagnosis Date  . Benign neoplasm of colon 10/24/2006  . BPH (benign prostatic hyperplasia)   . Daily headache   . Diverticulosis 09/03.2008  . Dysrhythmia    "irregular"  . Hypertension   . Internal hemorrhoids without mention of complication 50/35/4656  . Memory loss   . PAD (peripheral artery disease) ([place])   . Restless leg syndrome   . Sleep apnea    dx'd; wore mask; lost weight; turned in machine"    Past Surgical History:  Procedure Laterality Date  . APPENDECTOMY  ~ 1954  . ATHERECTOMY Right 11/07/2011   Procedure: ATHERECTOMY;  Surgeon: Lorretta Harp, MD;   Location: [facility] CATH LAB;  Service: Cardiovascular;  Laterality: Right;  . CARDIAC CATHETERIZATION  03/30/1999   non-critical CAD  . CATARACT EXTRACTION W/ INTRAOCULAR LENS  IMPLANT, BILATERAL  2011-2012  . ILIAC ARTERY STENT Right 11/07/2011   Diamondback orbital rotational atherectomy, PTA & stenting of calcified R CIA (Dr. Adora Fridge)  . LOWER EXTREMITY ANGIOGRAM N/A 11/07/2011   Procedure: LOWER EXTREMITY ANGIOGRAM;  Surgeon: Lorretta Harp, MD;  Location: [facility] CATH LAB;  Service: Cardiovascular;  Laterality: N/A;  . NM MYOCAR PERF WALL MOTION  09/2012   lexiscan myoview - low risk with fixed inferior defect with underlying bowel attenuation suggestive of artifact  . PERCUTANEOUS STENT INTERVENTION  11/07/2011   Procedure: PERCUTANEOUS STENT INTERVENTION;  Surgeon: Lorretta Harp, MD;  Location: [facility] CATH LAB;  Service: Cardiovascular;;  . SHOULDER ARTHROSCOPY W/ ROTATOR CUFF REPAIR Left 2010  . Sleep Study  01/09/2011   AHI during total sleep 14.4/hr and during REM 19.4/hr  . TRANSTHORACIC ECHOCARDIOGRAM  09/2012   EF 55-60%, LA mildly dilated    FAMHx:  Family History  Problem Relation Age of Onset  . Heart Problems Father 97  . Cancer Brother 31  . Heart failure Brother   . Cancer Maternal Grandfather        prostate    SOCHx:   reports that he quit smoking about 11 years ago. His smoking use included cigarettes. He has a 55.00 pack-year smoking history. He has never used smokeless tobacco. He reports that he drinks about 14.0 standard drinks of alcohol per week. He reports that he does not use drugs.  ALLERGIES:  Allergies  Allergen Reactions  . Amiodarone Other (See Comments)    Other reaction(s): Other (See Comments) Corneal deposits Corneal deposits  . Bee Venom Other (See Comments)    unknown    ROS: Pertinent items noted in HPI and remainder of comprehensive ROS otherwise negative.  HOME MEDS: Current Outpatient Medications  Medication Sig Dispense Refill  .  apixaban (ELIQUIS) 5 MG TABS tablet Take 1 tablet (5 mg total) by mouth 2 (two) times daily. 180 tablet 3  . aspirin 81 MG tablet Take 81 mg by mouth daily.    Marland Kitchen BIOTIN PO Take by mouth.    . EVENING PRIMROSE OIL PO Take 1,300 mg by mouth daily.    . finasteride (PROSCAR) 5 MG tablet Take 5 mg by mouth daily.    . furosemide (LASIX) 20 MG tablet Take 1 tablet (20 mg total) by mouth daily. (Patient taking differently: Take 20 mg by mouth as needed. ) 90 tablet 3  . HYDROcodone-acetaminophen (NORCO) 10-325 MG tablet Take by mouth.     Marland Kitchen  losartan (COZAAR) 25 MG tablet Take 1 tablet (25 mg total) by mouth daily. 90 tablet 3  . LUTEIN PO Take by mouth.    . Magnesium 200 MG TABS Take 1 tablet (200 mg total) by mouth daily. 30 each 4  . metoprolol succinate (TOPROL-XL) 25 MG 24 hr tablet Take 1 tablet (25 mg total) by mouth daily. 90 tablet 3  . mexiletine (MEXITIL) 150 MG capsule TAKE ONE CAPSULE BY MOUTH TWICE A DAY 60 capsule 6  . nitroGLYCERIN (NITROSTAT) 0.4 MG SL tablet Place 0.4 mg under the tongue every 5 (five) minutes as needed for chest pain.    . pramipexole (MIRAPEX) 0.75 MG tablet Take by mouth.    . RESTASIS 0.05 % ophthalmic emulsion Apply 1 drop to eye 2 (two) times daily.    . Tamsulosin HCl (FLOMAX) 0.4 MG CAPS Take 0.4 mg by mouth daily after supper.    . traZODone (DESYREL) 100 MG tablet Take 100 mg by mouth at bedtime.  3  . vitamin B-12 (CYANOCOBALAMIN) 100 MCG tablet Take by mouth.     No current facility-administered medications for this visit.     LABS/IMAGING: No results Harper for this or any previous visit (from the past 48 hour(s)). No results Harper.  VITALS: BP 121/60   Pulse (!) 54   Ht 5\' 10"  (1.778 m)   Wt 175 lb (79.4 kg)   BMI 25.11 kg/m   EXAM: General appearance: alert and no distress Neck: no carotid bruit, no JVD and thyroid not enlarged, symmetric, no tenderness/mass/nodules Lungs: clear to auscultation bilaterally Heart: regular rate and  rhythm, S1, S2 normal, no murmur, click, rub or gallop Abdomen: soft, non-tender; bowel sounds normal; no masses,  no organomegaly Extremities: extremities normal, atraumatic, no cyanosis or edema Pulses: 2+ and symmetric Skin: Skin color, texture, turgor normal. No rashes or lesions Neurologic: Grossly normal Psych: Pleasant  EKG: Sinus bradycardia with marked sinus arrhythmia, first-degree AV block, left axis deviation, nonspecific IVCD at 54, QTc 411 ms-personally reviewed  ASSESSMENT: 1. 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 PVC's -suppressed with mexilitine 3. PAD status post diamondback were orbital atherectomy and stenting of the right common iliac artery 4. Hypertension 5. Dyslipidemia 6. Mild CAD 7. Obstructive sleep apnea on CPAP 8. DOE - negative treadmill stress test 9. Tremor/memory loss/gait abnormalities - no evidence of Parkinson's by recent neurology consult  PLAN: 1.   [name] denies any significant recurrent atrial flutter or PVCs which are well controlled now on mexiletine.  He has a history of PAD and is complaining of some right leg discomfort.  He had orbital atherectomy and stenting of the right common iliac in the past.  We will go ahead and reassess lower extreme the arterial Dopplers which were last performed in 2016 and showed normal ABIs.  Follow-up with me afterwards.  Pixie Casino, MD, [facility], [place] Director of the [facility] Lip[facility]  [facility] Attending Cardiologist  Direct Dial: [phone]  Fax: [phone]  Website:  [email or web address] 11/28/2017, 10:22 AM

## 2017-12-03 ENCOUNTER — Other Ambulatory Visit: Payer: Self-pay | Admitting: Internal Medicine

## 2017-12-03 DIAGNOSIS — M79604 Pain in right leg: Secondary | ICD-10-CM

## 2017-12-03 DIAGNOSIS — I739 Peripheral vascular disease, unspecified: Secondary | ICD-10-CM

## 2017-12-07 ENCOUNTER — Ambulatory Visit (HOSPITAL_COMMUNITY)
Admission: RE | Admit: 2017-12-07 | Discharge: 2017-12-07 | Disposition: A | Discharge status: Home / Self Care | Payer: Medicare HMO | Source: Ambulatory Visit | Attending: Cardiovascular Disease | Admitting: Cardiovascular Disease

## 2017-12-07 ENCOUNTER — Ambulatory Visit (HOSPITAL_BASED_OUTPATIENT_CLINIC_OR_DEPARTMENT_OTHER)
Admission: RE | Admit: 2017-12-07 | Discharge: 2017-12-07 | Disposition: A | Discharge status: Home / Self Care | Payer: Medicare HMO | Source: Ambulatory Visit | Attending: Internal Medicine | Admitting: Internal Medicine

## 2017-12-07 DIAGNOSIS — M79604 Pain in right leg: Secondary | ICD-10-CM

## 2017-12-07 DIAGNOSIS — I739 Peripheral vascular disease, unspecified: Secondary | ICD-10-CM

## 2017-12-10 ENCOUNTER — Other Ambulatory Visit: Payer: Self-pay | Admitting: *Deleted

## 2017-12-10 DIAGNOSIS — I771 Stricture of artery: Secondary | ICD-10-CM

## 2017-12-10 DIAGNOSIS — I739 Peripheral vascular disease, unspecified: Secondary | ICD-10-CM

## 2017-12-13 ENCOUNTER — Other Ambulatory Visit: Payer: Self-pay | Admitting: Cardiology

## 2018-01-09 ENCOUNTER — Other Ambulatory Visit: Payer: Self-pay | Admitting: Cardiology

## 2018-02-02 ENCOUNTER — Other Ambulatory Visit: Payer: Self-pay | Admitting: Cardiology

## 2018-02-02 ENCOUNTER — Other Ambulatory Visit: Payer: Self-pay | Admitting: Internal Medicine

## 2018-02-06 ENCOUNTER — Ambulatory Visit: Payer: Medicare HMO | Attending: Family Medicine | Admitting: Physical Therapy

## 2018-02-06 ENCOUNTER — Other Ambulatory Visit: Payer: Self-pay

## 2018-02-06 ENCOUNTER — Encounter: Payer: Self-pay | Admitting: Physical Therapy

## 2018-02-06 DIAGNOSIS — R262 Difficulty in walking, not elsewhere classified: Secondary | ICD-10-CM | POA: Insufficient documentation

## 2018-02-06 DIAGNOSIS — R2681 Unsteadiness on feet: Secondary | ICD-10-CM | POA: Insufficient documentation

## 2018-02-06 DIAGNOSIS — M6281 Muscle weakness (generalized): Secondary | ICD-10-CM | POA: Insufficient documentation

## 2018-02-06 NOTE — Therapy (Signed)
[place] [place], [place], [address] Phone: [phone]   Fax:  [phone]  Physical Therapy Evaluation  Patient Details  Name: Carl Harper MRN: [id number] Date of Birth: 03/16/1934 Referring Provider (PT): Bouska   Encounter Date: 02/06/2018  PT End of Session - 02/06/18 1044    Visit Number  1    Date for PT Re-Evaluation  04/09/18    PT Start Time  1014    PT Stop Time  1055    PT Time Calculation (min)  41 min    Activity Tolerance  Patient tolerated treatment well    Behavior During Therapy  [facility] for tasks assessed/performed       Past Medical History:  Diagnosis Date  . Benign neoplasm of colon 10/24/2006  . BPH (benign prostatic hyperplasia)   . Daily headache   . Diverticulosis 09/03.2008  . Dysrhythmia    "irregular"  . Hypertension   . Internal hemorrhoids without mention of complication 69/62/9528  . Memory loss   . PAD (peripheral artery disease) ([place])   . Restless leg syndrome   . Sleep apnea    dx'd; wore mask; lost weight; turned in machine"    Past Surgical History:  Procedure Laterality Date  . APPENDECTOMY  ~ 1954  . ATHERECTOMY Right 11/07/2011   Procedure: ATHERECTOMY;  Surgeon: Lorretta Harp, MD;  Location: [facility] CATH LAB;  Service: Cardiovascular;  Laterality: Right;  . CARDIAC CATHETERIZATION  03/30/1999   non-critical CAD  . CATARACT EXTRACTION W/ INTRAOCULAR LENS  IMPLANT, BILATERAL  2011-2012  . ILIAC ARTERY STENT Right 11/07/2011   Diamondback orbital rotational atherectomy, PTA & stenting of calcified R CIA (Dr. Adora Fridge)  . LOWER EXTREMITY ANGIOGRAM N/A 11/07/2011   Procedure: LOWER EXTREMITY ANGIOGRAM;  Surgeon: Lorretta Harp, MD;  Location: [facility] CATH LAB;  Service: Cardiovascular;  Laterality: N/A;  . NM MYOCAR PERF WALL MOTION  09/2012   lexiscan myoview - low risk with fixed inferior defect with underlying bowel attenuation suggestive of artifact  .  PERCUTANEOUS STENT INTERVENTION  11/07/2011   Procedure: PERCUTANEOUS STENT INTERVENTION;  Surgeon: Lorretta Harp, MD;  Location: [facility] CATH LAB;  Service: Cardiovascular;;  . SHOULDER ARTHROSCOPY W/ ROTATOR CUFF REPAIR Left 2010  . Sleep Study  01/09/2011   AHI during total sleep 14.4/hr and during REM 19.4/hr  . TRANSTHORACIC ECHOCARDIOGRAM  09/2012   EF 55-60%, LA mildly dilated    There were no vitals filed for this visit.   Subjective Assessment - 02/06/18 1013    Subjective  Patient reports that he has had multiple stumbles over the past few years and "definitely worser this year".      Limitations  Walking    Patient Stated Goals  feel stronger and more stable when walking    Currently in Pain?  Yes    Pain Score  7     Pain Location  Back    Pain Orientation  Lower    Pain Descriptors / Indicators  Aching    Pain Type  Chronic pain    Pain Radiating Towards  some right leg symptoms    Pain Onset  More than a month ago    Pain Frequency  Intermittent    Aggravating Factors   being sedentary    Pain Relieving Factors  rest, take pain meds, strech, pain can go down to 1-2/10    Effect of Pain on Daily Activities  limits  activity due to pain         [facility] PT Assessment - 02/06/18 0001      Assessment   Medical Diagnosis  debility, gait instability    Referring Provider (PT)  Bouska    Onset Date/Surgical Date  01/07/18    Prior Therapy  almost 3 years ago      Balance Screen   Has the patient fallen in the past 6 months  Yes    How many times?  1, reports he has had numerous stumbles    Has the patient had a decrease in activity level because of a fear of falling?   No    Is the patient reluctant to leave their home because of a fear of falling?   No      Home Environment   Additional Comments  does yard and housework      Prior Function   Level of Independence  Independent    Leisure  not doing any exercises, no walking, reports doing some stretches       Posture/Postural Control   Posture Comments  fwd head, rounded shoulders, decreased lordosis      AROM   Overall AROM Comments  LE's WFL's, Lumbar ROM decreased 75% wtih some tightness and pain in the low back      Strength   Overall Strength Comments  Hips 4/5, knees 4/5, ankles 4+/5      Flexibility   Soft Tissue Assessment /Muscle Length  yes    Hamstrings  tightness    Piriformis  tightness      Berg Balance Test   Sit to Stand  Able to stand without using hands and stabilize independently    Standing Unsupported  Able to stand safely 2 minutes    Sitting with Back Unsupported but Feet Supported on Floor or Stool  Able to sit safely and securely 2 minutes    Stand to Sit  Sits safely with minimal use of hands    Transfers  Able to transfer safely, definite need of hands    Standing Unsupported with Eyes Closed  Able to stand 10 seconds safely    Standing Ubsupported with Feet Together  Able to place feet together independently and stand for 1 minute with supervision    From Standing, Reach Forward with Outstretched Arm  Can reach forward >12 cm safely (5")    From Standing Position, Pick up Object from Floor  Able to pick up shoe, needs supervision    From Standing Position, Turn to Look Behind Over each Shoulder  Turn sideways only but maintains balance    Turn 360 Degrees  Able to turn 360 degrees safely one side only in 4 seconds or less    Standing Unsupported, Alternately Place Feet on Step/Stool  Able to stand independently and complete 8 steps >20 seconds    Standing Unsupported, One Foot in Front  Able to take small step independently and hold 30 seconds    Standing on One Leg  Able to lift leg independently and hold equal to or more than 3 seconds    Total Score  44      Timed Up and Go Test   Normal TUG (seconds)  21                Objective measurements completed on examination: See above findings.      [facility] Adult PT Treatment/Exercise - 02/06/18 0001       Knee/Hip Exercises: Aerobic  Nustep  level 3 x 6 minutes             PT Education - 02/06/18 1044    Education provided  Yes    Education Details  HEP four way kicks and side stepping all while holding onto something    Person(s) Educated  Patient    Methods  Explanation;Demonstration;Handout    Comprehension  Verbalized understanding       PT Short Term Goals - 02/06/18 1230      PT SHORT TERM GOAL #1   Title  independent with initial HEP    Time  2    Period  Weeks    Status  New        PT Long Term Goals - 02/06/18 1231      PT LONG TERM GOAL #1   Title  decrease timed up and go test to 12 seconds    Time  8    Period  Weeks    Status  New      PT LONG TERM GOAL #2   Title  increase berg balance test score to 50/56    Time  8    Period  Weeks    Status  New      PT LONG TERM GOAL #3   Title  return to walking 1 miles for exercise    Time  8    Period  Weeks    Status  New      PT LONG TERM GOAL #4   Title  walk on uneven surfaces without loss of balance or without reports of feeling fearful    Time  8    Period  Weeks    Status  New             Plan - 02/06/18 1045    Clinical Impression Statement  Patient was seen here almost 3 years ago for balance and fatigue issues, he returns today reporting that he has stopped doing any exercises and avoids stairs due to fear of falling, her reports about 20 stumbles, and one fall in the yard over the past 6 months.  His TUG time increased from 8 seconds to 21 seconds, his Berg balance score decreased from 55/56 to 44/56 showing a decreased in function and an increased risk for falls    Clinical Presentation  Stable    Clinical Decision Making  Low    Rehab Potential  Good    PT Frequency  2x / week    PT Duration  8 weeks    PT Treatment/Interventions  ADLs/Self Care Home Management;Functional mobility training;Gait training;Therapeutic activities;Therapeutic exercise;Manual  techniques;Patient/family education;Balance training;Neuromuscular re-education;Passive range of motion    PT Next Visit Plan  Start strength, balance    Consulted and Agree with Plan of Care  Patient       Patient will benefit from skilled therapeutic intervention in order to improve the following deficits and impairments:  Abnormal gait, Decreased activity tolerance, Decreased mobility, Decreased range of motion, Decreased strength, Difficulty walking, Impaired flexibility, Pain, Decreased balance, Decreased endurance, Postural dysfunction  Visit Diagnosis: Unsteadiness on feet - Plan: PT plan of care cert/re-cert  Muscle weakness (generalized) - Plan: PT plan of care cert/re-cert  Difficulty in walking, not elsewhere classified - Plan: PT plan of care cert/re-cert     Problem List Patient Active Problem List   Diagnosis Date Noted  . Coronary artery disease involving native coronary artery of native heart without angina pectoris 02/05/2017  .  Palpitations 08/10/2016  . Paroxysmal atrial fibrillation ([place]) 08/10/2016  . Weakness of both lower extremities 11/09/2015  . Atrial flutter ([place]) 10/28/2014  . DOE (dyspnea on exertion) 10/10/2013  . PVC's (premature ventricular contractions) 09/26/2013  . OSA on CPAP 03/31/2013  . Hx of adenomatous colonic polyps 02/28/2012  . PAD (peripheral artery disease) ([place]) 11/08/2011  . HTN (hypertension) 11/08/2011  . Claudication [facility]) 11/08/2011    Sumner Boast., PT 02/06/2018, 12:33 PM  [address] [place], [place], [address] Phone: [phone]   Fax:  [phone]  Name: Carl Harper MRN: [id number] Date of Birth: [birth year]

## 2018-02-19 ENCOUNTER — Ambulatory Visit: Payer: Medicare HMO | Admitting: Physical Therapy

## 2018-03-01 ENCOUNTER — Other Ambulatory Visit: Payer: Self-pay | Admitting: Cardiology

## 2018-03-06 ENCOUNTER — Ambulatory Visit: Payer: Medicare HMO | Attending: Family Medicine | Admitting: Physical Therapy

## 2018-03-06 ENCOUNTER — Encounter: Payer: Self-pay | Admitting: Physical Therapy

## 2018-03-06 DIAGNOSIS — R2681 Unsteadiness on feet: Secondary | ICD-10-CM | POA: Diagnosis not present

## 2018-03-06 DIAGNOSIS — M6281 Muscle weakness (generalized): Secondary | ICD-10-CM | POA: Insufficient documentation

## 2018-03-06 DIAGNOSIS — R262 Difficulty in walking, not elsewhere classified: Secondary | ICD-10-CM

## 2018-03-06 NOTE — Therapy (Signed)
[place], [place], [address] Phone: [phone]   Fax:  [phone]  Physical Therapy Treatment  Patient Details  Name: Carl Harper MRN: [id number] Date of Birth: 1934/10/17 Referring Provider (PT): B[place]   Encounter Date: 03/06/2018  PT End of Session - 03/06/18 1348    Visit Number  2    Date for PT Re-Evaluation  04/09/18    PT Start Time  1306    PT Stop Time  1349    PT Time Calculation (min)  43 min    Activity Tolerance  Patient tolerated treatment well    Behavior During Therapy  [facility] for tasks assessed/performed       Past Medical History:  Diagnosis Date  . Benign neoplasm of colon 10/24/2006  . BPH (benign prostatic hyperplasia)   . Daily headache   . Diverticulosis 09/03.2008  . Dysrhythmia    "irregular"  . Hypertension   . Internal hemorrhoids without mention of complication 38/45/3646  . Memory loss   . PAD (peripheral artery disease) ([place])   . Restless leg syndrome   . Sleep apnea    dx'd; wore mask; lost weight; turned in machine"    Past Surgical History:  Procedure Laterality Date  . APPENDECTOMY  ~ 1954  . ATHERECTOMY Right 11/07/2011   Procedure: ATHERECTOMY;  Surgeon: Lorretta Harp, MD;  Location: [facility] C[facility] LAB;  Service: Cardiovascular;  Laterality: Right;  . CARDIAC CATHETERIZATION  03/30/1999   non-critical CAD  . CATARACT EXTRACTION W/ INTRAOCULAR LENS  IMPLANT, BILATERAL  2011-2012  . ILIAC ARTERY STENT Right 11/07/2011   Diamondback orbital rotational atherectomy, PTA & stenting of calcified R CIA (Dr. Adora Fridge)  . LOWER EXTREMITY ANGIOGRAM N/A 11/07/2011   Procedure: LOWER EXTREMITY ANGIOGRAM;  Surgeon: Lorretta Harp, MD;  Location: [facility] CATH LAB;  Service: Cardiovascular;  Laterality: N/A;  . NM MYOCAR PERF WALL MOTION  09/2012   lexiscan myoview - low risk with fixed inferior defect with underlying bowel attenuation suggestive of artifact  .  PERCUTANEOUS STENT INTERVENTION  11/07/2011   Procedure: PERCUTANEOUS STENT INTERVENTION;  Surgeon: Lorretta Harp, MD;  Location: [facility] CATH LAB;  Service: Cardiovascular;;  . SHOULDER ARTHROSCOPY W/ ROTATOR CUFF REPAIR Left 2010  . Sleep Study  01/09/2011   AHI during total sleep 14.4/hr and during REM 19.4/hr  . TRANSTHORACIC ECHOCARDIOGRAM  09/2012   EF 55-60%, LA mildly dilated    There were no vitals filed for this visit.  Subjective Assessment - 03/06/18 1308    Subjective  No reports of new falls or stumbels    Currently in Pain?  Yes    Pain Score  7     Pain Location  Back    Pain Orientation  Lower                       [facility] Adult PT Treatment/Exercise - 03/06/18 0001      Ambulation/Gait   Gait Comments  gait down stairs one hand rali, step over step, then outside up a slope      High Level Balance   High Level Balance Activities  Side stepping;Backward walking;Tandem walking;Negotiating over obstacles    High Level Balance Comments  walking and ball tossing, side step with ball toss, on airex ball toss      Lumbar Exercises: Machines for Strengthening   Cybex Knee Extension  5# 2x10    Cybex  Knee Flexion  25# 2x10      Knee/Hip Exercises: Aerobic   Nustep  level 3 x 6 minutes               PT Short Term Goals - 03/06/18 1351      PT SHORT TERM GOAL #1   Title  independent with initial HEP    Status  Partially Met        PT Long Term Goals - 02/06/18 1231      PT LONG TERM GOAL #1   Title  decrease timed up and go test to 12 seconds    Time  8    Period  Weeks    Status  New      PT LONG TERM GOAL #2   Title  increase berg balance test score to 50/56    Time  8    Period  Weeks    Status  New      PT LONG TERM GOAL #3   Title  return to walking 1 miles for exercise    Time  8    Period  Weeks    Status  New      PT LONG TERM GOAL #4   Title  walk on uneven surfaces without loss of balance or without reports of  feeling fearful    Time  8    Period  Weeks    Status  New            Plan - 03/06/18 1349    Clinical Impression Statement  Patient did well with the exercise and balance, with the balance he needed sue to do things correctly, he tended to cheat and turn hips and would circumduct around obstacle if he did not get cues.  He was fatigued at the end of treatment    PT Next Visit Plan  add as tolerated, strength and balance    Consulted and Agree with Plan of Care  Patient       Patient will benefit from skilled therapeutic intervention in order to improve the following deficits and impairments:  Abnormal gait, Decreased activity tolerance, Decreased mobility, Decreased range of motion, Decreased strength, Difficulty walking, Impaired flexibility, Pain, Decreased balance, Decreased endurance, Postural dysfunction  Visit Diagnosis: Unsteadiness on feet  Muscle weakness (generalized)  Difficulty in walking, not elsewhere classified     Problem List Patient Active Problem List   Diagnosis Date Noted  . Coronary artery disease involving native coronary artery of native heart without angina pectoris 02/05/2017  . Palpitations 08/10/2016  . Paroxysmal atrial fibrillation ([place]) 08/10/2016  . Weakness of both lower extremities 11/09/2015  . Atrial flutter ([place]) 10/28/2014  . DOE (dyspnea on exertion) 10/10/2013  . PVC's (premature ventricular contractions) 09/26/2013  . OSA on CPAP 03/31/2013  . Hx of adenomatous colonic polyps 02/28/2012  . PAD (peripheral artery disease) ([place]) 11/08/2011  . HTN (hypertension) 11/08/2011  . Claudication [facility]) 11/08/2011    Carl Harper., PT 03/06/2018, 1:52 PM  [place], [place], [address] Phone: [phone]   Fax:  [phone]  Name: Carl Harper MRN: [id number] Date of Birth: [birth year]

## 2018-03-12 ENCOUNTER — Other Ambulatory Visit: Payer: Self-pay

## 2018-03-12 MED ORDER — MEXILETINE HCL 150 MG PO CAPS: 150.0000 mg | ORAL_CAPSULE | Freq: Two times a day (BID) | ORAL | 0 refills | Status: DC

## 2018-03-12 MED ORDER — MEXILETINE HCL 150 MG PO CAPS: ORAL_CAPSULE | ORAL | 0 refills | Status: DC

## 2018-03-12 NOTE — Addendum Note (Signed)
Addended by: Gar Ponto on: 03/12/2018 11:22 AM   Modules accepted: Orders

## 2018-03-13 ENCOUNTER — Encounter: Payer: Self-pay | Admitting: Physical Therapy

## 2018-03-13 ENCOUNTER — Ambulatory Visit: Payer: Medicare HMO | Admitting: Physical Therapy

## 2018-03-13 ENCOUNTER — Other Ambulatory Visit: Payer: Self-pay | Admitting: Cardiology

## 2018-03-13 DIAGNOSIS — R2681 Unsteadiness on feet: Secondary | ICD-10-CM | POA: Diagnosis not present

## 2018-03-13 DIAGNOSIS — R262 Difficulty in walking, not elsewhere classified: Secondary | ICD-10-CM

## 2018-03-13 DIAGNOSIS — M6281 Muscle weakness (generalized): Secondary | ICD-10-CM

## 2018-03-13 MED ORDER — MEXILETINE HCL 150 MG PO CAPS: ORAL_CAPSULE | ORAL | 0 refills | Status: DC

## 2018-03-13 NOTE — Telephone Encounter (Signed)
° ° ° °*  STAT* If patient is at the pharmacy, call can be transferred to refill team.   1. Which medications need to be refilled? (please list name of each medication and dose if known) mexiletine (MEXITIL) 150 MG capsule  2. Which pharmacy/location (including street and city if local pharmacy) is medication to be sent to [facility] at [place], [place]  3. Do they need a 30 day or 90 day supply? [place]

## 2018-03-13 NOTE — Telephone Encounter (Signed)
Pt's medication was sent to pt's pharmacy as requested. Confirmation received.  °

## 2018-03-13 NOTE — Addendum Note (Signed)
Addended by: Gar Ponto on: 03/13/2018 12:12 PM   Modules accepted: Orders

## 2018-03-13 NOTE — Therapy (Signed)
[place], [place], [address] Phone: [phone]   Fax:  [phone]  Physical Therapy Treatment  Patient Details  Name: Carl Harper MRN: [id number] Date of Birth: Aug 02, 1934 Referring Provider (PT): Coletta Memos   Encounter Date: 03/13/2018  PT End of Session - 03/13/18 1552    Visit Number  3    Date for PT Re-Evaluation  04/09/18    PT Start Time  0300    PT Stop Time  1555    PT Time Calculation (min)  40 min    Activity Tolerance  Patient tolerated treatment well    Behavior During Therapy  [facility] for tasks assessed/performed       Past Medical History:  Diagnosis Date  . Benign neoplasm of colon 10/24/2006  . BPH (benign prostatic hyperplasia)   . Daily headache   . Diverticulosis 09/03.2008  . Dysrhythmia    "irregular"  . Hypertension   . Internal hemorrhoids without mention of complication 92/33/0076  . Memory loss   . PAD (peripheral artery disease) ([place])   . Restless leg syndrome   . Sleep apnea    dx'd; wore mask; lost weight; turned in machine"    Past Surgical History:  Procedure Laterality Date  . APPENDECTOMY  ~ 1954  . ATHERECTOMY Right 11/07/2011   Procedure: ATHERECTOMY;  Surgeon: Lorretta Harp, MD;  Location: [facility] CATH LAB;  Service: Cardiovascular;  Laterality: Right;  . CARDIAC CATHETERIZATION  03/30/1999   non-critical CAD  . CATARACT EXTRACTION W/ INTRAOCULAR LENS  IMPLANT, BILATERAL  2011-2012  . ILIAC ARTERY STENT Right 11/07/2011   Diamondback orbital rotational atherectomy, PTA & stenting of calcified R CIA (Dr. Adora Fridge)  . LOWER EXTREMITY ANGIOGRAM N/A 11/07/2011   Procedure: LOWER EXTREMITY ANGIOGRAM;  Surgeon: Lorretta Harp, MD;  Location: [facility] CATH LAB;  Service: Cardiovascular;  Laterality: N/A;  . NM MYOCAR PERF WALL MOTION  09/2012   lexiscan myoview - low risk with fixed inferior defect with underlying bowel attenuation suggestive of artifact  .  PERCUTANEOUS STENT INTERVENTION  11/07/2011   Procedure: PERCUTANEOUS STENT INTERVENTION;  Surgeon: Lorretta Harp, MD;  Location: [facility] CATH LAB;  Service: Cardiovascular;;  . SHOULDER ARTHROSCOPY W/ ROTATOR CUFF REPAIR Left 2010  . Sleep Study  01/09/2011   AHI during total sleep 14.4/hr and during REM 19.4/hr  . TRANSTHORACIC ECHOCARDIOGRAM  09/2012   EF 55-60%, LA mildly dilated    There were no vitals filed for this visit.  Subjective Assessment - 03/13/18 1518    Subjective  "pretty good" no falls or stumbles    Currently in Pain?  No/denies         [facility] PT Assessment - 03/13/18 0001      Timed Up and Go Test   Normal TUG (seconds)  11.03                   [facility] Adult PT Treatment/Exercise - 03/13/18 0001      Ambulation/Gait   Gait Comments  2 flights of stairs 1 rail alt pattern      High Level Balance   High Level Balance Activities  Side stepping;Backward walking;Tandem walking;Negotiating over obstacles    High Level Balance Comments  side stepping over foam rolls       Lumbar Exercises: Machines for Strengthening   Cybex Knee Extension  5# 2x10    Cybex Knee Flexion  25# 2x15  Knee/Hip Exercises: Aerobic   Nustep  level  x 6 minutes               PT Short Term Goals - 03/06/18 1351      PT SHORT TERM GOAL #1   Title  independent with initial HEP    Status  Partially Met        PT Long Term Goals - 03/13/18 1559      PT LONG TERM GOAL #1   Title  decrease timed up and go test to 12 seconds    Status  Achieved            Plan - 03/13/18 1553    Clinical Impression Statement  Pt has progressed meeting his TUG goal. Pt did well with balance activities. L hip tends to externally rotate descending stairs. Some LOB when asked to step over foam rolls with alternating pattern. No issues with tie increase reps on HS curls. Pt was unable to coordinate side stepping over more than one foam roll.      Rehab Potential  Good    PT  Frequency  2x / week    PT Duration  8 weeks    PT Treatment/Interventions  ADLs/Self Care Home Management;Functional mobility training;Gait training;Therapeutic activities;Therapeutic exercise;Manual techniques;Patient/family education;Balance training;Neuromuscular re-education;Passive range of motion    PT Next Visit Plan  add as tolerated, strength and balance       Patient will benefit from skilled therapeutic intervention in order to improve the following deficits and impairments:  Abnormal gait, Decreased activity tolerance, Decreased mobility, Decreased range of motion, Decreased strength, Difficulty walking, Impaired flexibility, Pain, Decreased balance, Decreased endurance, Postural dysfunction  Visit Diagnosis: Unsteadiness on feet  Muscle weakness (generalized)  Difficulty in walking, not elsewhere classified     Problem List Patient Active Problem List   Diagnosis Date Noted  . Coronary artery disease involving native coronary artery of native heart without angina pectoris 02/05/2017  . Palpitations 08/10/2016  . Paroxysmal atrial fibrillation ([place]) 08/10/2016  . Weakness of both lower extremities 11/09/2015  . Atrial flutter ([place]) 10/28/2014  . DOE (dyspnea on exertion) 10/10/2013  . PVC's (premature ventricular contractions) 09/26/2013  . OSA on CPAP 03/31/2013  . Hx of adenomatous colonic polyps 02/28/2012  . PAD (peripheral artery disease) ([place]) 11/08/2011  . HTN (hypertension) 11/08/2011  . Claudication [facility]) 11/08/2011    Scot Jun, PTA 03/13/2018, 4:00 PM  [place], [place], [address] Phone: [phone]   Fax:  [phone]  Name: Carl Harper MRN: [id number] Date of Birth: May 07, 1934

## 2018-03-15 ENCOUNTER — Telehealth: Payer: Self-pay

## 2018-03-15 ENCOUNTER — Encounter: Payer: Self-pay | Admitting: Nurse Practitioner

## 2018-03-15 NOTE — Telephone Encounter (Signed)
**Note De-Identified Carl Harper Obfuscation** I have done a Mexiletine PA through covermymeds. Key: [id number]

## 2018-03-15 NOTE — Telephone Encounter (Signed)
Mexilietine is approved 02/18/2018 through 02/20/2019  Referral Number: [id number]

## 2018-03-21 ENCOUNTER — Ambulatory Visit: Payer: Medicare HMO | Admitting: Physical Therapy

## 2018-03-22 ENCOUNTER — Telehealth: Payer: Self-pay | Admitting: Internal Medicine

## 2018-03-22 NOTE — Telephone Encounter (Signed)
Routed to Dr. Macky Lower nurse as this MD prescribed and refills this medication

## 2018-03-22 NOTE — Telephone Encounter (Signed)
New message   Pt c/o medication issue:  1. Name of Medication: mexiletine (MEXITIL) 150 MG capsule  2. How are you currently taking this medication (dosage and times per day)? Twice daily  3. Are you having a reaction (difficulty breathing--STAT)? No   4. What is your medication issue? Patient states that [employer] will no longer pay for this medication. Patient wants to see if he can be switched to another medication.

## 2018-03-25 ENCOUNTER — Ambulatory Visit: Payer: Medicare HMO | Attending: Family Medicine | Admitting: Physical Therapy

## 2018-03-25 ENCOUNTER — Encounter: Payer: Self-pay | Admitting: Physical Therapy

## 2018-03-25 DIAGNOSIS — R262 Difficulty in walking, not elsewhere classified: Secondary | ICD-10-CM

## 2018-03-25 DIAGNOSIS — M6281 Muscle weakness (generalized): Secondary | ICD-10-CM | POA: Insufficient documentation

## 2018-03-25 DIAGNOSIS — R2681 Unsteadiness on feet: Secondary | ICD-10-CM

## 2018-03-25 NOTE — Telephone Encounter (Signed)
Advised to call insurance and find out preferred antiarrythmic's under his plan. Pt agreed to call me once he has the information.

## 2018-03-25 NOTE — Therapy (Signed)
[place], [place], [address] Phone: [phone]   Fax:  [phone]  Physical Therapy Treatment  Patient Details  Name: Carl Harper MRN: [id number] Date of Birth: 02-28-1934 Referring Provider (PT): Coletta Memos   Encounter Date: 03/25/2018  PT End of Session - 03/25/18 1545    Visit Number  4    Date for PT Re-Evaluation  04/09/18    PT Start Time  6659    PT Stop Time  1555    PT Time Calculation (min)  40 min    Activity Tolerance  Patient tolerated treatment well    Behavior During Therapy  [facility] for tasks assessed/performed       Past Medical History:  Diagnosis Date  . Benign neoplasm of colon 10/24/2006  . BPH (benign prostatic hyperplasia)   . Daily headache   . Diverticulosis 09/03.2008  . Dysrhythmia    "irregular"  . Hypertension   . Internal hemorrhoids without mention of complication 93/57/0177  . Memory loss   . PAD (peripheral artery disease) ([place])   . Restless leg syndrome   . Sleep apnea    dx'd; wore mask; lost weight; turned in machine"    Past Surgical History:  Procedure Laterality Date  . APPENDECTOMY  ~ 1954  . ATHERECTOMY Right 11/07/2011   Procedure: ATHERECTOMY;  Surgeon: Lorretta Harp, MD;  Location: [facility] CATH LAB;  Service: Cardiovascular;  Laterality: Right;  . CARDIAC CATHETERIZATION  03/30/1999   non-critical CAD  . CATARACT EXTRACTION W/ INTRAOCULAR LENS  IMPLANT, BILATERAL  2011-2012  . ILIAC ARTERY STENT Right 11/07/2011   Diamondback orbital rotational atherectomy, PTA & stenting of calcified R CIA (Dr. Adora Fridge)  . LOWER EXTREMITY ANGIOGRAM N/A 11/07/2011   Procedure: LOWER EXTREMITY ANGIOGRAM;  Surgeon: Lorretta Harp, MD;  Location: [facility] C[facility] LAB;  Service: Cardiovascular;  Laterality: N/A;  . NM MYOCAR PERF WALL MOTION  09/2012   lexiscan myoview - low risk with fixed inferior defect with underlying bowel attenuation suggestive of artifact  .  PERCUTANEOUS STENT INTERVENTION  11/07/2011   Procedure: PERCUTANEOUS STENT INTERVENTION;  Surgeon: Lorretta Harp, MD;  Location[facility] CATH LAB;  Service: Cardiovascular;;  . SHOULDER ARTHROSCOPY W/ ROTATOR CUFF REPAIR Left 2010  . Sleep Study  01/09/2011   AHI during total sleep 14.4/hr and during REM 19.4/hr  . TRANSTHORACIC ECHOCARDIOGRAM  09/2012   EF 55-60%, LA mildly dilated    There were no vitals filed for this visit.  Subjective Assessment - 03/25/18 1517    Subjective  Pt reports that he had a heat attack did not go to the MD said he has had it before.     Currently in Pain?  No/denies    Pain Score  0-No pain                       [facility] Adult PT Treatment/Exercise - 03/25/18 0001      Ambulation/Gait   Gait Comments  down stairs outside up hill      High Level Balance   High Level Balance Activities  Marching forwards;Tandem walking;Side stepping;Negotiating over obstacles;Negotiating around obstacles;Figure 8 turns    High Level Balance Comments  side stepping over foam rolls       Lumbar Exercises: Machines for Strengthening   Cybex Knee Extension  10# 2x10    Cybex Knee Flexion  25# 2x15      Knee/Hip Exercises:  Aerobic   Nustep  level  3 x 6 minutes               PT Short Term Goals - 03/06/18 1351      PT SHORT TERM GOAL #1   Title  independent with initial HEP    Status  Partially Met        PT Long Term Goals - 03/25/18 1542      PT LONG TERM GOAL #3   Title  return to walking 1 miles for exercise    Status  On-going      PT LONG TERM GOAL #4   Title  walk on uneven surfaces without loss of balance or without reports of feeling fearful    Status  On-going            Plan - 03/25/18 1551    Clinical Impression Statement  Pt reports that he had a heart attack tats started Saturday and still felt it Sunday. He did not seek medical attentions. BP take 122/70. HR monitored throughout session and didn't rise above 103.  Progressing towards goals. Attempted side steps over foam roll but pt quit due to difficulty. He did well side stepping over foam roll. No issues walking down stairs outside over uneven terrain.    Rehab Potential  Good    PT Frequency  2x / week    PT Duration  8 weeks    PT Next Visit Plan  add as tolerated, strength and balance BERG       Patient will benefit from skilled therapeutic intervention in order to improve the following deficits and impairments:  Abnormal gait, Decreased activity tolerance, Decreased mobility, Decreased range of motion, Decreased strength, Difficulty walking, Impaired flexibility, Pain, Decreased balance, Decreased endurance, Postural dysfunction  Visit Diagnosis: Unsteadiness on feet  Muscle weakness (generalized)  Difficulty in walking, not elsewhere classified     Problem List Patient Active Problem List   Diagnosis Date Noted  . Coronary artery disease involving native coronary artery of native heart without angina pectoris 02/05/2017  . Palpitations 08/10/2016  . Paroxysmal atrial fibrillation ([place]) 08/10/2016  . Weakness of both lower extremities 11/09/2015  . Atrial flutter ([place]) 10/28/2014  . DOE (dyspnea on exertion) 10/10/2013  . PVC's (premature ventricular contractions) 09/26/2013  . OSA on CPAP 03/31/2013  . Hx of adenomatous colonic polyps 02/28/2012  . PAD (peripheral artery disease) ([place]) 11/08/2011  . HTN (hypertension) 11/08/2011  . Claudication [facility]) 11/08/2011    Scot Jun, PTA 03/25/2018, 3:53 PM  [place], [place], [address] Phone: [phone]   Fax:  [phone]  Name: Carl Harper MRN: [id number] Date of Birth: [birth year]

## 2018-03-28 DIAGNOSIS — N183 Chronic kidney disease, stage 3 unspecified: Secondary | ICD-10-CM | POA: Diagnosis present

## 2018-04-02 ENCOUNTER — Other Ambulatory Visit: Payer: Self-pay | Admitting: Internal Medicine

## 2018-04-04 NOTE — Progress Notes (Signed)
Electrophysiology Office Note Date: 04/05/2018  ID:  Carl Harper, DOB [birth year], MRN [id number]  PCP: Bernerd Limbo, MD Primary Cardiologist: [place] Electrophysiologist: Curt Bears  CC: AF and PVC follow up  Carl Harper is a 84 y.o. male seen today for Dr Curt Bears.  He presents today for routine electrophysiology followup.  Since last being seen in our clinic, the patient reports doing fairly well. For the last 6 weeks, he has had intermittent chest pain that is atypical and does not occur with exertion. NTG does help to relieve pain. He has also had worsening shortness of breath with exertion. Home BP cuff has recorded heart rates in the 160's. He is unaware of heart rate otherwise. He also has orthostatic intolerance.  He denies PND, orthopnea, nausea, vomiting, syncope, edema, weight gain, or early satiety.  Past Medical History:  Diagnosis Date  . Benign neoplasm of colon 10/24/2006  . BPH (benign prostatic hyperplasia)   . Daily headache   . Diverticulosis 09/03.2008  . Dysrhythmia    "irregular"  . Hypertension   . Internal hemorrhoids without mention of complication 26/94/8546  . Memory loss   . PAD (peripheral artery disease) ([place])   . Restless leg syndrome   . Sleep apnea    dx'd; wore mask; lost weight; turned in machine"   Past Surgical History:  Procedure Laterality Date  . APPENDECTOMY  ~ 1954  . ATHERECTOMY Right 11/07/2011   Procedure: ATHERECTOMY;  Surgeon: Lorretta Harp, MD;  Location: [facility] CATH LAB;  Service: Cardiovascular;  Laterality: Right;  . CARDIAC CATHETERIZATION  03/30/1999   non-critical CAD  . CATARACT EXTRACTION W/ INTRAOCULAR LENS  IMPLANT, BILATERAL  2011-2012  . ILIAC ARTERY STENT Right 11/07/2011   [place] orbital rotational atherectomy, PTA & stenting of calcified R CIA (Dr. Adora Fridge)  . LOWER EXTREMITY ANGIOGRAM N/A 11/07/2011   Procedure: LOWER EXTREMITY ANGIOGRAM;  Surgeon: Lorretta Harp, MD;  Location: [facility] CATH LAB;  Service:  Cardiovascular;  Laterality: N/A;  . NM MYOCAR PERF WALL MOTION  09/2012   lexiscan myoview - low risk with fixed inferior defect with underlying bowel attenuation suggestive of artifact  . PERCUTANEOUS STENT INTERVENTION  11/07/2011   Procedure: PERCUTANEOUS STENT INTERVENTION;  Surgeon: Lorretta Harp, MD;  Location: [facility] CATH LAB;  Service: Cardiovascular;;  . SHOULDER ARTHROSCOPY W/ ROTATOR CUFF REPAIR Left 2010  . Sleep Study  01/09/2011   AHI during total sleep 14.4/hr and during REM 19.4/hr  . TRANSTHORACIC ECHOCARDIOGRAM  09/2012   EF 55-60%, LA mildly dilated    Current Outpatient Medications  Medication Sig Dispense Refill  . aspirin 81 MG tablet Take 81 mg by mouth daily.    Marland Kitchen BIOTIN PO Take 1 tablet by mouth daily.     Marland Kitchen ELIQUIS 5 MG TABS tablet TAKE ONE TABLET BY MOUTH TWICE A DAY 180 tablet 3  . EVENING PRIMROSE OIL PO Take 1,300 mg by mouth daily.    . finasteride (PROSCAR) 5 MG tablet Take 5 mg by mouth daily.    . furosemide (LASIX) 20 MG tablet Take 1 tablet (20 mg total) by mouth daily. 90 tablet 3  . losartan (COZAAR) 25 MG tablet TAKE ONE TABLET BY MOUTH DAILY 30 tablet 5  . LUTEIN PO Take 1 tablet by mouth daily.     . Magnesium 200 MG TABS Take 1 tablet (200 mg total) by mouth daily. 30 each 4  . metoprolol succinate (TOPROL-XL) 25 MG 24 hr tablet TAKE  ONE TABLET BY MOUTH DAILY 30 tablet 5  . mexiletine (MEXITIL) 150 MG capsule Take 1 capsule (150 mg total) by mouth 2 (two) times daily. 180 capsule 3  . nitroGLYCERIN (NITROSTAT) 0.4 MG SL tablet Place 0.4 mg under the tongue every 5 (five) minutes as needed for chest pain.    . pramipexole (MIRAPEX) 0.75 MG tablet Take 1 tablet by mouth daily.    . RESTASIS 0.05 % ophthalmic emulsion Apply 1 drop to eye 2 (two) times daily.    . Tamsulosin HCl (FLOMAX) 0.4 MG CAPS Take 0.4 mg by mouth daily after supper.    . traZODone (DESYREL) 100 MG tablet Take 100 mg by mouth at bedtime.  3  . vitamin B-12 (CYANOCOBALAMIN) 100  MCG tablet Take 100 mcg by mouth daily.      No current facility-administered medications for this visit.     Allergies:   Amiodarone and Bee venom   Social History: Social History   Socioeconomic History  . Marital status: Married    Spouse name: Not on file  . Number of children: 0  . Years of education: 43  . Highest education level: Not on file  Occupational History  . Occupation: retired    Fish farm manager: [employer]  . Financial resource strain: Not on file  . Food insecurity:    Worry: Not on file    Inability: Not on file  . Transportation needs:    Medical: Not on file    Non-medical: Not on file  Tobacco Use  . Smoking status: Former Smoker    Packs/day: 1.00    Years: 55.00    Pack years: 55.00    Types: Cigarettes    Last attempt to quit: 02/20/2006    Years since quitting: 12.1  . Smokeless tobacco: Never Used  Substance and Sexual Activity  . Alcohol use: Yes    Alcohol/week: 14.0 standard drinks    Types: 14 Glasses of wine per week    Comment: 11/07/2011 "couple drinks of wine q night"  . Drug use: No  . Sexual activity: Not Currently  Lifestyle  . Physical activity:    Days per week: Not on file    Minutes per session: Not on file  . Stress: Not on file  Relationships  . Social connections:    Talks on phone: Not on file    Gets together: Not on file    Attends religious service: Not on file    Active member of club or organization: Not on file    Attends meetings of clubs or organizations: Not on file    Relationship status: Not on file  . Intimate partner violence:    Fear of current or ex partner: Not on file    Emotionally abused: Not on file    Physically abused: Not on file    Forced sexual activity: Not on file  Other Topics Concern  . Not on file  Social History Narrative  . Not on file    Family History: Family History  Problem Relation Age of Onset  . Heart Problems Father 85  . Cancer Brother 64  . Heart  failure Brother   . Cancer Maternal Grandfather        prostate    Review of Systems: All other systems reviewed and are otherwise negative except as noted above.   Physical Exam: VS:  BP 118/72   Pulse (!) 56   Ht 5\' 10"  (1.778 m)  Wt 179 lb (81.2 kg)   SpO2 99%   BMI 25.68 kg/m  , BMI Body mass index is 25.68 kg/m. Wt Readings from Last 3 Encounters:  04/05/18 179 lb (81.2 kg)  11/28/17 175 lb (79.4 kg)  09/11/17 172 lb (78 kg)    GEN- The patient is elderly appearing, alert and oriented x 3 today.   HEENT: normocephalic, atraumatic; sclera clear, conjunctiva pink; hearing intact; oropharynx clear; neck supple  Lungs- Clear to ausculation bilaterally, normal work of breathing.  No wheezes, rales, rhonchi Heart- Bradycardic irregular rate and rhythm  GI- soft, non-tender, non-distended, bowel sounds present  Extremities- no clubbing, cyanosis, trace BLE edema MS- no significant deformity or atrophy Skin- warm and dry, no rash or lesion  Psych- euthymic mood, full affect Neuro- strength and sensation are intact   EKG:  EKG is ordered today. The ekg ordered today shows typical atrial flutter, rate 56  Recent Labs: 06/12/2017: BUN 23; Creatinine, Ser 1.32; Magnesium 2.1; Potassium 4.7; Sodium 144    Other studies Reviewed: Additional studies/ records that were reviewed today include: Dr Debara Pickett and Dr Curt Bears' office notes   Assessment and Plan:  1.  Persistent atrial fibrillation/flutter Was previously on amiodarone - this was discontinued 2/2 corneal deposits Continue eliquis for CHADS2VASC of 5 BMET, CBC today  He is in typical atrial flutter today but is largely unaware. He has noted heart rates in the 160's at home. He is bradycardic at baseline Will obtain 48 hour holter monitor to evaluate further  2.  PVCs Continue Mexiletine  3.  CAD/chest pain/shortness of breath Will update echo and myoview  We discussed catheterization but with atypical symptoms,  will start with non-invasive testing.  Labs today  Current medicines are reviewed at length with the patient today.   The patient does not have concerns regarding his medicines.  The following changes were made today:  none  Labs/ tests ordered today include:  Orders Placed This Encounter  Procedures  . Basic metabolic panel  . Pro b natriuretic peptide (BNP)  . CBC  . Holter monitor - 48 hour  . MYOCARDIAL PERFUSION IMAGING  . EKG 12-Lead  . ECHOCARDIOGRAM COMPLETE     Disposition:   Follow up with Dr Debara Pickett as scheduled, Dr Curt Bears 2 weeks   Signed, Chanetta Marshall, NP 04/05/2018 10:28 AM   [address] [place] [address] [phone] (office) [phone] (fax)

## 2018-04-05 ENCOUNTER — Encounter: Payer: Self-pay | Admitting: Nurse Practitioner

## 2018-04-05 ENCOUNTER — Ambulatory Visit: Payer: Medicare HMO | Admitting: Nurse Practitioner

## 2018-04-05 ENCOUNTER — Other Ambulatory Visit: Payer: Self-pay | Admitting: Cardiology

## 2018-04-05 VITALS — BP 118/72 | HR 56 | Ht 70.0 in | Wt 179.0 lb

## 2018-04-05 DIAGNOSIS — I493 Ventricular premature depolarization: Secondary | ICD-10-CM

## 2018-04-05 DIAGNOSIS — R0600 Dyspnea, unspecified: Secondary | ICD-10-CM

## 2018-04-05 DIAGNOSIS — I2510 Atherosclerotic heart disease of native coronary artery without angina pectoris: Secondary | ICD-10-CM

## 2018-04-05 DIAGNOSIS — I4819 Other persistent atrial fibrillation: Secondary | ICD-10-CM

## 2018-04-05 DIAGNOSIS — R079 Chest pain, unspecified: Secondary | ICD-10-CM | POA: Diagnosis not present

## 2018-04-05 DIAGNOSIS — R42 Dizziness and giddiness: Secondary | ICD-10-CM

## 2018-04-05 LAB — BASIC METABOLIC PANEL WITH GFR
BUN/Creatinine Ratio: 19 (ref 10–24)
BUN: 26 mg/dL (ref 8–27)
CO2: 23 mmol/L (ref 20–29)
Calcium: 9.5 mg/dL (ref 8.6–10.2)
Chloride: 104 mmol/L (ref 96–106)
Creatinine, Ser: 1.39 mg/dL — ABNORMAL HIGH (ref 0.76–1.27)
GFR calc Af Amer: 54 mL/min/1.73 — ABNORMAL LOW
GFR calc non Af Amer: 47 mL/min/1.73 — ABNORMAL LOW
Glucose: 97 mg/dL (ref 65–99)
Potassium: 4.6 mmol/L (ref 3.5–5.2)
Sodium: 141 mmol/L (ref 134–144)

## 2018-04-05 LAB — CBC
HEMATOCRIT: 38.5 % (ref 37.5–51.0)
HEMOGLOBIN: 13 g/dL (ref 13.0–17.7)
MCH: 29.8 pg (ref 26.6–33.0)
MCHC: 33.8 g/dL (ref 31.5–35.7)
MCV: 88 fL (ref 79–97)
Platelets: 301 10*3/uL (ref 150–450)
RBC: 4.36 x10E6/uL (ref 4.14–5.80)
RDW: 12.2 % (ref 11.6–15.4)
WBC: 6.7 10*3/uL (ref 3.4–10.8)

## 2018-04-05 LAB — PRO B NATRIURETIC PEPTIDE: NT-Pro BNP: 616 pg/mL — ABNORMAL HIGH (ref 0–486)

## 2018-04-05 MED ORDER — MEXILETINE HCL 150 MG PO CAPS: 150.0000 mg | ORAL_CAPSULE | Freq: Two times a day (BID) | ORAL | 3 refills | Status: DC

## 2018-04-05 NOTE — Patient Instructions (Signed)
Medication Instructions:  none If you need a refill on your cardiac medications before your next appointment, please call your pharmacy.   Lab work:TODAY CBC BMET BNP  If you have labs (blood work) drawn today and your tests are completely normal, you will receive your results only by: Marland Kitchen MyChart Message (if you have MyChart) OR . A paper copy in the mail If you have any lab test that is abnormal or we need to change your treatment, we will call you to review the results.  Testing/Procedures:PLEASE SCHEDULE  Your physician has recommended that you wear a 48 HOUR holter monitor. Holter monitors are medical devices that record the heart's electrical activity. Doctors most often use these monitors to diagnose arrhythmias. Arrhythmias are problems with the speed or rhythm of the heartbeat. The monitor is a small, portable device. You can wear one while you do your normal daily activities. This is usually used to diagnose what is causing palpitations/syncope (passing out).  Your physician has requested that you have an echocardiogram. Echocardiography is a painless test that uses sound waves to create images of your heart. It provides your doctor with information about the size and shape of your heart and how well your heart's chambers and valves are working. This procedure takes approximately one hour. There are no restrictions for this procedure.  Your physician has requested that you have a lexiscan myoview. For further information please visit [email or web address]. Please follow instruction sheet, as given.    Follow-Up:Dr Camnitz 2 WEEKS  At [facility], you and your health needs are our priority.  As part of our continuing mission to provide you with exceptional heart care, we have created designated Provider Care Teams.  These Care Teams include your primary Cardiologist (physician) and Advanced Practice Providers (APPs -  Physician Assistants and Nurse Practitioners) who all work  together to provide you with the care you need, when you need it. .   Any Other Special Instructions Will Be Listed Below (If Applicable).

## 2018-04-05 NOTE — Addendum Note (Signed)
Addended by: Eulis Foster on: 04/05/2018 10:48 AM   Modules accepted: Orders

## 2018-04-08 ENCOUNTER — Telehealth: Payer: Self-pay

## 2018-04-08 NOTE — Telephone Encounter (Signed)
-----   Message from Patsey Berthold, NP sent at 04/06/2018  2:26 PM EST ----- Please notify patient of lab results. CBC, BMET stable. BNP slightly elevated. Will await echo and myoview results. Thanks!

## 2018-04-08 NOTE — Telephone Encounter (Signed)
Notes recorded by Frederik Schmidt, RN on 04/08/2018 at 8:51 AM EST The patient has been notified of the result and verbalized understanding. All questions (if any) were answered. Frederik Schmidt, RN 04/08/2018 8:51 AM

## 2018-04-18 ENCOUNTER — Telehealth (HOSPITAL_COMMUNITY): Payer: Self-pay | Admitting: *Deleted

## 2018-04-18 NOTE — Telephone Encounter (Signed)
Patient given detailed instructions per Myocardial Perfusion Study Information Sheet for the test on 04/23/18. Patient notified to arrive 15 minutes early and that it is imperative to arrive on time for appointment to keep from having the test rescheduled.  If you need to cancel or reschedule your appointment, please call the office within 24 hours of your appointment. . Patient verbalized understanding. Kenzley Ke Jacqueline    

## 2018-04-23 ENCOUNTER — Ambulatory Visit (HOSPITAL_COMMUNITY): Payer: Medicare HMO

## 2018-04-23 ENCOUNTER — Ambulatory Visit (INDEPENDENT_AMBULATORY_CARE_PROVIDER_SITE_OTHER): Payer: Medicare HMO

## 2018-04-23 ENCOUNTER — Ambulatory Visit (HOSPITAL_COMMUNITY): Payer: Medicare HMO | Attending: Cardiology

## 2018-04-23 DIAGNOSIS — R0600 Dyspnea, unspecified: Secondary | ICD-10-CM | POA: Diagnosis present

## 2018-04-23 DIAGNOSIS — I4819 Other persistent atrial fibrillation: Secondary | ICD-10-CM | POA: Diagnosis present

## 2018-04-23 DIAGNOSIS — R42 Dizziness and giddiness: Secondary | ICD-10-CM

## 2018-04-23 DIAGNOSIS — I493 Ventricular premature depolarization: Secondary | ICD-10-CM | POA: Diagnosis not present

## 2018-04-23 DIAGNOSIS — I2510 Atherosclerotic heart disease of native coronary artery without angina pectoris: Secondary | ICD-10-CM | POA: Diagnosis present

## 2018-04-23 DIAGNOSIS — R079 Chest pain, unspecified: Secondary | ICD-10-CM

## 2018-04-23 LAB — MYOCARDIAL PERFUSION IMAGING
CHL CUP NUCLEAR SRS: 0
CHL CUP NUCLEAR SSS: 1
CSEPPHR: 71 {beats}/min
Rest HR: 61 {beats}/min
SDS: 1
TID: 1.16

## 2018-04-23 MED ORDER — TECHNETIUM TC 99M TETROFOSMIN IV KIT
32.9000 | PACK | Freq: Once | INTRAVENOUS | Status: AC | PRN
  Administered 2018-04-23: 32.9 via INTRAVENOUS
  Filled 2018-04-23: qty 33

## 2018-04-23 MED ORDER — TECHNETIUM TC 99M TETROFOSMIN IV KIT
9.9000 | PACK | Freq: Once | INTRAVENOUS | Status: AC | PRN
  Administered 2018-04-23: 9.9 via INTRAVENOUS
  Filled 2018-04-23: qty 10

## 2018-04-23 MED ORDER — REGADENOSON 0.4 MG/5ML IV SOLN
0.4000 mg | Freq: Once | INTRAVENOUS | Status: AC
  Administered 2018-04-23: 0.4 mg via INTRAVENOUS

## 2018-04-26 ENCOUNTER — Telehealth: Payer: Self-pay

## 2018-04-26 NOTE — Telephone Encounter (Signed)
Notes recorded by Frederik Schmidt, RN on 04/26/2018 at 9:09 AM EST The patient has been notified of the result and verbalized understanding. All questions (if any) were answered. Frederik Schmidt, RN 04/26/2018 9:09 AM

## 2018-04-26 NOTE — Telephone Encounter (Signed)
-----   Message from Patsey Berthold, NP sent at 04/26/2018  8:05 AM EST ----- Please notify patient of myoview results. Low risk scan, will follow up after echo resulted (3/19). Thanks!

## 2018-04-26 NOTE — Telephone Encounter (Signed)
Notes recorded by Frederik Schmidt, RN on 04/26/2018 at 8:23 AM EST [email or web address] 3/6 ------

## 2018-04-26 NOTE — Telephone Encounter (Signed)
-----   Message from Carl Berthold, NP sent at 04/26/2018  8:05 AM EST ----- Please notify patient of myoview results. Low risk scan, will follow up after echo resulted (3/19). Thanks!

## 2018-05-09 ENCOUNTER — Other Ambulatory Visit (HOSPITAL_COMMUNITY): Payer: Medicare HMO

## 2018-05-09 ENCOUNTER — Ambulatory Visit (HOSPITAL_COMMUNITY): Payer: Medicare HMO | Attending: Cardiology

## 2018-05-09 ENCOUNTER — Other Ambulatory Visit: Payer: Self-pay

## 2018-05-09 DIAGNOSIS — I2510 Atherosclerotic heart disease of native coronary artery without angina pectoris: Secondary | ICD-10-CM | POA: Diagnosis not present

## 2018-05-09 DIAGNOSIS — R0600 Dyspnea, unspecified: Secondary | ICD-10-CM | POA: Diagnosis present

## 2018-05-09 DIAGNOSIS — I493 Ventricular premature depolarization: Secondary | ICD-10-CM | POA: Diagnosis not present

## 2018-05-09 DIAGNOSIS — R42 Dizziness and giddiness: Secondary | ICD-10-CM | POA: Diagnosis present

## 2018-05-09 DIAGNOSIS — R079 Chest pain, unspecified: Secondary | ICD-10-CM | POA: Diagnosis present

## 2018-05-09 DIAGNOSIS — I4819 Other persistent atrial fibrillation: Secondary | ICD-10-CM | POA: Diagnosis present

## 2018-05-09 LAB — ECHOCARDIOGRAM COMPLETE

## 2018-05-10 ENCOUNTER — Ambulatory Visit: Payer: Medicare HMO | Admitting: Cardiology

## 2018-05-24 ENCOUNTER — Telehealth: Payer: Self-pay | Admitting: *Deleted

## 2018-05-24 ENCOUNTER — Telehealth: Payer: Self-pay | Admitting: Cardiology

## 2018-05-24 NOTE — Telephone Encounter (Signed)
New message    Patient is returning call about setting up a virtual visit for an appt on 06/04/18. The patient has questions about the virtual visit. Please call to discuss.

## 2018-05-24 NOTE — Telephone Encounter (Signed)
Called patient to let them know due to recent [place] and Health Department Protocols, we are not seeing patients in the office. We are instead seeing if they would like to schedule this appointment as a Research scientist (medical) or Laptop. Unable to reach patient. LVMTCB

## 2018-05-27 NOTE — Telephone Encounter (Signed)
Pt inquiring as to appt next week.  Pt has not gotten instructions yet, but does have an Iphone. Pt aware that Terin will call him to help him get the app on his phone for virtual visit next week with Dr. Curt Bears. Forwarding to Terin to follow up with pt. Pt ask that we speak to his wife to coordinate all of this, states "she is better at this than me".

## 2018-05-28 ENCOUNTER — Telehealth: Payer: Self-pay

## 2018-05-28 NOTE — Telephone Encounter (Signed)
Spoke to wife.  She reports that they have an Iphone and can do doxy.me call with Camnitz next week. Wife will work on gaining access to [employer] for pt.  She will send [place] a message once completed.  Otherwise, it she has issues getting MyChart started then she understands Dr. Curt Bears can do a verbal consent over phone if necessary.

## 2018-05-28 NOTE — Telephone Encounter (Signed)
Called patient to let them know due to recent COVID19 CDC and Health Department Protocols, we are not seeing patients in the office. We are instead seeing if they would like to schedule this appointment as a WebEx Virtual Appointment VIA Smartphone or Laptop. Patient is aware if they decide to reschedule this appointment, they may not be seen or scheduled for the next 4-6 months. Patient is agreeable to WebEx. WebEx Virtual Visit Information sent VIA MyChart Message.  Patient will call us back if they are having issues uploading the app. Patient was advised to have pencil/pen and paper ready to take notes before, during and after the Virtual visit, to have BP, HR, and recent Wt, and any other concerns and questions ready prior to start of appointment.  Patient advised appointment times are set like a regular schedule and will begin possibly 10 minutes before set time. Appointment is not a floating schedule for that day.   

## 2018-05-28 NOTE — Telephone Encounter (Signed)
Attempted to contact pt to change appointment to virtual visit. Left message to call back. 

## 2018-05-29 NOTE — Telephone Encounter (Signed)
TELEPHONE CALL NOTE  Carl Harper has been deemed a candidate for a follow-up tele-health visit to limit community exposure during the Covid-19 pandemic. I spoke with the patient via phone to ensure availability of phone/video source, confirm preferred email & phone number, and discuss instructions and expectations.  I reminded Carl Harper to be prepared with any vital sign and/or heart rhythm information that could potentially be obtained via home monitoring, at the time of his visit. I reminded Carl Harper to expect a phone call at the time of his visit if his visit.  Did the patient verbally acknowledge consent to treatment? Yes  Meryl Crutch, RN 05/29/2018 10:18 AM   DOWNLOADING THE [place] TO SMARTPHONE  - If Apple, go to App Store and type in WebEx in the search bar. [place] [employer], the blue/green circle. The app is free but as with any other app downloads, their phone may require them to verify saved payment information or Apple password. The patient does NOT have to create an account.  - If Android, ask patient to go to [employer] and type in WebEx in the search bar. [place] [employer], the blue/green circle. The app is free but as with any other app downloads, their phone may require them to verify saved payment information or Android password. The patient does NOT have to create an account.   CONSENT FOR TELE-HEALTH VISIT - PLEASE REVIEW  I hereby voluntarily request, consent and authorize [facility] and its employed or contracted physicians, physician assistants, nurse practitioners or other licensed health care professionals (the Practitioner), to provide me with telemedicine health care services (the "Services") as deemed necessary by the treating Practitioner. I acknowledge and consent to receive the Services by the Practitioner via telemedicine. I understand that the telemedicine visit will involve communicating with the  Practitioner through live audiovisual communication technology and the disclosure of certain medical information by electronic transmission. I acknowledge that I have been given the opportunity to request an in-person assessment or other available alternative prior to the telemedicine visit and am voluntarily participating in the telemedicine visit.  I understand that I have the right to withhold or withdraw my consent to the use of telemedicine in the course of my care at any time, without affecting my right to future care or treatment, and that the Practitioner or I may terminate the telemedicine visit at any time. I understand that I have the right to inspect all information obtained and/or recorded in the course of the telemedicine visit and may receive copies of available information for a reasonable fee.  I understand that some of the potential risks of receiving the Services via telemedicine include:  Marland Kitchen Delay or interruption in medical evaluation due to technological equipment failure or disruption; . Information transmitted may not be sufficient (e.g. poor resolution of images) to allow for appropriate medical decision making by the Practitioner; and/or  . In rare instances, security protocols could fail, causing a breach of personal health information.  Furthermore, I acknowledge that it is my responsibility to provide information about my medical history, conditions and care that is complete and accurate to the best of my ability. I acknowledge that Practitioner's advice, recommendations, and/or decision may be based on factors not within their control, such as incomplete or inaccurate data provided by me or distortions of diagnostic images or specimens that may result from electronic transmissions. I understand that the practice of medicine is not an  exact science and that Practitioner makes no warranties or guarantees regarding treatment outcomes. I acknowledge that I will receive a copy of this  consent concurrently upon execution via email to the email address I last provided but may also request a printed copy by calling the office of [place].    I understand that my insurance will be billed for this visit.   I have read or had this consent read to me. . I understand the contents of this consent, which adequately explains the benefits and risks of the Services being provided via telemedicine.  . I have been provided ample opportunity to ask questions regarding this consent and the Services and have had my questions answered to my satisfaction. . I give my informed consent for the services to be provided through the use of telemedicine in my medical care  By participating in this telemedicine visit I agree to the above.

## 2018-06-03 ENCOUNTER — Encounter: Payer: Self-pay | Admitting: Internal Medicine

## 2018-06-03 ENCOUNTER — Telehealth (INDEPENDENT_AMBULATORY_CARE_PROVIDER_SITE_OTHER): Payer: Medicare HMO | Admitting: Internal Medicine

## 2018-06-03 VITALS — BP 115/50 | HR 66 | Ht 70.0 in | Wt 174.0 lb

## 2018-06-03 DIAGNOSIS — I10 Essential (primary) hypertension: Secondary | ICD-10-CM | POA: Diagnosis not present

## 2018-06-03 DIAGNOSIS — I2510 Atherosclerotic heart disease of native coronary artery without angina pectoris: Secondary | ICD-10-CM

## 2018-06-03 DIAGNOSIS — I2089 Other forms of angina pectoris: Secondary | ICD-10-CM

## 2018-06-03 DIAGNOSIS — Z7901 Long term (current) use of anticoagulants: Secondary | ICD-10-CM

## 2018-06-03 DIAGNOSIS — Z95820 Peripheral vascular angioplasty status with implants and grafts: Secondary | ICD-10-CM | POA: Diagnosis not present

## 2018-06-03 DIAGNOSIS — I4892 Unspecified atrial flutter: Secondary | ICD-10-CM | POA: Diagnosis not present

## 2018-06-03 DIAGNOSIS — E785 Hyperlipidemia, unspecified: Secondary | ICD-10-CM

## 2018-06-03 DIAGNOSIS — I4819 Other persistent atrial fibrillation: Secondary | ICD-10-CM

## 2018-06-03 DIAGNOSIS — G4733 Obstructive sleep apnea (adult) (pediatric): Secondary | ICD-10-CM

## 2018-06-03 DIAGNOSIS — I739 Peripheral vascular disease, unspecified: Secondary | ICD-10-CM | POA: Diagnosis not present

## 2018-06-03 DIAGNOSIS — Z9989 Dependence on other enabling machines and devices: Secondary | ICD-10-CM

## 2018-06-03 DIAGNOSIS — I493 Ventricular premature depolarization: Secondary | ICD-10-CM

## 2018-06-03 DIAGNOSIS — I208 Other forms of angina pectoris: Secondary | ICD-10-CM

## 2018-06-03 DIAGNOSIS — Z7189 Other specified counseling: Secondary | ICD-10-CM

## 2018-06-03 NOTE — Patient Instructions (Addendum)
Medication Instructions:  STOP taking Lasix every day. Only take as needed.  If you need a refill on your cardiac medications before your next appointment, please call your pharmacy.    Follow-Up: At [facility], you and your health needs are our priority.  As part of our continuing mission to provide you with exceptional heart care, we have created designated Provider Care Teams.  These Care Teams include your primary Cardiologist (physician) and Advanced Practice Providers (APPs -  Physician Assistants and Nurse Practitioners) who all work together to provide you with the care you need, when you need it. You will need a follow up appointment in 3 months.  Please call our office 2 months in advance to schedule this appointment.  You may see Pixie Casino, MD or one of the following Advanced Practice Providers on your designated Care Team: [place], [place] . Fabian Sharp, PA-C  Any Other Special Instructions Will Be Listed Below (If Applicable). None

## 2018-06-03 NOTE — Progress Notes (Signed)
Virtual Visit via Video Note   This visit type was conducted due to national recommendations for restrictions regarding the COVID-19 Pandemic (e.g. social distancing) in an effort to limit this patient's exposure and mitigate transmission in our community.  Due to his co-morbid illnesses, this patient is at least at moderate risk for complications without adequate follow up.  This format is felt to be most appropriate for this patient at this time.  All issues noted in this document were discussed and addressed.  A limited physical exam was performed with this format.  Please refer to the patient's chart for his consent to telehealth for [facility].   Evaluation Performed:  Video follow-up for chest pain  Date:  06/03/2018   ID:  Carl Harper, DOB 1934-07-15, MRN [id number]  Patient Location:  [place] [address]  Provider location:   [address], [place], [place] [address]  PCP:  Bernerd Limbo, MD  Cardiologist:  Pixie Casino, MD Electrophysiologist:  None   Chief Complaint:  Chest pain, fatigue  History of Present Illness:    Carl Harper is a 84 y.o. male who presents via audio/video conferencing for a telehealth visit today.  Carl Harper is an 84 year old male with recent persistent atrial fibrillation/atrial flutter.  He was wearing a monitor which showed a burden of 99% and 1% burden of PVCs, after starting on mexiletine.  He has followed tenably with Dr. Curt Bears in cardiac electrophysiology.  During a recent visit with Benjaman Pott, FNP, he had complained of chest pain.  A repeat nuclear stress test was performed which showed a small fixed defect likely attenuation artifact or scar.  An echo was performed which showed normal LVEF and mild left atrial enlargement.  He reports persistent fatigue as well as chest pain.  He said in the past he had used nitro very infrequently, once or twice a year and a few weeks ago he had to take  nitroglycerin several times.  Since then however he has had no further chest pain symptoms.  Overall stress test was a low risk.  He is scheduled for follow-up telehealth visit with Dr. Curt Bears tomorrow.  It should also be noted he has a degree of diastolic hypertension with diastolic blood pressure around 50.  The patient does not have symptoms concerning for COVID-19 infection (fever, chills, cough, or new SHORTNESS OF BREATH).    Prior CV studies:   The following studies were reviewed today:  Echo (05/09/2018) Holter (04/23/2018) Lexiscan myoview (04/23/2018)  PMHx:  Past Medical History:  Diagnosis Date  . Benign neoplasm of colon 10/24/2006  . BPH (benign prostatic hyperplasia)   . Daily headache   . Diverticulosis 09/03.2008  . Dysrhythmia    "irregular"  . Hypertension   . Internal hemorrhoids without mention of complication 28/78/6767  . Memory loss   . PAD (peripheral artery disease) ([place])   . Restless leg syndrome   . Sleep apnea    dx'd; wore mask; lost weight; turned in machine"    Past Surgical History:  Procedure Laterality Date  . APPENDECTOMY  ~ 1954  . ATHERECTOMY Right 11/07/2011   Procedure: ATHERECTOMY;  Surgeon: Lorretta Harp, MD;  [facility];  Service: Cardiovascular;  Laterality: Right;  . CARDIAC CATHETERIZATION  03/30/1999   non-critical CAD  . CATARACT EXTRACTION W/ INTRAOCULAR LENS  IMPLANT, BILATERAL  2011-2012  . ILIAC ARTERY STENT Right 11/07/2011   Diamondback orbital rotational atherectomy, PTA & stenting  of calcified R CIA (Dr. Adora Fridge)  . LOWER EXTREMITY ANGIOGRAM N/A 11/07/2011   Procedure: LOWER EXTREMITY ANGIOGRAM;  Surgeon: Lorretta Harp, MD;  Location: [facility] LAB;  Service: Cardiovascular;  Laterality: N/A;  . NM MYOCAR PERF WALL MOTION  09/2012   lexiscan myoview - low risk with fixed inferior defect with underlying bowel attenuation suggestive of artifact  . PERCUTANEOUS STENT INTERVENTION  11/07/2011   Procedure:  PERCUTANEOUS STENT INTERVENTION;  Surgeon: Lorretta Harp, MD;  Location: [facility] CATH LAB;  Service: Cardiovascular;;  . SHOULDER ARTHROSCOPY W/ ROTATOR CUFF REPAIR Left 2010  . Sleep Study  01/09/2011   AHI during total sleep 14.4/hr and during REM 19.4/hr  . TRANSTHORACIC ECHOCARDIOGRAM  09/2012   EF 55-60%, LA mildly dilated    FAMHx:  Family History  Problem Relation Age of Onset  . Heart Problems Father 25  . Cancer Brother 25  . Heart failure Brother   . Cancer Maternal Grandfather        prostate    SOCHx:   reports that he quit smoking about 12 years ago. His smoking use included cigarettes. He has a 55.00 pack-year smoking history. He has never used smokeless tobacco. He reports current alcohol use of about 14.0 standard drinks of alcohol per week. He reports that he does not use drugs.  ALLERGIES:  Allergies  Allergen Reactions  . Amiodarone Other (See Comments)    Other reaction(s): Other (See Comments) Corneal deposits Corneal deposits  . Bee Venom Other (See Comments)    unknown    MEDS:  Current Meds  Medication Sig  . aspirin 81 MG tablet Take 81 mg by mouth daily.  Marland Kitchen BIOTIN PO Take 1 tablet by mouth daily.   Marland Kitchen ELIQUIS 5 MG TABS tablet TAKE ONE TABLET BY MOUTH TWICE A DAY  . EVENING PRIMROSE OIL PO Take 1,300 mg by mouth daily.  . finasteride (PROSCAR) 5 MG tablet Take 5 mg by mouth daily.  . furosemide (LASIX) 20 MG tablet Take 1 tablet (20 mg total) by mouth daily.  Marland Kitchen losartan (COZAAR) 25 MG tablet TAKE ONE TABLET BY MOUTH DAILY  . LUTEIN PO Take 1 tablet by mouth daily.   . Magnesium 200 MG TABS Take 1 tablet (200 mg total) by mouth daily.  . metoprolol succinate (TOPROL-XL) 25 MG 24 hr tablet TAKE ONE TABLET BY MOUTH DAILY  . mexiletine (MEXITIL) 150 MG capsule Take 1 capsule (150 mg total) by mouth 2 (two) times daily.  Marland Kitchen MYRBETRIQ 50 MG TB24 tablet Take 1 tablet by mouth daily.  . nitroGLYCERIN (NITROSTAT) 0.4 MG SL tablet Place 0.4 mg under the  tongue every 5 (five) minutes as needed for chest pain.  . pramipexole (MIRAPEX) 0.75 MG tablet Take 1 tablet by mouth daily.  . RESTASIS 0.05 % ophthalmic emulsion Apply 1 drop to eye 2 (two) times daily.  . Tamsulosin HCl (FLOMAX) 0.4 MG CAPS Take 0.4 mg by mouth daily after supper.  . traZODone (DESYREL) 100 MG tablet Take 100 mg by mouth at bedtime.  . vitamin B-12 (CYANOCOBALAMIN) 100 MCG tablet Take 100 mcg by mouth daily.      ROS: Pertinent items noted in HPI and remainder of comprehensive ROS otherwise negative.  Labs/Other Tests and Data Reviewed:    Recent Labs: 06/12/2017: Magnesium 2.1 04/05/2018: BUN 26; Creatinine, Ser 1.39; Hemoglobin 13.0; NT-Pro BNP 616; Platelets 301; Potassium 4.6; Sodium 141   Recent Lipid Panel Lab Results  Component Value Date/Time  CHOL 189 04/11/2013 10:58 AM   TRIG 59 04/11/2013 10:58 AM   HDL 77 04/11/2013 10:58 AM   LDLCALC 100 (H) 04/11/2013 10:58 AM    Wt Readings from Last 3 Encounters:  06/03/18 174 lb (78.9 kg)  04/23/18 179 lb (81.2 kg)  04/05/18 179 lb (81.2 kg)     Exam:    Vital Signs:  BP (!) 115/50   Pulse 66   Ht 5\' 10"  (1.778 m)   Wt 174 lb (78.9 kg)   BMI 24.97 kg/m    General appearance: alert, appears stated age and icteric Lungs: no audible wheezing or visualized difficulty breathing Extremities: extremities normal, atraumatic, no cyanosis or edema Skin: pale Neurologic: Mental status: Alert, oriented, thought content appropriate Psych: Pleasant, non-anxious  ASSESSMENT & PLAN:    1. 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 PVC's -suppressed with mexilitine 3. PAD status post diamondback were orbital atherectomy and stenting of the right common iliac artery 4. Hypertension 5. Dyslipidemia 6. Mild CAD 7. Obstructive sleep apnea on CPAP 8. DOE - negative treadmill stress test 9. Tremor/memory loss/gait  abnormalities - no evidence of Parkinson's by recent neurology consult  Mr. Urbanski had reported chest pain 3 weeks ago, but has not needed nitro since then, There was no significant reversible ischemia on his nuclear stress test in early March.  His echo showed normal LV function.  His monitor showed persistent atrial fibrillation/flutter at a 99% burden.  There was a smaller 1% burden of PVCs.  He is on mexiletine.  He has a follow-up with his electrophysiologist Dr. Curt Bears tomorrow via video visit.  He has felt fatigued.  I wonder if this may also be due to diastolic hypotension.  His diastolic BPs are 50.  No evidence of heart failure.  Based on the echo I would recommend discontinuing daily Lasix and using it as needed.  He will need to continue to monitor blood pressures.  I would encourage using nitroglycerin as needed at this time and if he is requiring to take more than 3 a week we may consider starting long-acting Imdur.  Plan follow-up with me in 3 months.  COVID-19 Education: The signs and symptoms of COVID-19 were discussed with the patient and how to seek care for testing (follow up with PCP or arrange E-visit).  The importance of social distancing was discussed today.  Patient Risk:   After full review of this patients clinical status, I feel that they are at least moderate risk at this time.  Time:   Today, I have spent 25 minutes with the patient with telehealth technology discussing chest pain, afib/flutter. PVC's, antiarrythmic therapy, results of his stress test, echo and monitor.     Medication Adjustments/Labs and Tests Ordered: Current medicines are reviewed at length with the patient today.  Concerns regarding medicines are outlined above.   Tests Ordered: No orders of the defined types were placed in this encounter.   Medication Changes: No orders of the defined types were placed in this encounter.   Disposition:  in 3 month(s)  Pixie Casino, MD, [facility], [place] Director of the Advanced Lipid Disorders &  Cardiovascular Risk Reduction Clinic Diplomate of the American Board of Clinical Lipidology Attending Cardiologist  Direct Dial: [phone]  Fax: [phone]  Website:  [email or web address]  Pixie Casino, MD  06/03/2018 11:02 AM

## 2018-06-04 ENCOUNTER — Telehealth: Payer: Self-pay | Admitting: *Deleted

## 2018-06-04 ENCOUNTER — Telehealth (INDEPENDENT_AMBULATORY_CARE_PROVIDER_SITE_OTHER): Payer: Medicare HMO | Admitting: Cardiology

## 2018-06-04 ENCOUNTER — Other Ambulatory Visit: Payer: Medicare HMO

## 2018-06-04 ENCOUNTER — Other Ambulatory Visit: Payer: Self-pay

## 2018-06-04 ENCOUNTER — Ambulatory Visit: Payer: Medicare HMO | Admitting: Internal Medicine

## 2018-06-04 ENCOUNTER — Encounter: Payer: Self-pay | Admitting: Cardiology

## 2018-06-04 VITALS — BP 120/55 | HR 67 | Temp 96.2°F | Wt 174.0 lb

## 2018-06-04 DIAGNOSIS — Z7901 Long term (current) use of anticoagulants: Secondary | ICD-10-CM | POA: Diagnosis not present

## 2018-06-04 DIAGNOSIS — I4819 Other persistent atrial fibrillation: Secondary | ICD-10-CM | POA: Diagnosis not present

## 2018-06-04 DIAGNOSIS — I493 Ventricular premature depolarization: Secondary | ICD-10-CM | POA: Diagnosis not present

## 2018-06-04 DIAGNOSIS — Z01812 Encounter for preprocedural laboratory examination: Secondary | ICD-10-CM

## 2018-06-04 DIAGNOSIS — Z79899 Other long term (current) drug therapy: Secondary | ICD-10-CM

## 2018-06-04 DIAGNOSIS — I714 Abdominal aortic aneurysm, without rupture, unspecified: Secondary | ICD-10-CM

## 2018-06-04 LAB — BASIC METABOLIC PANEL
BUN/Creatinine Ratio: 17 (ref 10–24)
BUN: 22 mg/dL (ref 8–27)
CO2: 27 mmol/L (ref 20–29)
Calcium: 8.5 mg/dL — ABNORMAL LOW (ref 8.6–10.2)
Chloride: 104 mmol/L (ref 96–106)
Creatinine, Ser: 1.31 mg/dL — ABNORMAL HIGH (ref 0.76–1.27)
GFR calc Af Amer: 58 mL/min/{1.73_m2} — ABNORMAL LOW (ref >59)
GFR calc non Af Amer: 50 mL/min/{1.73_m2} — ABNORMAL LOW (ref >59)
Glucose: 124 mg/dL — ABNORMAL HIGH (ref 65–99)
Potassium: 4 mmol/L (ref 3.5–5.2)
Sodium: 138 mmol/L (ref 134–144)

## 2018-06-04 NOTE — Progress Notes (Signed)
Electrophysiology TeleHealth Note   Due to national recommendations of social distancing due to COVID 19, an audio/video telehealth visit is felt to be most appropriate for this patient at this time.  See MyChart message from today for the patient's consent to telehealth for [facility].   Date:  06/04/2018   ID:  Carl Harper, DOB 04-Jun-1934, MRN [id number]  Location: patient's home  Provider location: [address] [place]  Evaluation Performed: Follow-up visit  PCP:  Bernerd Limbo, MD  Cardiologist:  Pixie Casino, MD  Electrophysiologist:  Dr Curt Bears  Chief Complaint:  PVC, AF  History of Present Illness:    Carl Harper is a 84 y.o. male who presents via audio/video conferencing for a telehealth visit today.  Since last being seen in our clinic, the patient reports doing very well.  Today, he denies symptoms of palpitations, chest pain, shortness of breath,  lower extremity edema, dizziness, presyncope, or syncope.  The patient is otherwise without complaint today.  The patient denies symptoms of fevers, chills, cough, or new SOB worrisome for COVID 19.  He has a history of hypertension, PAD, persistent atrial fibrillation, and PVCs.  He had a cardiac monitor that showed 99% atrial fibrillation and 1.8% PVCs.  Today, denies symptoms of palpitations, chest pain, shortness of breath, orthopnea, PND, lower extremity edema, claudication, dizziness, presyncope, syncope, bleeding, or neurologic sequela. The patient is tolerating medications without difficulties.  On his echo, he was noted to have a normal ejection fraction.  Despite that, he did have a moderate to severely enlarged AAA at 50 mm.  He does continue to have chest pain and shortness of breath.   Past Medical History:  Diagnosis Date  . Benign neoplasm of colon 10/24/2006  . BPH (benign prostatic hyperplasia)   . Daily headache   . Diverticulosis 09/03.2008  . Dysrhythmia    "irregular"  .  Hypertension   . Internal hemorrhoids without mention of complication 25/85/2778  . Memory loss   . PAD (peripheral artery disease) ([place])   . Restless leg syndrome   . Sleep apnea    dx'd; wore mask; lost weight; turned in machine"    Past Surgical History:  Procedure Laterality Date  . APPENDECTOMY  ~ 1954  . ATHERECTOMY Right 11/07/2011   Procedure: ATHERECTOMY;  Surgeon: Lorretta Harp, MD;  Location: [facility] CATH LAB;  Service: Cardiovascular;  Laterality: Right;  . CARDIAC CATHETERIZATION  03/30/1999   non-critical CAD  . CATARACT EXTRACTION W/ INTRAOCULAR LENS  IMPLANT, BILATERAL  2011-2012  . ILIAC ARTERY STENT Right 11/07/2011   Diamondback orbital rotational atherectomy, PTA & stenting of calcified R CIA (Dr. Adora Fridge)  . LOWER EXTREMITY ANGIOGRAM N/A 11/07/2011   Procedure: LOWER EXTREMITY ANGIOGRAM;  Surgeon: Lorretta Harp, MD;  Location: [facility] CATH LAB;  Service: Cardiovascular;  Laterality: N/A;  . NM MYOCAR PERF WALL MOTION  09/2012   lexiscan myoview - low risk with fixed inferior defect with underlying bowel attenuation suggestive of artifact  . PERCUTANEOUS STENT INTERVENTION  11/07/2011   Procedure: PERCUTANEOUS STENT INTERVENTION;  Surgeon: Lorretta Harp, MD;  Location: [facility] CATH LAB;  Service: Cardiovascular;;  . SHOULDER ARTHROSCOPY W/ ROTATOR CUFF REPAIR Left 2010  . Sleep Study  01/09/2011   AHI during total sleep 14.4/hr and during REM 19.4/hr  . TRANSTHORACIC ECHOCARDIOGRAM  09/2012   EF 55-60%, LA mildly dilated    Current Outpatient Medications  Medication Sig Dispense Refill  .  aspirin 81 MG tablet Take 81 mg by mouth daily.    Marland Kitchen BIOTIN PO Take 1 tablet by mouth daily.     Marland Kitchen ELIQUIS 5 MG TABS tablet TAKE ONE TABLET BY MOUTH TWICE A DAY 180 tablet 3  . EVENING PRIMROSE OIL PO Take 1,300 mg by mouth daily.    . finasteride (PROSCAR) 5 MG tablet Take 5 mg by mouth daily.    . furosemide (LASIX) 20 MG tablet Take 1 tablet (20 mg total) by mouth daily. 90 tablet 3   . losartan (COZAAR) 25 MG tablet TAKE ONE TABLET BY MOUTH DAILY 30 tablet 5  . LUTEIN PO Take 1 tablet by mouth daily.     . Magnesium 200 MG TABS Take 1 tablet (200 mg total) by mouth daily. 30 each 4  . metoprolol succinate (TOPROL-XL) 25 MG 24 hr tablet TAKE ONE TABLET BY MOUTH DAILY 30 tablet 5  . mexiletine (MEXITIL) 150 MG capsule Take 1 capsule (150 mg total) by mouth 2 (two) times daily. 180 capsule 3  . MYRBETRIQ 50 MG TB24 tablet Take 1 tablet by mouth daily.    . nitroGLYCERIN (NITROSTAT) 0.4 MG SL tablet Place 0.4 mg under the tongue every 5 (five) minutes as needed for chest pain.    . pramipexole (MIRAPEX) 0.75 MG tablet Take 1 tablet by mouth daily.    . RESTASIS 0.05 % ophthalmic emulsion Apply 1 drop to eye 2 (two) times daily.    . Tamsulosin HCl (FLOMAX) 0.4 MG CAPS Take 0.4 mg by mouth daily after supper.    . traZODone (DESYREL) 100 MG tablet Take 100 mg by mouth at bedtime.  3  . vitamin B-12 (CYANOCOBALAMIN) 100 MCG tablet Take 100 mcg by mouth daily.      No current facility-administered medications for this visit.     Allergies:   Amiodarone and Bee venom   Social History:  The patient  reports that he quit smoking about 12 years ago. His smoking use included cigarettes. He has a 55.00 pack-year smoking history. He has never used smokeless tobacco. He reports current alcohol use of about 14.0 standard drinks of alcohol per week. He reports that he does not use drugs.   Family History:  The patient's  family history includes Cancer in his maternal grandfather; Cancer (age of onset: 15) in his brother; Heart Problems (age of onset: 89) in his father; Heart failure in his brother.   ROS:  Please see the history of present illness.   All other systems are personally reviewed and negative.    Exam:    Vital Signs:  BP (!) 120/55   Pulse 67 Comment: irregular  Temp (!) 96.2 F (35.7 C)   Wt 174 lb (78.9 kg)   BMI 24.97 kg/m   Well appearing, alert and  conversant, regular work of breathing,  good skin color Eyes- anicteric, neuro- grossly intact, skin- no apparent rash or lesions or cyanosis, mouth- oral mucosa is pink   Labs/Other Tests and Data Reviewed:    Recent Labs: 06/12/2017: Magnesium 2.1 04/05/2018: BUN 26; Creatinine, Ser 1.39; Hemoglobin 13.0; NT-Pro BNP 616; Platelets 301; Potassium 4.6; Sodium 141   Wt Readings from Last 3 Encounters:  06/04/18 174 lb (78.9 kg)  06/03/18 174 lb (78.9 kg)  04/23/18 179 lb (81.2 kg)     Other studies personally reviewed: Additional studies/ records that were reviewed today include: TTE 05/09/18  Review of the above records today demonstrates:    1.  The left ventricle has normal systolic function, with an ejection fraction of 55-60%. The cavity size was normal. NA due to atrial flutter. No evidence of left ventricular regional wall motion abnormalities.  2. The right ventricle has normal systolic function. The cavity was normal. There is no increase in right ventricular wall thickness.  3. The aortic valve is tricuspid Mild thickening of the aortic valve Mild calcification of the aortic valve. Aortic valve regurgitation was not assessed by color flow Doppler.  4. Partial restriction at left and noncoronary commissure.  5. There is moderate to severe dilatation of the ascending aorta measuring 50 mm.  Holter 04/29/18 - personally reviewed Minimum HR: 33 BPM at 8:57:46 AM(2) Maximum HR: 99 BPM at 9:30:02 PM(2) Average HR: 69 BPM 1.2% PVCs 99% atrial fibrillation/flutter  ECG: personally reviewed 04/05/18 Atrial flutter, iLBBB, rate 56  Myoview 04/23/18  There was no ST segment deviation noted during stress.  There is a small defect of mild severity present in the apical lateral location. The defect is non-reversible and consistent with diaphragmatic attenuation artifact vs. Focal scar. Recommend 2D echo to assess wall motion in this area. Study was not gated due to atrial flutter.  Images  not gated due to underlying atrial flutter.  Consider 2D echo to assess for wall motion abnormality in the apical lateral wall.  This is a low risk study.  ASSESSMENT & PLAN:    1.  Persistent atrial fibrillation: On Eliquis.  Previously on amiodarone but this was discontinued secondary to corneal deposits.  Or cardiac monitor that showed overall well controlled rates.  Chads 2 vasc 5.  Continue current management  2.  PVCs: Continue mexiletine  3.  Coronary artery disease test chest pain/shortness of breath: Recent Myoview was read as low risk.  Was found to have a normal ejection fraction on echo.  4.  Dilated asending aorta at 50 mm: I am concerned that he has a potentially an aortic dissection.  His blood pressure and heart rate are well controlled.  I am concerned as his aorta has greatly increased in size over the past 2 years.  We Taahir Grisby get a CTA tomorrow for further evaluation.    Case discussed with primary cardiology  COVID 19 screen The patient denies symptoms of COVID 19 at this time.  The importance of social distancing was discussed today.  Follow-up:  Pending CT   Current medicines are reviewed at length with the patient today.   The patient does not have concerns regarding his medicines.  The following changes were made today:  none  Labs/ tests ordered today include:  Orders Placed This Encounter  Procedures  . CT ANGIO CHEST AORTA W &/OR WO CONTRAST  . Basic metabolic panel     Patient Risk:  after full review of this patients clinical status, I feel that they are at moderate risk at this time.  Today, I have spent 15 minutes with the patient with telehealth technology discussing AAA, atrial filbrilation, chest pain.    Signed, Ami Mally Meredith Leeds, MD  06/04/2018 11:12 AM     [facility] [address] [place] [address] [phone] (office) [phone] (fax)

## 2018-06-04 NOTE — Patient Instructions (Addendum)
Medication Instructions:  Your physician recommends that you continue on your current medications as directed. Please refer to the Current Medication list given to you today. * If you need a refill on your cardiac medications before your next appointment, please call your pharmacy.   Labwork: Today: BMET *We will only notify you of abnormal results, otherwise continue current treatment plan.  Testing/Procedures: Non-Cardiac CT Angiography (CTA), is a special type of CT scan that uses a computer to produce multi-dimensional views of major blood vessels throughout the body. In CT angiography, a contrast material is injected through an IV to help visualize the blood vessels  The office will contact you to arrange this test.  Follow-Up: To be determined once CT is completed  Thank you for choosing [facility]!!   Trinidad Curet, RN [phone]

## 2018-06-04 NOTE — Telephone Encounter (Signed)
Covid-19 travel screening questions  Have you traveled in the last 14 days? If yes where? No Do you now or have you had a fever in the last 14 days? No Do you have any respiratory symptoms of shortness of breath or cough now or in the last 14 days? No Do you have any family members or close contacts with diagnosed or suspected Covid-19? No      

## 2018-06-05 ENCOUNTER — Ambulatory Visit (INDEPENDENT_AMBULATORY_CARE_PROVIDER_SITE_OTHER)
Admission: RE | Admit: 2018-06-05 | Discharge: 2018-06-05 | Disposition: A | Discharge status: Home / Self Care | Payer: Medicare HMO | Source: Ambulatory Visit | Attending: Cardiology | Admitting: Cardiology

## 2018-06-05 ENCOUNTER — Other Ambulatory Visit: Payer: Self-pay | Admitting: Cardiology

## 2018-06-05 ENCOUNTER — Telehealth: Payer: Self-pay | Admitting: Cardiology

## 2018-06-05 DIAGNOSIS — I714 Abdominal aortic aneurysm, without rupture, unspecified: Secondary | ICD-10-CM

## 2018-06-05 MED ORDER — IOPAMIDOL (ISOVUE-370) INJECTION 76%
75.0000 mL | Freq: Once | INTRAVENOUS | Status: AC | PRN
  Administered 2018-06-05: 08:00:00 75 mL via INTRAVENOUS

## 2018-06-05 NOTE — Telephone Encounter (Signed)
Spoke with pt and made him aware that results of the CT that he had done today are not available at this time.  Advised once Dr. Curt Bears reviews, his nurse will be in touch with results.

## 2018-06-05 NOTE — Telephone Encounter (Signed)
Patient calling for CT results. 

## 2018-06-06 NOTE — Telephone Encounter (Signed)
Result reviewed w/ pt.  (Dr. Curt Bears reviewed this morning) Pt made aware Dr. Curt Bears was going to discuss further with Dr. Debara Pickett and we would call once they have spoken. Pt aware I will call him by end of next week. Pt agreeable to plan.

## 2018-06-13 ENCOUNTER — Other Ambulatory Visit: Payer: Self-pay | Admitting: Adult Health

## 2018-06-13 NOTE — Telephone Encounter (Signed)
Updated pt that physicians have not yet spoken. Aware we will follow up next week. Pt agreeable

## 2018-06-18 ENCOUNTER — Telehealth: Payer: Self-pay | Admitting: Internal Medicine

## 2018-06-18 NOTE — Telephone Encounter (Signed)
Spoke to patient ,RN informed patient would sent message to Dr Debara Pickett. Patient states he spoke to Dr Curt Bears nurse last week.m RN not sure if Dr Curt Bears  and Dr Debara Pickett have discuss patient's  CT SCAN.   Will defer to Dr Debara Pickett  - patient is eagerly awaiting., voiced understanding

## 2018-06-18 NOTE — Telephone Encounter (Signed)
New Message    Pt is calling for CT results   Please call back

## 2018-06-19 NOTE — Telephone Encounter (Signed)
Thanks .. d/w Dr. Curt Bears - aorta is enlarged, but no dissection. Will recommend long-acting nitrate for chest pain. Dr. Macky Lower nurse will notify the patient.  Dr Lemmie Evens

## 2018-06-19 NOTE — Telephone Encounter (Signed)
Pt calling back again to get results of CT-waiting on Dr. Debara Pickett and Dr. Curt Bears to discuss and get with Carl Harper. Patient to be called when all parties involved have talked. Pls advise when done- [phone]

## 2018-06-20 MED ORDER — ISOSORBIDE MONONITRATE ER 30 MG PO TB24: 30.0000 mg | ORAL_TABLET | Freq: Every day | ORAL | 3 refills | Status: DC

## 2018-06-20 NOTE — Telephone Encounter (Signed)
Follow Up:; ° ° °Returning your call. °

## 2018-06-20 NOTE — Telephone Encounter (Signed)
Informed Dr. Curt Bears & Dr. Debara Pickett spoke. Informed that we will continue to monitor his aorta through yearly CT testing. Advised to start Imdur 30 mg once daily.  Reviewed medication w/ pt. Instructed to call the office if this does help improve chest discomfort and/or he starts having CP. Pt aware I will be in touch about f/u w/ Cam[place]. Patient verbalized understanding and agreeable to plan.

## 2018-06-20 NOTE — Telephone Encounter (Signed)
lmtcb

## 2018-07-01 ENCOUNTER — Telehealth: Payer: Self-pay | Admitting: Internal Medicine

## 2018-07-01 NOTE — Telephone Encounter (Signed)
New Message   Patient is calling to obtain information as to what he needs to do in reference to his enlarged aorta. Please call.

## 2018-07-01 NOTE — Telephone Encounter (Signed)
Returned call to patient he stated he has been having chest pain more often.He would also like appointment with Dr.Hilty to discuss recent chest ct.Virtual visit scheduled with Dr.Hilty 5/12 at 10:30 am.Patient gave permission to do a virtual visit.

## 2018-07-02 ENCOUNTER — Telehealth: Payer: Self-pay | Admitting: Internal Medicine

## 2018-07-02 ENCOUNTER — Encounter: Payer: Self-pay | Admitting: Internal Medicine

## 2018-07-02 ENCOUNTER — Telehealth (INDEPENDENT_AMBULATORY_CARE_PROVIDER_SITE_OTHER): Payer: Medicare HMO | Admitting: Internal Medicine

## 2018-07-02 ENCOUNTER — Other Ambulatory Visit (HOSPITAL_COMMUNITY): Payer: Self-pay | Admitting: Internal Medicine

## 2018-07-02 VITALS — BP 118/70 | HR 56 | Ht 70.0 in | Wt 175.0 lb

## 2018-07-02 DIAGNOSIS — I4819 Other persistent atrial fibrillation: Secondary | ICD-10-CM

## 2018-07-02 DIAGNOSIS — I712 Thoracic aortic aneurysm, without rupture, unspecified: Secondary | ICD-10-CM

## 2018-07-02 DIAGNOSIS — I739 Peripheral vascular disease, unspecified: Secondary | ICD-10-CM

## 2018-07-02 DIAGNOSIS — R079 Chest pain, unspecified: Secondary | ICD-10-CM | POA: Diagnosis not present

## 2018-07-02 DIAGNOSIS — I4892 Unspecified atrial flutter: Secondary | ICD-10-CM

## 2018-07-02 DIAGNOSIS — I2510 Atherosclerotic heart disease of native coronary artery without angina pectoris: Secondary | ICD-10-CM

## 2018-07-02 DIAGNOSIS — Z7189 Other specified counseling: Secondary | ICD-10-CM | POA: Diagnosis not present

## 2018-07-02 MED ORDER — ISOSORBIDE MONONITRATE ER 30 MG PO TB24: 60.0000 mg | ORAL_TABLET | Freq: Every day | ORAL | 11 refills | Status: DC

## 2018-07-02 NOTE — Progress Notes (Signed)
Virtual Visit via Video Note   This visit type was conducted due to national recommendations for restrictions regarding the COVID-19 Pandemic (e.g. social distancing) in an effort to limit this patient's exposure and mitigate transmission in our community.  Due to his co-morbid illnesses, this patient is at least at moderate risk for complications without adequate follow up.  This format is felt to be most appropriate for this patient at this time.  All issues noted in this document were discussed and addressed.  A limited physical exam was performed with this format.  Please refer to the patient's chart for his consent to telehealth for [facility].   Evaluation Performed:  Video follow-up for chest pain  Date:  07/02/2018   ID:  Carl Harper, DOB May 08, 1934, MRN [id number]  Patient Location:  [place] [address]  Provider location:   [address], [place], [place] [address]  PCP:  Bernerd Limbo, MD  Cardiologist:  Pixie Casino, MD Electrophysiologist:  None   Chief Complaint:  Chest pain, fatigue  History of Present Illness:    Carl Harper is a 84 y.o. male who presents via audio/video conferencing for a telehealth visit today.  Carl Harper is an 84 year old male with recent persistent atrial fibrillation/atrial flutter.  He was wearing a monitor which showed a burden of 99% and 1% burden of PVCs, after starting on mexiletine.  He has followed tenably with Dr. Curt Bears in cardiac electrophysiology.  During a recent visit with Benjaman Pott, FNP, he had complained of chest pain.  A repeat nuclear stress test was performed which showed a small fixed defect likely attenuation artifact or scar.  An echo was performed which showed normal LVEF and mild left atrial enlargement.  He reports persistent fatigue as well as chest pain.  He said in the past he had used nitro very infrequently, once or twice a year and a few weeks ago he had to take  nitroglycerin several times.  Since then however he has had no further chest pain symptoms.  Overall stress test was a low risk.  He is scheduled for follow-up telehealth visit with Dr. Curt Bears tomorrow.  It should also be noted he has a degree of diastolic hypertension with diastolic blood pressure around 50.  07/02/2018  Carl Harper was seen today in video follow-up for chest pain.  He reports despite starting Imdur that he has had no significant relief in chest pain.  Last Friday he required 4 doses of nitroglycerin however he did not have quick relief of his chest pain symptoms.  This suggest that his pain may not be anginal or could be very significant coronary disease not responsive to nitro.  I am favoring a noncardiac chest pain since he had a low risk Myoview stress test in March.  Nonetheless, he does have a coronary disease history.  He also was noted to have an enlarged aorta.  Interestingly, he had a CT chest in 2001, which demonstrated an ectatic aorta measuring up to 5 cm, but now it measures 4.8 cm almost 20 years later, suggesting that this is a very likely stable finding.  The patient does not have symptoms concerning for COVID-19 infection (fever, chills, cough, or new SHORTNESS OF BREATH).    Prior CV studies:   The following studies were reviewed today:  Echo (05/09/2018) Holter (04/23/2018) Lexiscan myoview (04/23/2018) CT aortogram  PMHx:  Past Medical History:  Diagnosis Date  . Benign neoplasm of colon 10/24/2006  .  BPH (benign prostatic hyperplasia)   . Daily headache   . Diverticulosis 09/03.2008  . Dysrhythmia    "irregular"  . Hypertension   . Internal hemorrhoids without mention of complication 96/22/2979  . Memory loss   . PAD (peripheral artery disease) ([place])   . Restless leg syndrome   . Sleep apnea    dx'd; wore mask; lost weight; turned in machine"    Past Surgical History:  Procedure Laterality Date  . APPENDECTOMY  ~ 1954  . ATHERECTOMY Right  11/07/2011   Procedure: ATHERECTOMY;  Surgeon: Lorretta Harp, MD;  Location: [facility] CATH LAB;  Service: Cardiovascular;  Laterality: Right;  . CARDIAC CATHETERIZATION  03/30/1999   non-critical CAD  . CATARACT EXTRACTION W/ INTRAOCULAR LENS  IMPLANT, BILATERAL  2011-2012  . ILIAC ARTERY STENT Right 11/07/2011   Diamondback orbital rotational atherectomy, PTA & stenting of calcified R CIA (Dr. Adora Fridge)  . LOWER EXTREMITY ANGIOGRAM N/A 11/07/2011   Procedure: LOWER EXTREMITY ANGIOGRAM;  Surgeon: Lorretta Harp, MD;  Location: [facility] CATH LAB;  Service: Cardiovascular;  Laterality: N/A;  . NM MYOCAR PERF WALL MOTION  09/2012   lexiscan myoview - low risk with fixed inferior defect with underlying bowel attenuation suggestive of artifact  . PERCUTANEOUS STENT INTERVENTION  11/07/2011   Procedure: PERCUTANEOUS STENT INTERVENTION;  Surgeon: Lorretta Harp, MD;  Location: [facility] CATH LAB;  Service: Cardiovascular;;  . SHOULDER ARTHROSCOPY W/ ROTATOR CUFF REPAIR Left 2010  . Sleep Study  01/09/2011   AHI during total sleep 14.4/hr and during REM 19.4/hr  . TRANSTHORACIC ECHOCARDIOGRAM  09/2012   EF 55-60%, LA mildly dilated    FAMHx:  Family History  Problem Relation Age of Onset  . Heart Problems Father 56  . Cancer Brother 34  . Heart failure Brother   . Cancer Maternal Grandfather        prostate    SOCHx:   reports that he quit smoking about 12 years ago. His smoking use included cigarettes. He has a 55.00 pack-year smoking history. He has never used smokeless tobacco. He reports current alcohol use of about 14.0 standard drinks of alcohol per week. He reports that he does not use drugs.  ALLERGIES:  Allergies  Allergen Reactions  . Amiodarone Other (See Comments)    Other reaction(s): Other (See Comments) Corneal deposits Corneal deposits  . Bee Venom Other (See Comments)    unknown    MEDS:  Current Meds  Medication Sig  . aspirin 81 MG tablet Take 81 mg by mouth daily.  Marland Kitchen ELIQUIS  5 MG TABS tablet TAKE ONE TABLET BY MOUTH TWICE A DAY  . EVENING PRIMROSE OIL PO Take 1,300 mg by mouth daily.  . finasteride (PROSCAR) 5 MG tablet Take 5 mg by mouth daily.  . furosemide (LASIX) 20 MG tablet Take 20 mg by mouth as needed.  . isosorbide mononitrate (IMDUR) 30 MG 24 hr tablet Take 2 tablets (60 mg total) by mouth daily.  Marland Kitchen losartan (COZAAR) 25 MG tablet TAKE ONE TABLET BY MOUTH DAILY  . LUTEIN PO Take 1 tablet by mouth daily.   . Magnesium 200 MG TABS Take 1 tablet (200 mg total) by mouth daily.  . metoprolol succinate (TOPROL-XL) 25 MG 24 hr tablet TAKE ONE TABLET BY MOUTH DAILY  . mexiletine (MEXITIL) 150 MG capsule Take 1 capsule (150 mg total) by mouth 2 (two) times daily.  . Multiple Vitamins-Minerals (PRESERVISION AREDS) CAPS Take 1 capsule by mouth daily.  Marland Kitchen MYRBETRIQ  50 MG TB24 tablet Take 1 tablet by mouth daily.  . nitroGLYCERIN (NITROSTAT) 0.4 MG SL tablet DISSOLVE 1 TAB UNDER TONGUE FOR CHEST PAIN - IF PAIN REMAINS AFTER 5 MIN, CALL 911 AND REPEAT DOSE. MAX 3 TABS IN 15 MINUTES  . pramipexole (MIRAPEX) 0.75 MG tablet Take 1 tablet by mouth daily.  . RESTASIS 0.05 % ophthalmic emulsion Apply 1 drop to eye 2 (two) times daily.  . Tamsulosin HCl (FLOMAX) 0.4 MG CAPS Take 0.4 mg by mouth daily after supper.  . traZODone (DESYREL) 100 MG tablet Take 100 mg by mouth at bedtime.  . vitamin B-12 (CYANOCOBALAMIN) 100 MCG tablet Take 100 mcg by mouth as needed.   . [DISCONTINUED] BIOTIN PO Take 1 tablet by mouth daily.   . [DISCONTINUED] furosemide (LASIX) 20 MG tablet Take 1 tablet (20 mg total) by mouth daily. (Patient taking differently: Take 20 mg by mouth as needed. )  . [DISCONTINUED] isosorbide mononitrate (IMDUR) 30 MG 24 hr tablet Take 1 tablet (30 mg total) by mouth daily.     ROS: Pertinent items noted in HPI and remainder of comprehensive ROS otherwise negative.  Labs/Other Tests and Data Reviewed:    Recent Labs: 04/05/2018: Hemoglobin 13.0; NT-Pro BNP 616;  Platelets 301 06/04/2018: BUN 22; Creatinine, Ser 1.31; Potassium 4.0; Sodium 138   Recent Lipid Panel Lab Results  Component Value Date/Time   CHOL 189 04/11/2013 10:58 AM   TRIG 59 04/11/2013 10:58 AM   HDL 77 04/11/2013 10:58 AM   LDLCALC 100 (H) 04/11/2013 10:58 AM    Wt Readings from Last 3 Encounters:  07/02/18 175 lb (79.4 kg)  06/04/18 174 lb (78.9 kg)  06/03/18 174 lb (78.9 kg)     Exam:    Vital Signs:  BP 118/70   Pulse (!) 56   Ht 5\' 10"  (1.778 m)   Wt 175 lb (79.4 kg)   BMI 25.11 kg/m    General appearance: alert, appears stated age and icteric Lungs: no audible wheezing or visualized difficulty breathing Extremities: extremities normal, atraumatic, no cyanosis or edema Skin: pale Neurologic: Mental status: Alert, oriented, thought content appropriate Psych: Pleasant, non-anxious  ASSESSMENT & PLAN:    1. Chest pain 2. Paroxysmal atrial flutter with frequent PVC's - CHADSVASC score of 4 on Eliquis- amiodarone discontinued due to corneal deposits 3. PVC's -suppressed with mexilitine 4. PAD status post diamondback were orbital atherectomy and stenting of the right common iliac artery 5. Hypertension 6. Dyslipidemia 7. Mild CAD 8. Obstructive sleep apnea on CPAP 9. DOE - negative treadmill stress test 10. Tremor/memory loss/gait abnormalities - no evidence of Parkinson's by recent neurology consult 11. Ascending aortic ectasia-4.8 cm (measured 5.0 cm in 2001)  Carl Harper continues to have chest pain which is in different locations.  Sometimes it is in the right upper chest and the right shoulder, and the left chest and has some atypical features as well.  Despite starting Imdur, his symptoms have not significantly improved.  He does say he gets some relief from short acting nitro although he has had to take up to 4 last Friday over a 24-hour time period, suggesting that they may not actually be helping.  His CT scan showed a dilated aorta  Plan follow-up  with me in the office in 1 month.  COVID-19 Education: The signs and symptoms of COVID-19 were discussed with the patient and how to seek care for testing (follow up with PCP or arrange E-visit).  The importance of social  distancing was discussed today.  Patient Risk:   After full review of this patients clinical status, I feel that they are at least moderate risk at this time.  Time:   Today, I have spent 25 minutes with the patient with telehealth technology discussing chest pain, afib/flutter. PVC's, antiarrythmic therapy, results of his stress test, echo and monitor.     Medication Adjustments/Labs and Tests Ordered: Current medicines are reviewed at length with the patient today.  Concerns regarding medicines are outlined above.   Tests Ordered: No orders of the defined types were placed in this encounter.   Medication Changes: Meds ordered this encounter  Medications  . isosorbide mononitrate (IMDUR) 30 MG 24 hr tablet    Sig: Take 2 tablets (60 mg total) by mouth daily.    Dispense:  60 tablet    Refill:  11    Dose increase    Disposition:  in 1 month(s)  Pixie Casino, MD, [facility], [place] Director of the Advanced Lipid Disorders &  Cardiovascular Risk Reduction Clinic Diplomate of the American Board of Clinical Lipidology Attending Cardiologist  Direct Dial: [phone]  Fax: [phone]  Website:  [email or web address]  Pixie Casino, MD  07/02/2018 10:47 AM

## 2018-07-02 NOTE — Patient Instructions (Signed)
Medication Instructions:  INCREASE isosorbide to 60mg  daily You can take 2 of your 30mg  tablets An updated prescription has been sent to the pharmacy  If you need a refill on your cardiac medications before your next appointment, please call your pharmacy.   Lab work: NONE If you have labs (blood work) drawn today and your tests are completely normal, you will receive your results only by: Marland Kitchen MyChart Message (if you have MyChart) OR . A paper copy in the mail If you have any lab test that is abnormal or we need to change your treatment, we will call you to review the results.  Testing/Procedures: NONE  Follow-Up: At [facility], you and your health needs are our priority.  As part of our continuing mission to provide you with exceptional heart care, we have created designated Provider Care Teams.  These Care Teams include your primary Cardiologist (physician) and Advanced Practice Providers (APPs -  Physician Assistants and Nurse Practitioners) who all work together to provide you with the care you need, when you need it. You will need a follow up appointment in 4 weeks in the office. You may see Pixie Casino, MD or one of the following Advanced Practice Providers on your designated Care Team: [place], [place] . Fabian Sharp, PA-C

## 2018-07-02 NOTE — Telephone Encounter (Signed)
Spoke with patient and reviewed e-visit instructions regarding med change. Notified him Rx was sent to pharmacy and he will receive a call to make an in-office appt with MD in about 1 month. He voiced understanding. No questions at this time.

## 2018-07-18 DIAGNOSIS — I7121 Aneurysm of the ascending aorta, without rupture: Secondary | ICD-10-CM | POA: Insufficient documentation

## 2018-07-31 ENCOUNTER — Other Ambulatory Visit: Payer: Self-pay | Admitting: Internal Medicine

## 2018-08-12 ENCOUNTER — Telehealth: Payer: Self-pay | Admitting: Internal Medicine

## 2018-08-12 NOTE — Telephone Encounter (Signed)
  Patient is calling because his heart rate was up to 83 and irregular yesterday. He fainted, had trouble breathing, felt weak overall, and HR was faster than normal also yesterday. He did take his isosorbide mononitrate (IMDUR) 30 MG 24 hr tablet yesterday morning but did not take it this morning because the symptoms he has is also the side effects for this medication. He did not take it this morning and he does feel better today.His heart rate has went down and so far he does not have any symptoms. Should he hold off on taking this medication or is there something else he can take?

## 2018-08-12 NOTE — Telephone Encounter (Signed)
Spoke with wife - had incident yesterday where he passed out, then had some difficulty breathing later in the day with elevated heart rate (up to 83), felt weak and lethargic all day yesterday.   Patient did not hurt himself - had controlled fall into recliner  BP in am yesterday was low side of normal (109/59), but later in the day was as high as 148/76.  Woke up today, feels fine.  Pt has med hx of atrial fibrillation, but did not feel this yesterday.  No chest pain today.    Advised that he restart the isosorbide tomorrow with just 30 mg daily instead of 60 mg.  If he starts to feel dizzy, lethargic and weak again after a few doses at 30 mg, they should call back to the office.

## 2018-08-12 NOTE — Telephone Encounter (Signed)
Patients wife is returning your call

## 2018-08-29 ENCOUNTER — Telehealth: Payer: Self-pay | Admitting: Internal Medicine

## 2018-08-29 NOTE — Telephone Encounter (Signed)
LM to remind patient of 09/02/2018 1:45pm appointment with Dr. Debara Pickett  When patient returns call, can either STAY VIRTUAL (phone or video) OR be seen IN OFFICE - patient preference.

## 2018-09-02 ENCOUNTER — Ambulatory Visit: Payer: Medicare HMO | Admitting: Internal Medicine

## 2018-09-02 ENCOUNTER — Other Ambulatory Visit: Payer: Self-pay

## 2018-09-02 ENCOUNTER — Encounter: Payer: Self-pay | Admitting: Internal Medicine

## 2018-09-02 VITALS — BP 115/65 | HR 61 | Ht 70.0 in | Wt 172.4 lb

## 2018-09-02 DIAGNOSIS — Z01812 Encounter for preprocedural laboratory examination: Secondary | ICD-10-CM

## 2018-09-02 DIAGNOSIS — I712 Thoracic aortic aneurysm, without rupture, unspecified: Secondary | ICD-10-CM

## 2018-09-02 DIAGNOSIS — R079 Chest pain, unspecified: Secondary | ICD-10-CM

## 2018-09-02 DIAGNOSIS — I4819 Other persistent atrial fibrillation: Secondary | ICD-10-CM | POA: Diagnosis not present

## 2018-09-02 NOTE — Patient Instructions (Addendum)
Medication Instructions:  Your physician recommends that you continue on your current medications as directed. Please refer to the Current Medication list given to you today.  If you need a refill on your cardiac medications before your next appointment, please call your pharmacy.   Lab work: BMET to be completed prior to CT If you have labs (blood work) drawn today and your tests are completely normal, you will receive your results only by: Marland Kitchen MyChart Message (if you have MyChart) OR . A paper copy in the mail If you have any lab test that is abnormal or we need to change your treatment, we will call you to review the results.  Testing/Procedures: Dr. Debara Pickett has ordered a CT study to assess your thoracic aortic aneurysm. This is done at [address] - [address] Floor. This should be scheduled October 2020.  Follow-Up: At [facility], you and your health needs are our priority.  As part of our continuing mission to provide you with exceptional heart care, we have created designated Provider Care Teams.  These Care Teams include your primary Cardiologist (physician) and Advanced Practice Providers (APPs -  Physician Assistants and Nurse Practitioners) who all work together to provide you with the care you need, when you need it. You will need a follow up appointment in 3-4 months.  Please call our office 2 months in advance to schedule this appointment.  You may see Pixie Casino, MD or one of the following Advanced Practice Providers on your designated Care Team: [place], [place] . Fabian Sharp, PA-C  Any Other Special Instructions Will Be Listed Below (If Applicable).

## 2018-09-02 NOTE — Progress Notes (Signed)
OFFICE NOTE  Chief Complaint:  No complaints  Primary Care Physician: Bernerd Limbo, MD  HPI:  Carl Harper is a pleasant 84 year old male previously followed Dr. Rollene Fare with a history of peripheral arterial disease. He underwent diamondback orbital rotational atherectomy by Dr. Gwenlyn Found in 2013 with stenting of the calcified right common iliac stenosis. He has mild nonobstructive coronary disease by cath in 2001 and a negative Myoview in 2011. He also has obstructive sleep apnea on CPAP has had reflux symptoms and atypical chest pain from time to time.  His echocardiogram does show mild concentric LVH and borderline aortic root dilatation with a possible small ascending aortic aneurysm which will need to be followed. At his last visit with Dr. Rollene Fare his Toprol was cut down to 25 mg alternating with 12.5 mg every 2 some bradycardia.   Carl Harper was recently seen by Dr. Percival Spanish in the office for shortness of breath and palpitations as well as exertional dyspnea. He wore the monitor however that was never performed as no monitors were available. He did undergo an exercise tolerance test for which he exercised for 6 minutes and 7 metabolic equivalents. There was no evidence of ischemia. He did have PVCs and a fairly flat blood pressure response to exercise. There was marked dyspnea with exertion. He also is reported recent tremors, some memory loss and instability with his gait.  Carl Harper returns today and is complaining of a sinking spell his chest. When pressed further he felt like he was possibly presyncopal occasionally has trouble getting his breath. He has some occasional dizziness. He was evaluated for monitor however none was placed because there was not a monitor available. He did have his metoprolol increased to 12.5/25 mg every other day. He seems to think that this is helped his symptoms and feels better than he had when he saw Dr. Percival Spanish.  I saw Carl Harper back in the  office today. He says that he call and make an appointment about a month ago because his been feeling some episodes of chest discomfort in the morning. He's also felt somewhat short of breath. An EKG in the office today demonstrates new onset atrial flutter with ventricular bigeminy at a rate of 77. This is a new diagnosis for him. I spent greater than 10 minutes discussing atrial fibrillation and anticoagulation options with him. He understands his options and the need for trying to establish a sinus rhythm.  Carl Harper returns today the office for follow-up. His EKG today fortunately shows sinus rhythm with PVCs. This is good news as we will not need to schedule him for a cardioversion. He has been taking Eliquis and feels that this is tolerable. He denies any bleeding problems. Here she does feel improvement in energy and I suspect this is from converting back to sinus rhythm at some point. Based on his TIA event, I suspect it may been related to paroxysmal atrial flutter. He did have carotid Dopplers which show very mild bilateral carotid disease and are not likely the source of his TIA event.  I saw Carl Harper back today in the office. He is maintaining sinus bradycardia with first degree AV block at a rate of 52. He denies any bleeding problems on Eliquis. He is on low-dose amiodarone. This was started by Dr. Curt Bears on 12/30/14, after monitor was placed which showed a very high burden of PVCs greater than 30% as well as A. fib. A repeat echo was performed which showed preserved LV  systolic function. [name] was symptomatic with his PVCs. He reports a marked reduction in his symptoms after starting the amiodarone. I do not see these had baseline pulmonary function testing, thyroid or liver function tests.  11/09/2015  Carl Harper returns today for follow-up. He has had recent worsening shortness of breath and significant fatigue and leg weakness with exercise. This is of fairly recent finding.  He was seen by Dr. Curt Bears, who noted that recently [name] ophthalmologist had recommended discontinuing amiodarone due to corneal deposits. Since that time, he has been having a washout of the amiodarone with the plan to reassess his burden of PVCs by monitoring at the end of September. This is a ready been scheduled. His last echo was in 2016 and showed normal LV function, but he has had new symptoms as described above and would likely benefit from a repeat echo to rule out cardiomyopathy.  02/04/2016  Carl Harper returns today for follow-up. He has been seen by Dr. Curt Bears for evaluation of his a-fib. He was on amiodarone, but we discontinued it due to corneal deposits. He has maintained sinus rhythm without recurrent atrial fibrillation. PVCs were noted and noted to be significant and he was started on mexiletine. Since that he's had some improvement in his symptoms although was noted to have some PVCs today. He has a follow-up with Dr. Curt Bears in a few months. We repeated an echocardiogram which showed normal systolic and diastolic function with mild left atrial enlargement.  08/10/2016  Carl Harper returns today. He reports his palpitations have improved significantly with the addition of mexiletine. He is seen Dr. Curt Bears with cardiac electrophysiology for this. Blood pressure is well-controlled today 102/52. EKG which is personally reviewed shows sinus bradycardia with marked sinus arrhythmia and first-degree AV block at 53. QTC is 377 ms with a left anterior fascicular block and first-degree AV block.   02/05/2017  Carl Harper was seen today in follow-up.  Overall is without complaints.  He denies any significant palpitations, shortness of breath, chest pain or lower extremity claudication.  He is been successfully treated on mexiletine and although he continues to have some PVCs, the frequency and burden have decreased significantly.  Blood pressure is at goal.  EKG was personally  reviewed today which is sinus rhythm, first-degree AV block and occasional PVCs, left anterior fascicular block and QTC 413 ms.  09/11/2017  Carl Harper returns today for follow-up.  Again he is doing well without complaints.  He denies any palpitations or significant arrhythmias.  He denies any chest pain or worsening shortness of breath.  He occasionally gets some fatigue with working out in the hot weather for long periods of time.  This may be related to some low normal blood pressure.  He is using Lasix as needed.  Blood pressure today was 108/58 however he generally says that his blood pressures around 614 systolic.  11/28/2017  Carl Harper is seen today in follow-up.  He seems to be doing well.  He denies any recurrent atrial fibrillation.  He is tolerating Eliquis without bleeding issues.  Blood pressures well controlled today 121/60.  He denies any PVCs and he is compliant with mexiletine and metoprolol.  EKG shows sinus bradycardia with first-degree AV block, nonspecific IVCD with QTC of 411 ms.  His only other concern is he has some right leg pain.  He does have a history of PAD.  He says sometimes this is worse with exertion and improves at rest but at other  times he gets a pain in his right hip when laying in bed, which is more concerning for a bursitis or possibly osteoarthritis.  09/02/2018  Carl Harper is seen today for follow-up.  He he had to virtual visit since I last saw him in the office.  He had some chest pain and underwent a CT of the chest because of a dilated aorta.  This showed aortic ectasia measuring between 4.8 and 5 cm.  The previous 5 cm measurement was in 2001 however his most recent CT measurement was less significant.  There was no evidence of dissection.  I had increased his Imdur however he reduced it to 30 mg daily.  He notes that he is only taking sublingual nitro about once a week.  In general he is fairly comfortable.  His EKG incidentally did show atrial  fibrillation today which is rate controlled.  He is on mexiletine and Eliquis.  PMHx:  Past Medical History:  Diagnosis Date  . Benign neoplasm of colon 10/24/2006  . BPH (benign prostatic hyperplasia)   . Daily headache   . Diverticulosis 09/03.2008  . Dysrhythmia    "irregular"  . Hypertension   . Internal hemorrhoids without mention of complication 32/20/2542  . Memory loss   . PAD (peripheral artery disease) ([place])   . Restless leg syndrome   . Sleep apnea    dx'd; wore mask; lost weight; turned in machine"    Past Surgical History:  Procedure Laterality Date  . APPENDECTOMY  ~ 1954  . ATHERECTOMY Right 11/07/2011   Procedure: ATHERECTOMY;  Surgeon: Lorretta Harp, MD;  Location: [facility] CATH LAB;  Service: Cardiovascular;  Laterality: Right;  . CARDIAC CATHETERIZATION  03/30/1999   non-critical CAD  . CATARACT EXTRACTION W/ INTRAOCULAR LENS  IMPLANT, BILATERAL  2011-2012  . ILIAC ARTERY STENT Right 11/07/2011   Diamondback orbital rotational atherectomy, PTA & stenting of calcified R CIA (Dr. Adora Fridge)  . LOWER EXTREMITY ANGIOGRAM N/A 11/07/2011   Procedure: LOWER EXTREMITY ANGIOGRAM;  Surgeon: Lorretta Harp, MD;  Location[facility] C[facility] LAB;  Service: Cardiovascular;  Laterality: N/A;  . NM MYOCAR PERF WALL MOTION  09/2012   lexiscan myoview - low risk with fixed inferior defect with underlying bowel attenuation suggestive of artifact  . PERCUTANEOUS STENT INTERVENTION  11/07/2011   Procedure: PERCUTANEOUS STENT INTERVENTION;  Surgeon: Lorretta Harp, MD;  Location: [facility] CATH LAB;  Service: Cardiovascular;;  . SHOULDER ARTHROSCOPY W/ ROTATOR CUFF REPAIR Left 2010  . Sleep Study  01/09/2011   AHI during total sleep 14.4/hr and during REM 19.4/hr  . TRANSTHORACIC ECHOCARDIOGRAM  09/2012   EF 55-60%, LA mildly dilated    FAMHx:  Family History  Problem Relation Age of Onset  . Heart Problems Father 53  . Cancer Brother 86  . Heart failure Brother   . Cancer Maternal Grandfather         prostate    SOCHx:   reports that he quit smoking about 12 years ago. His smoking use included cigarettes. He has a 55.00 pack-year smoking history. He has never used smokeless tobacco. He reports current alcohol use of about 14.0 standard drinks of alcohol per week. He reports that he does not use drugs.  ALLERGIES:  Allergies  Allergen Reactions  . Amiodarone Other (See Comments)    Other reaction(s): Other (See Comments) Corneal deposits Corneal deposits  . Bee Venom Other (See Comments)    unknown    ROS: Pertinent items noted in HPI and  remainder of comprehensive ROS otherwise negative.  HOME MEDS: Current Outpatient Medications  Medication Sig Dispense Refill  . aspirin 81 MG tablet Take 81 mg by mouth daily.    Marland Kitchen ELIQUIS 5 MG TABS tablet TAKE ONE TABLET BY MOUTH TWICE A DAY 180 tablet 3  . EVENING PRIMROSE OIL PO Take 1,300 mg by mouth daily.    . finasteride (PROSCAR) 5 MG tablet Take 5 mg by mouth daily.    . furosemide (LASIX) 20 MG tablet Take 20 mg by mouth as needed.    . isosorbide mononitrate (IMDUR) 30 MG 24 hr tablet Take 2 tablets (60 mg total) by mouth daily. (Patient taking differently: Take 30 mg by mouth daily. ) 60 tablet 11  . losartan (COZAAR) 25 MG tablet TAKE ONE TABLET BY MOUTH DAILY 90 tablet 3  . LUTEIN PO Take 1 tablet by mouth daily.     . Magnesium 200 MG TABS Take 1 tablet (200 mg total) by mouth daily. 30 each 4  . metoprolol succinate (TOPROL-XL) 25 MG 24 hr tablet TAKE 1 TABLET BY MOUTH DAILY 90 tablet 3  . mexiletine (MEXITIL) 150 MG capsule Take 1 capsule (150 mg total) by mouth 2 (two) times daily. 180 capsule 3  . Multiple Vitamins-Minerals (PRESERVISION AREDS) CAPS Take 1 capsule by mouth daily.    Marland Kitchen MYRBETRIQ 50 MG TB24 tablet Take 1 tablet by mouth daily.    . nitroGLYCERIN (NITROSTAT) 0.4 MG SL tablet DISSOLVE 1 TAB UNDER TONGUE FOR CHEST PAIN - IF PAIN REMAINS AFTER 5 MIN, CALL 911 AND REPEAT DOSE. MAX 3 TABS IN 15 MINUTES 25  tablet 2  . pramipexole (MIRAPEX) 0.75 MG tablet Take 1 tablet by mouth daily.    . RESTASIS 0.05 % ophthalmic emulsion Apply 1 drop to eye 2 (two) times daily.    . Tamsulosin HCl (FLOMAX) 0.4 MG CAPS Take 0.4 mg by mouth daily after supper.    . traZODone (DESYREL) 100 MG tablet Take 100 mg by mouth at bedtime.  3  . vitamin B-12 (CYANOCOBALAMIN) 100 MCG tablet Take 100 mcg by mouth as needed.      No current facility-administered medications for this visit.     LABS/IMAGING: No results found for this or any previous visit (from the past 48 hour(s)). No results found.  VITALS: BP 115/65   Pulse 61   Ht 5\' 10"  (1.778 m)   Wt 172 lb 6.4 oz (78.2 kg)   SpO2 97%   BMI 24.74 kg/m   EXAM: General appearance: alert and no distress Neck: no carotid bruit, no JVD and thyroid not enlarged, symmetric, no tenderness/mass/nodules Lungs: clear to auscultation bilaterally Heart: regular rate and rhythm, S1, S2 normal, no murmur, click, rub or gallop Abdomen: soft, non-tender; bowel sounds normal; no masses,  no organomegaly Extremities: extremities normal, atraumatic, no cyanosis or edema Pulses: 2+ and symmetric Skin: Skin color, texture, turgor normal. No rashes or lesions Neurologic: Grossly normal Psych: Pleasant  EKG: A. fib at 61, incomplete left bundle branch block, ST and T wave changes-personally reviewed  ASSESSMENT: 1. Paroxysmal afib/flutter with frequent PVC's - CHADSVASC score of 4 on Eliquis- amiodarone discontinued due to corneal deposits 2. PVC's -suppressed with mexilitine 3. PAD status post diamondback were orbital atherectomy and stenting of the right common iliac artery 4. Hypertension 5. Dyslipidemia 6. Mild CAD 7. Obstructive sleep apnea on CPAP 8. DOE - negative treadmill stress test 9. Tremor/memory loss/gait abnormalities - no evidence of Parkinson's by recent  neurology consult 10. A sending aortic aneurysm measuring 4.8 cm (5.0 cm measurement in  2001)  PLAN: 1.   Carl Harper has recently had some chest pain however his symptoms have improved on Imdur and is only taking nitroglycerin once a week.  He does have an aortic aneurysm which is a sending measuring 4.8 cm.  A repeat CT scan is recommended in 6 months however he did have evidence of aortic dilatation to 5 cm in 2001 therefore suspect this will be stable.  Plan follow-up with me at that time.  Pixie Casino, MD, [employer], [place] Director of the [facility]  [facility] Attending Cardiologist  Direct Dial: [phone]  Fax: [phone]  Website:  [email or web address] 09/02/2018, 2:18 PM

## 2018-09-05 LAB — BASIC METABOLIC PANEL
BUN/Creatinine Ratio: 18 (ref 10–24)
BUN: 26 mg/dL (ref 8–27)
CO2: 23 mmol/L (ref 20–29)
Calcium: 9.2 mg/dL (ref 8.6–10.2)
Chloride: 105 mmol/L (ref 96–106)
Creatinine, Ser: 1.41 mg/dL — ABNORMAL HIGH (ref 0.76–1.27)
GFR calc Af Amer: 53 mL/min/{1.73_m2} — ABNORMAL LOW (ref >59)
GFR calc non Af Amer: 46 mL/min/{1.73_m2} — ABNORMAL LOW (ref >59)
Glucose: 91 mg/dL (ref 65–99)
Potassium: 4.7 mmol/L (ref 3.5–5.2)
Sodium: 141 mmol/L (ref 134–144)

## 2018-09-19 ENCOUNTER — Encounter: Payer: Self-pay | Admitting: Internal Medicine

## 2018-10-03 ENCOUNTER — Other Ambulatory Visit: Payer: Medicare HMO

## 2018-11-11 ENCOUNTER — Telehealth: Payer: Self-pay | Admitting: Internal Medicine

## 2018-11-11 NOTE — Telephone Encounter (Signed)
Routed to EP provider

## 2018-11-11 NOTE — Telephone Encounter (Signed)
New message   Patient states that pharmacy states that mexiletine (MEXITIL) 150 MG capsule is no longer available the patient needs a substitute for this medication. Please call.

## 2018-11-13 MED ORDER — MEXILETINE HCL 250 MG PO CAPS: 250.0000 mg | ORAL_CAPSULE | Freq: Two times a day (BID) | ORAL | 3 refills | Status: DC

## 2018-11-13 NOTE — Telephone Encounter (Signed)
Called and advised with patient that a message was sent to EP provider.  Advised I would route a message to his nurse, patient states that he has one day left and they sold him all they had previously.  Thank you!

## 2018-11-13 NOTE — Telephone Encounter (Signed)
Advised to increase Mexiletine to 250 mg BID per Dr. Curt Bears. Patient verbalized understanding and agreeable to plan.

## 2018-11-13 NOTE — Telephone Encounter (Signed)
New Message   Patient calling back in please give patient a call.

## 2018-11-14 ENCOUNTER — Telehealth: Payer: Self-pay | Admitting: Internal Medicine

## 2018-11-14 DIAGNOSIS — Z01812 Encounter for preprocedural laboratory examination: Secondary | ICD-10-CM

## 2018-11-14 NOTE — Telephone Encounter (Signed)
Semi-annual CT angiogram chest/aorta scheduled on 12/09/2018  Patient needs BMET prior (ordered) and completed before test Patient advised on this and that he can come to [facility] lab location for non-fasting blood work

## 2018-11-14 NOTE — Telephone Encounter (Signed)
New Message     Patient scheduled to have Ct angio of chest 12/09/18 and needs to have labs ordered.  Please call patient to let him know when he can come in to have labs done.

## 2018-11-16 LAB — BASIC METABOLIC PANEL
BUN/Creatinine Ratio: 17 (ref 10–24)
BUN: 23 mg/dL (ref 8–27)
CO2: 23 mmol/L (ref 20–29)
Calcium: 9.4 mg/dL (ref 8.6–10.2)
Chloride: 101 mmol/L (ref 96–106)
Creatinine, Ser: 1.34 mg/dL — ABNORMAL HIGH (ref 0.76–1.27)
GFR calc Af Amer: 57 mL/min/{1.73_m2} — ABNORMAL LOW (ref >59)
GFR calc non Af Amer: 49 mL/min/{1.73_m2} — ABNORMAL LOW (ref >59)
Glucose: 105 mg/dL — ABNORMAL HIGH (ref 65–99)
Potassium: 4.3 mmol/L (ref 3.5–5.2)
Sodium: 139 mmol/L (ref 134–144)

## 2018-11-24 ENCOUNTER — Other Ambulatory Visit: Payer: Self-pay | Admitting: Internal Medicine

## 2018-12-09 ENCOUNTER — Other Ambulatory Visit: Payer: Self-pay

## 2018-12-09 ENCOUNTER — Ambulatory Visit (INDEPENDENT_AMBULATORY_CARE_PROVIDER_SITE_OTHER)
Admission: RE | Admit: 2018-12-09 | Discharge: 2018-12-09 | Disposition: A | Discharge status: Home / Self Care | Payer: Medicare HMO | Source: Ambulatory Visit | Attending: Internal Medicine | Admitting: Internal Medicine

## 2018-12-09 DIAGNOSIS — I712 Thoracic aortic aneurysm, without rupture, unspecified: Secondary | ICD-10-CM

## 2018-12-09 MED ORDER — IOHEXOL 350 MG/ML SOLN
100.0000 mL | Freq: Once | INTRAVENOUS | Status: AC | PRN
  Administered 2018-12-09: 15:00:00 100 mL via INTRAVENOUS

## 2018-12-10 ENCOUNTER — Ambulatory Visit (HOSPITAL_BASED_OUTPATIENT_CLINIC_OR_DEPARTMENT_OTHER)
Admission: RE | Admit: 2018-12-10 | Discharge: 2018-12-10 | Disposition: A | Discharge status: Home / Self Care | Payer: Medicare HMO | Source: Ambulatory Visit | Attending: Internal Medicine | Admitting: Internal Medicine

## 2018-12-10 ENCOUNTER — Ambulatory Visit (HOSPITAL_COMMUNITY)
Admission: RE | Admit: 2018-12-10 | Discharge: 2018-12-10 | Disposition: A | Discharge status: Home / Self Care | Payer: Medicare HMO | Source: Ambulatory Visit | Attending: Cardiology | Admitting: Cardiology

## 2018-12-10 ENCOUNTER — Other Ambulatory Visit (HOSPITAL_COMMUNITY): Payer: Self-pay | Admitting: Internal Medicine

## 2018-12-10 DIAGNOSIS — I739 Peripheral vascular disease, unspecified: Secondary | ICD-10-CM | POA: Insufficient documentation

## 2018-12-10 DIAGNOSIS — I771 Stricture of artery: Secondary | ICD-10-CM | POA: Insufficient documentation

## 2018-12-10 DIAGNOSIS — Z95828 Presence of other vascular implants and grafts: Secondary | ICD-10-CM

## 2018-12-13 ENCOUNTER — Telehealth: Payer: Self-pay | Admitting: Internal Medicine

## 2018-12-13 DIAGNOSIS — I712 Thoracic aortic aneurysm, without rupture, unspecified: Secondary | ICD-10-CM

## 2018-12-13 NOTE — Telephone Encounter (Signed)
  Advised patient, verbalized understanding.  Future order placed and message sent to scheduling.   Notes recorded by Fidel Levy, RN on 12/11/2018 at 10:19 AM EDT  LMTCB on [phone] (Mobile) *Preferred*  ------   Notes recorded by Pixie Casino, MD on 12/09/2018 at 3:33 PM EDT  Stable aneurysm - repeat CT in 6 months.

## 2018-12-13 NOTE — Telephone Encounter (Signed)
New Message   Patient is returning call in reference to CT results can be reached at [phone] or [phone].

## 2019-01-06 ENCOUNTER — Ambulatory Visit: Payer: Medicare HMO | Admitting: Internal Medicine

## 2019-01-06 ENCOUNTER — Other Ambulatory Visit: Payer: Self-pay

## 2019-01-06 ENCOUNTER — Encounter: Payer: Self-pay | Admitting: Internal Medicine

## 2019-01-06 VITALS — BP 96/47 | HR 62 | Temp 97.0°F | Ht 70.0 in | Wt 174.0 lb

## 2019-01-06 DIAGNOSIS — I712 Thoracic aortic aneurysm, without rupture, unspecified: Secondary | ICD-10-CM

## 2019-01-06 DIAGNOSIS — I4819 Other persistent atrial fibrillation: Secondary | ICD-10-CM | POA: Diagnosis not present

## 2019-01-06 DIAGNOSIS — I739 Peripheral vascular disease, unspecified: Secondary | ICD-10-CM

## 2019-01-06 DIAGNOSIS — R5383 Other fatigue: Secondary | ICD-10-CM | POA: Diagnosis not present

## 2019-01-06 NOTE — Patient Instructions (Signed)
Medication Instructions:  DECREASE metoprolol succinate to 12.5mg  daily  Check BP & pulse at home. Call with readings in 1 week  *If you need a refill on your cardiac medications before your next appointment, please call your pharmacy*   Follow-Up: At [facility], you and your health needs are our priority.  As part of our continuing mission to provide you with exceptional heart care, we have created designated Provider Care Teams.  These Care Teams include your primary Cardiologist (physician) and Advanced Practice Providers (APPs -  Physician Assistants and Nurse Practitioners) who all work together to provide you with the care you need, when you need it.  Your next appointment:   6 months - after CT  The format for your next appointment:   In Person  Provider:   K. Mali Hilty, MD  Other Instructions

## 2019-01-06 NOTE — Progress Notes (Signed)
OFFICE NOTE  Chief Complaint:  Fatigue, weakness  Primary Care Physician: Bernerd Limbo, MD  HPI:  Carl Harper is a pleasant 84 year old male previously followed Dr. Rollene Fare with a history of peripheral arterial disease. He underwent diamondback orbital rotational atherectomy by Dr. Gwenlyn Found in 2013 with stenting of the calcified right common iliac stenosis. He has mild nonobstructive coronary disease by cath in 2001 and a negative Myoview in 2011. He also has obstructive sleep apnea on CPAP has had reflux symptoms and atypical chest pain from time to time.  His echocardiogram does show mild concentric LVH and borderline aortic root dilatation with a possible small ascending aortic aneurysm which will need to be followed. At his last visit with Dr. Rollene Fare his Toprol was cut down to 25 mg alternating with 12.5 mg every 2 some bradycardia.   Carl Harper was recently seen by Dr. Percival Spanish in the office for shortness of breath and palpitations as well as exertional dyspnea. He wore the monitor however that was never performed as no monitors were available. He did undergo an exercise tolerance test for which he exercised for 6 minutes and 7 metabolic equivalents. There was no evidence of ischemia. He did have PVCs and a fairly flat blood pressure response to exercise. There was marked dyspnea with exertion. He also is reported recent tremors, some memory loss and instability with his gait.  Carl Harper returns today and is complaining of a sinking spell his chest. When pressed further he felt like he was possibly presyncopal occasionally has trouble getting his breath. He has some occasional dizziness. He was evaluated for monitor however none was placed because there was not a monitor available. He did have his metoprolol increased to 12.5/25 mg every other day. He seems to think that this is helped his symptoms and feels better than he had when he saw Dr. Percival Spanish.  I saw Carl Harper back in the  office today. He says that he call and make an appointment about a month ago because his been feeling some episodes of chest discomfort in the morning. He's also felt somewhat short of breath. An EKG in the office today demonstrates new onset atrial flutter with ventricular bigeminy at a rate of 77. This is a new diagnosis for him. I spent greater than 10 minutes discussing atrial fibrillation and anticoagulation options with him. He understands his options and the need for trying to establish a sinus rhythm.  Carl Harper returns today the office for follow-up. His EKG today fortunately shows sinus rhythm with PVCs. This is good news as we will not need to schedule him for a cardioversion. He has been taking Eliquis and feels that this is tolerable. He denies any bleeding problems. Here she does feel improvement in energy and I suspect this is from converting back to sinus rhythm at some point. Based on his TIA event, I suspect it may been related to paroxysmal atrial flutter. He did have carotid Dopplers which show very mild bilateral carotid disease and are not likely the source of his TIA event.  I saw Carl Harper back today in the office. He is maintaining sinus bradycardia with first degree AV block at a rate of 52. He denies any bleeding problems on Eliquis. He is on low-dose amiodarone. This was started by Dr. Curt Bears on 12/30/14, after monitor was placed which showed a very high burden of PVCs greater than 30% as well as A. fib. A repeat echo was performed which showed preserved LV  systolic function. Carl Harper was symptomatic with his PVCs. He reports a marked reduction in his symptoms after starting the amiodarone. I do not see these had baseline pulmonary function testing, thyroid or liver function tests.  11/09/2015  Carl Harper returns today for follow-up. He has had recent worsening shortness of breath and significant fatigue and leg weakness with exercise. This is of fairly recent finding.  He was seen by Dr. Curt Bears, who noted that recently [name] ophthalmologist had recommended discontinuing amiodarone due to corneal deposits. Since that time, he has been having a washout of the amiodarone with the plan to reassess his burden of PVCs by monitoring at the end of September. This is a ready been scheduled. His last echo was in 2016 and showed normal LV function, but he has had new symptoms as described above and would likely benefit from a repeat echo to rule out cardiomyopathy.  02/04/2016  Carl Harper returns today for follow-up. He has been seen by Dr. Curt Bears for evaluation of his a-fib. He was on amiodarone, but we discontinued it due to corneal deposits. He has maintained sinus rhythm without recurrent atrial fibrillation. PVCs were noted and noted to be significant and he was started on mexiletine. Since that he's had some improvement in his symptoms although was noted to have some PVCs today. He has a follow-up with Dr. Curt Bears in a few months. We repeated an echocardiogram which showed normal systolic and diastolic function with mild left atrial enlargement.  08/10/2016  Carl Harper returns today. He reports his palpitations have improved significantly with the addition of mexiletine. He is seen Dr. Curt Bears with cardiac electrophysiology for this. Blood pressure is well-controlled today 102/52. EKG which is personally reviewed shows sinus bradycardia with marked sinus arrhythmia and first-degree AV block at 53. QTC is 377 ms with a left anterior fascicular block and first-degree AV block.   02/05/2017  Carl Harper was seen today in follow-up.  Overall is without complaints.  He denies any significant palpitations, shortness of breath, chest pain or lower extremity claudication.  He is been successfully treated on mexiletine and although he continues to have some PVCs, the frequency and burden have decreased significantly.  Blood pressure is at goal.  EKG was personally  reviewed today which is sinus rhythm, first-degree AV block and occasional PVCs, left anterior fascicular block and QTC 413 ms.  09/11/2017  Carl Harper returns today for follow-up.  Again he is doing well without complaints.  He denies any palpitations or significant arrhythmias.  He denies any chest pain or worsening shortness of breath.  He occasionally gets some fatigue with working out in the hot weather for long periods of time.  This may be related to some low normal blood pressure.  He is using Lasix as needed.  Blood pressure today was 108/58 however he generally says that his blood pressures around 614 systolic.  11/28/2017  Carl Harper is seen today in follow-up.  He seems to be doing well.  He denies any recurrent atrial fibrillation.  He is tolerating Eliquis without bleeding issues.  Blood pressures well controlled today 121/60.  He denies any PVCs and he is compliant with mexiletine and metoprolol.  EKG shows sinus bradycardia with first-degree AV block, nonspecific IVCD with QTC of 411 ms.  His only other concern is he has some right leg pain.  He does have a history of PAD.  He says sometimes this is worse with exertion and improves at rest but at other  times he gets a pain in his right hip when laying in bed, which is more concerning for a bursitis or possibly osteoarthritis.  09/02/2018  Carl Harper is seen today for follow-up.  He he had to virtual visit since I last saw him in the office.  He had some chest pain and underwent a CT of the chest because of a dilated aorta.  This showed aortic ectasia measuring between 4.8 and 5 cm.  The previous 5 cm measurement was in 2001 however his most recent CT measurement was less significant.  There was no evidence of dissection.  I had increased his Imdur however he reduced it to 30 mg daily.  He notes that he is only taking sublingual nitro about once a week.  In general he is fairly comfortable.  His EKG incidentally did show atrial  fibrillation today which is rate controlled.  He is on mexiletine and Eliquis.  01/06/2019  Carl Harper returns for follow-up.  He is complaining of some fatigue and weakness.  He just had a repeat CT which showed stable aortic ectasia measuring 4.8 cm.  He had a 5 cm aorta noted in 2001 as well.  I suspect this will continue to be stable.  He does report some fatigue and leg weakness.  Blood pressure was noted to be low today.  He is also an bradycardic response with his A. Fib.  PMHx:  Past Medical History:  Diagnosis Date  . Benign neoplasm of colon 10/24/2006  . BPH (benign prostatic hyperplasia)   . Daily headache   . Diverticulosis 09/03.2008  . Dysrhythmia    "irregular"  . Hypertension   . Internal hemorrhoids without mention of complication [id number]  . Memory loss   . PAD (peripheral artery disease) ([place])   . Restless leg syndrome   . Sleep apnea    dx'd; wore mask; lost weight; turned in machine"    Past Surgical History:  Procedure Laterality Date  . APPENDECTOMY  ~ 1954  . ATHERECTOMY Right 11/07/2011   Procedure: ATHERECTOMY;  Surgeon: Lorretta Harp, MD;  Location: [facility] CATH LAB;  Service: Cardiovascular;  Laterality: Right;  . CARDIAC CATHETERIZATION  03/30/1999   non-critical CAD  . CATARACT EXTRACTION W/ INTRAOCULAR LENS  IMPLANT, BILATERAL  2011-2012  . ILIAC ARTERY STENT Right 11/07/2011   Diamondback orbital rotational atherectomy, PTA & stenting of calcified R CIA (Dr. Adora Fridge)  . LOWER EXTREMITY ANGIOGRAM N/A 11/07/2011   Procedure: LOWER EXTREMITY ANGIOGRAM;  Surgeon: Lorretta Harp, MD;  Location: [facility] CATH LAB;  Service: Cardiovascular;  Laterality: N/A;  . NM MYOCAR PERF WALL MOTION  09/2012   lexiscan myoview - low risk with fixed inferior defect with underlying bowel attenuation suggestive of artifact  . PERCUTANEOUS STENT INTERVENTION  11/07/2011   Procedure: PERCUTANEOUS STENT INTERVENTION;  Surgeon: Lorretta Harp, MD;  Location: [facility] CATH LAB;   Service: Cardiovascular;;  . SHOULDER ARTHROSCOPY W/ ROTATOR CUFF REPAIR Left 2010  . Sleep Study  01/09/2011   AHI during total sleep 14.4/hr and during REM 19.4/hr  . TRANSTHORACIC ECHOCARDIOGRAM  09/2012   EF 55-60%, LA mildly dilated    FAMHx:  Family History  Problem Relation Age of Onset  . Heart Problems Father 11  . Cancer Brother 72  . Heart failure Brother   . Cancer Maternal Grandfather        prostate    SOCHx:   reports that he quit smoking about 12 years ago. His smoking use included  cigarettes. He has a 55.00 pack-year smoking history. He has never used smokeless tobacco. He reports current alcohol use of about 14.0 standard drinks of alcohol per week. He reports that he does not use drugs.  ALLERGIES:  Allergies  Allergen Reactions  . Amiodarone Other (See Comments)    Other reaction(s): Other (See Comments) Corneal deposits Corneal deposits  . Bee Venom Other (See Comments)    unknown    ROS: Pertinent items noted in HPI and remainder of comprehensive ROS otherwise negative.  HOME MEDS: Current Outpatient Medications  Medication Sig Dispense Refill  . aspirin 81 MG tablet Take 81 mg by mouth daily.    Marland Kitchen ELIQUIS 5 MG TABS tablet TAKE ONE TABLET BY MOUTH TWICE A DAY 180 tablet 3  . EVENING PRIMROSE OIL PO Take 1,300 mg by mouth daily.    . finasteride (PROSCAR) 5 MG tablet Take 5 mg by mouth daily.    . furosemide (LASIX) 20 MG tablet Take 20 mg by mouth as needed.    . isosorbide mononitrate (IMDUR) 30 MG 24 hr tablet Take 2 tablets (60 mg total) by mouth daily. (Patient taking differently: Take 30 mg by mouth daily. ) 60 tablet 11  . losartan (COZAAR) 25 MG tablet TAKE ONE TABLET BY MOUTH DAILY 90 tablet 3  . LUTEIN PO Take 1 tablet by mouth daily.     . Magnesium 200 MG TABS Take 1 tablet (200 mg total) by mouth daily. 30 each 4  . metoprolol succinate (TOPROL-XL) 25 MG 24 hr tablet TAKE 1 TABLET BY MOUTH DAILY 90 tablet 3  . mexiletine (MEXITIL) 250  MG capsule Take 1 capsule (250 mg total) by mouth 2 (two) times daily. 60 capsule 3  . Multiple Vitamins-Minerals (PRESERVISION AREDS) CAPS Take 1 capsule by mouth daily.    Marland Kitchen MYRBETRIQ 50 MG TB24 tablet Take 1 tablet by mouth daily.    . nitroGLYCERIN (NITROSTAT) 0.4 MG SL tablet DISSOLVE 1 TAB UNDER TONGUE FOR CHEST PAIN - IF PAIN REMAINS AFTER 5 MIN, CALL 911 AND REPEAT DOSE. MAX 3 TABS IN 15 MINUTES 25 tablet 1  . pramipexole (MIRAPEX) 0.75 MG tablet Take 1 tablet by mouth daily.    . RESTASIS 0.05 % ophthalmic emulsion Apply 1 drop to eye 2 (two) times daily.    . Tamsulosin HCl (FLOMAX) 0.4 MG CAPS Take 0.4 mg by mouth daily after supper.    . traZODone (DESYREL) 100 MG tablet Take 100 mg by mouth at bedtime.  3  . vitamin B-12 (CYANOCOBALAMIN) 100 MCG tablet Take 100 mcg by mouth as needed.      No current facility-administered medications for this visit.     LABS/IMAGING: No results found for this or any previous visit (from the past 48 hour(s)). No results found.  VITALS: BP (!) 96/47   Pulse 62   Temp (!) 97 F (36.1 C)   Ht 5\' 10"  (1.778 m)   Wt 174 lb (78.9 kg)   SpO2 99%   BMI 24.97 kg/m   EXAM: General appearance: alert and no distress Neck: no carotid bruit, no JVD and thyroid not enlarged, symmetric, no tenderness/mass/nodules Lungs: clear to auscultation bilaterally Heart: irregularly irregular rhythm Abdomen: soft, non-tender; bowel sounds normal; no masses,  no organomegaly Extremities: edema Trace ankle edema bilaterally Pulses: 2+ and symmetric Skin: Skin color, texture, turgor normal. No rashes or lesions Neurologic: Grossly normal Psych: Pleasant  EKG: A. fib with slow ventricular response at 59-personally reviewed  ASSESSMENT:  1. Paroxysmal afib/flutter with frequent PVC's - CHADSVASC score of 4 on Eliquis- amiodarone discontinued due to corneal deposits 2. PVC's -suppressed with mexilitine 3. PAD status post diamondback were orbital atherectomy  and stenting of the right common iliac artery 4. Hypertension 5. Dyslipidemia 6. Mild CAD 7. Obstructive sleep apnea- not on CPAP 8. DOE - negative treadmill stress test 9. Tremor/memory loss/gait abnormalities - no evidence of Parkinson's by recent neurology consult 10. Ascending aortic aneurysm measuring 4.8 cm (5.0 cm measurement in 2001)  PLAN: 1.   Mr. Holzknecht has reported some increase in fatigue and noted to have low blood pressure today.  I would recommend decreasing his Toprol-XL to 12.5 mg daily.  He should monitor blood pressure and heart rate at home we may need to consider decreasing his ARB as well.  Although he has PAD, his right common iliac was stented and is patent by recent Dopplers.  There is no AAA.  His ascending thoracic aortic aneurysm is stable at 4.8 cm we will plan a repeat CT in 6 months.  Follow-up with me afterwards.  Pixie Casino, MD, [facility], [place] Director of the Advanced Lipid Disorders &  Cardiovascular Risk Reduction Clinic Attending Cardiologist  Direct Dial: [phone]  Fax: [phone]  Website:  [email or web address] [email or web address] Colandra Ohanian 01/06/2019, 1:35 PM

## 2019-03-03 ENCOUNTER — Telehealth: Payer: Self-pay | Admitting: Internal Medicine

## 2019-03-03 NOTE — Telephone Encounter (Signed)
PA request for mexiletine received Per CMM "The patient currently has access to the requested medication and a Prior Authorization is not needed for the patient/medication."

## 2019-03-11 ENCOUNTER — Other Ambulatory Visit: Payer: Self-pay | Admitting: Internal Medicine

## 2019-03-11 NOTE — Telephone Encounter (Signed)
Rx(s) sent to pharmacy electronically.  

## 2019-03-13 NOTE — Telephone Encounter (Signed)
Letter received from [facility] stating that they did approve the pts Mexiltine PA. Approval good from 02/21/2019 until 02/20/2020. Member ID#: [id number]  Will forward to Dr Rod Mae office as Garnette Czech

## 2019-03-14 ENCOUNTER — Ambulatory Visit: Payer: Medicare HMO

## 2019-03-14 ENCOUNTER — Ambulatory Visit: Payer: Medicare HMO | Attending: Internal Medicine

## 2019-03-14 DIAGNOSIS — Z23 Encounter for immunization: Secondary | ICD-10-CM | POA: Insufficient documentation

## 2019-03-14 NOTE — Progress Notes (Signed)
   Covid-19 Vaccination Clinic  Name:  [name]    MRN: [id number] DOB: [birth year]  03/14/2019  [name] was observed post Covid-19 immunization for 15 minutes without incidence. He was provided with Vaccine Information Sheet and instruction to access the V-Safe system.   [name] was instructed to call 911 with any severe reactions post vaccine: Marland Kitchen Difficulty breathing  . Swelling of your face and throat  . A fast heartbeat  . A bad rash all over your body  . Dizziness and weakness    Immunizations Administered    Name Date Dose VIS Date Route   Pfizer COVID-19 Vaccine 03/14/2019  4:06 PM 0.3 mL 01/31/2019 Intramuscular   Manufacturer: [place]   Lot: [id number]   [place]: [id number]

## 2019-03-25 ENCOUNTER — Other Ambulatory Visit: Payer: Self-pay | Admitting: Internal Medicine

## 2019-03-25 ENCOUNTER — Other Ambulatory Visit: Payer: Self-pay | Admitting: Cardiology

## 2019-04-04 ENCOUNTER — Ambulatory Visit: Payer: Medicare HMO | Attending: Internal Medicine

## 2019-04-04 DIAGNOSIS — Z23 Encounter for immunization: Secondary | ICD-10-CM | POA: Insufficient documentation

## 2019-04-04 NOTE — Progress Notes (Signed)
   Covid-19 Vaccination Clinic  Name:  MARLEY GREULICH    MRN: [id number] DOB: 08-29-1934  04/04/2019  Mr. Quezada was observed post Covid-19 immunization for 15 minutes without incidence. He was provided with Vaccine Information Sheet and instruction to access the V-Safe system.   Mr. Schwent was instructed to call 911 with any severe reactions post vaccine: Marland Kitchen Difficulty breathing  . Swelling of your face and throat  . A fast heartbeat  . A bad rash all over your body  . Dizziness and weakness    Immunizations Administered    Name Date Dose VIS Date Route   Pfizer COVID-19 Vaccine 04/04/2019 12:33 PM 0.3 mL 01/31/2019 Intramuscular   Manufacturer: [place]   Lot: [id number]   [place]: [id number]

## 2019-04-08 ENCOUNTER — Other Ambulatory Visit: Payer: Self-pay | Admitting: Internal Medicine

## 2019-05-14 ENCOUNTER — Other Ambulatory Visit: Payer: Self-pay | Admitting: Internal Medicine

## 2019-05-14 DIAGNOSIS — Z01812 Encounter for preprocedural laboratory examination: Secondary | ICD-10-CM

## 2019-06-06 ENCOUNTER — Other Ambulatory Visit: Payer: Self-pay | Admitting: Internal Medicine

## 2019-06-06 ENCOUNTER — Other Ambulatory Visit: Payer: Self-pay

## 2019-06-06 DIAGNOSIS — Z01812 Encounter for preprocedural laboratory examination: Secondary | ICD-10-CM

## 2019-06-07 LAB — BASIC METABOLIC PANEL
BUN/Creatinine Ratio: 19 (ref 10–24)
BUN: 24 mg/dL (ref 8–27)
CO2: 24 mmol/L (ref 20–29)
Calcium: 9.1 mg/dL (ref 8.6–10.2)
Chloride: 106 mmol/L (ref 96–106)
Creatinine, Ser: 1.25 mg/dL (ref 0.76–1.27)
GFR calc Af Amer: 61 mL/min/{1.73_m2} (ref >59)
GFR calc non Af Amer: 53 mL/min/{1.73_m2} — ABNORMAL LOW (ref >59)
Glucose: 95 mg/dL (ref 65–99)
Potassium: 4.5 mmol/L (ref 3.5–5.2)
Sodium: 142 mmol/L (ref 134–144)

## 2019-06-10 ENCOUNTER — Ambulatory Visit (INDEPENDENT_AMBULATORY_CARE_PROVIDER_SITE_OTHER)
Admission: RE | Admit: 2019-06-10 | Discharge: 2019-06-10 | Disposition: A | Discharge status: Home / Self Care | Payer: Medicare HMO | Source: Ambulatory Visit | Attending: Internal Medicine | Admitting: Internal Medicine

## 2019-06-10 ENCOUNTER — Other Ambulatory Visit: Payer: Self-pay

## 2019-06-10 DIAGNOSIS — I712 Thoracic aortic aneurysm, without rupture, unspecified: Secondary | ICD-10-CM

## 2019-06-10 MED ORDER — IOHEXOL 350 MG/ML SOLN
100.0000 mL | Freq: Once | INTRAVENOUS | Status: AC | PRN
  Administered 2019-06-10: 100 mL via INTRAVENOUS

## 2019-06-23 ENCOUNTER — Other Ambulatory Visit: Payer: Self-pay | Admitting: Internal Medicine

## 2019-06-23 ENCOUNTER — Other Ambulatory Visit: Payer: Self-pay | Admitting: Cardiology

## 2019-06-24 ENCOUNTER — Telehealth: Payer: Self-pay

## 2019-06-24 ENCOUNTER — Encounter: Payer: Self-pay | Admitting: Physician Assistant

## 2019-06-24 ENCOUNTER — Telehealth (INDEPENDENT_AMBULATORY_CARE_PROVIDER_SITE_OTHER): Payer: Medicare HMO | Admitting: Physician Assistant

## 2019-06-24 VITALS — BP 153/77 | HR 62 | Ht 69.0 in | Wt 174.0 lb

## 2019-06-24 DIAGNOSIS — I739 Peripheral vascular disease, unspecified: Secondary | ICD-10-CM

## 2019-06-24 DIAGNOSIS — Z01810 Encounter for preprocedural cardiovascular examination: Secondary | ICD-10-CM

## 2019-06-24 DIAGNOSIS — I712 Thoracic aortic aneurysm, without rupture, unspecified: Secondary | ICD-10-CM

## 2019-06-24 DIAGNOSIS — E785 Hyperlipidemia, unspecified: Secondary | ICD-10-CM

## 2019-06-24 DIAGNOSIS — I2510 Atherosclerotic heart disease of native coronary artery without angina pectoris: Secondary | ICD-10-CM

## 2019-06-24 DIAGNOSIS — R079 Chest pain, unspecified: Secondary | ICD-10-CM | POA: Diagnosis not present

## 2019-06-24 DIAGNOSIS — I10 Essential (primary) hypertension: Secondary | ICD-10-CM

## 2019-06-24 DIAGNOSIS — G459 Transient cerebral ischemic attack, unspecified: Secondary | ICD-10-CM

## 2019-06-24 NOTE — Progress Notes (Signed)
Virtual Visit via Telephone Note   This visit type was conducted due to national recommendations for restrictions regarding the COVID-19 Pandemic (e.g. social distancing) in an effort to limit this patient's exposure and mitigate transmission in our community.  Due to his co-morbid illnesses, this patient is at least at moderate risk for complications without adequate follow up.  This format is felt to be most appropriate for this patient at this time.  The patient did not have access to video technology/had technical difficulties with video requiring transitioning to audio format only (telephone).  All issues noted in this document were discussed and addressed.  No physical exam could be performed with this format.  Please refer to the patient's chart for his  consent to telehealth for [facility].   The patient was identified using 2 identifiers.  Date:  06/26/2019   ID:  Carl Harper, DOB [birth year], MRN [id number]  Patient Location: Home Provider Location: Home  PCP:  Bernerd Limbo, MD  Cardiologist:  Pixie Casino, MD  Electrophysiologist:  None   Evaluation Performed:  Follow-Up Visit  Chief Complaint:  Chest pain  History of Present Illness:    Carl Harper is a 85 y.o. male with PMH of PAD, CAD, TAA, persistent atrial fibrillation, hyperlipidemia, hypertension, RLS, obstructive sleep apnea and TIA.  Previous cardiac catheterization in 2001 showed mild nonobstructive CAD.  Myoview in 2011 was normal.  He underwent atherectomy by Dr. Gwenlyn Found in 2013 with stent placed in calcified right common iliac artery. He was diagnosed with atrial flutter/atrial fibrillation in 2016 and was referred to Dr. Curt Bears.  Atrial flutter was managed using amiodarone therapy and anticoagulation therapy.  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  He is on mexiletine for frequent PVCs. Last Myoview obtained on 04/23/2018 was low risk study with small defect of mild severity present in the  apical lateral location which is nonreversible and consistent with diaphragmatic attenuation versus focal scar.  Last echocardiogram obtained on 05/09/2018 showed EF 55 to 60%, moderate to severe dilatation of the ascending aorta measuring at 50 mm.  He was last seen by Dr. Debara Pickett in November 2020, blood pressure was low, therefore Toprol-XL was reduced to 12.5 mg daily.  Most recent CT angiogram of the chest obtained on 06/10/2019 showed a stable ascending thoracic aorta measuring at 49 mm in diameter.   Patient presents today for cardiology office visit.  In the past several weeks, he has been having intermittent chest pain.  This occurs both at rest and with exertion.  I recommend a Myoview.  He is also due for repeat CT angiogram of the aorta.  Otherwise he denies any lower extremity edema, orthopnea or PND.   The patient does not have symptoms concerning for COVID-19 infection (fever, chills, cough, or new shortness of breath).    Past Medical History:  Diagnosis Date  . Benign neoplasm of colon 10/24/2006  . BPH (benign prostatic hyperplasia)   . Daily headache   . Diverticulosis 09/03.2008  . Dysrhythmia    "irregular"  . Hypertension   . Internal hemorrhoids without mention of complication [id number]  . Memory loss   . PAD (peripheral artery disease) ([place])   . Restless leg syndrome   . Sleep apnea    dx'd; wore mask; lost weight; turned in machine"   Past Surgical History:  Procedure Laterality Date  . APPENDECTOMY  ~ 1954  . ATHERECTOMY Right 11/07/2011   Procedure: ATHERECTOMY;  Surgeon: Lorretta Harp, MD;  Location: [facility] CATH LAB;  Service: Cardiovascular;  Laterality: Right;  . CARDIAC CATHETERIZATION  03/30/1999   non-critical CAD  . CATARACT EXTRACTION W/ INTRAOCULAR LENS  IMPLANT, BILATERAL  2011-2012  . ILIAC ARTERY STENT Right 11/07/2011   [place] orbital rotational atherectomy, PTA & stenting of calcified R CIA (Dr. Adora Fridge)  . LOWER EXTREMITY ANGIOGRAM N/A  11/07/2011   Procedure: LOWER EXTREMITY ANGIOGRAM;  Surgeon: Lorretta Harp, MD;  Location: [facility] C[facility] LAB;  Service: Cardiovascular;  Laterality: N/A;  . NM MYOCAR PERF WALL MOTION  09/2012   lexiscan myoview - low risk with fixed inferior defect with underlying bowel attenuation suggestive of artifact  . PERCUTANEOUS STENT INTERVENTION  11/07/2011   Procedure: PERCUTANEOUS STENT INTERVENTION;  Surgeon: Lorretta Harp, MD;  Location: [facility] CATH LAB;  Service: Cardiovascular;;  . SHOULDER ARTHROSCOPY W/ ROTATOR CUFF REPAIR Left 2010  . Sleep Study  01/09/2011   AHI during total sleep 14.4/hr and during REM 19.4/hr  . TRANSTHORACIC ECHOCARDIOGRAM  09/2012   EF 55-60%, LA mildly dilated     Current Meds  Medication Sig  . aspirin 81 MG tablet Take 81 mg by mouth daily.  Marland Kitchen ELIQUIS 5 MG TABS tablet TAKE ONE TABLET BY MOUTH TWICE A DAY  . EVENING PRIMROSE OIL PO Take 1,300 mg by mouth daily.  . finasteride (PROSCAR) 5 MG tablet Take 5 mg by mouth daily.  . furosemide (LASIX) 20 MG tablet Take 20 mg by mouth as needed.  . isosorbide mononitrate (IMDUR) 30 MG 24 hr tablet Take 2 tablets (60 mg total) by mouth daily.  Marland Kitchen losartan (COZAAR) 25 MG tablet TAKE ONE TABLET BY MOUTH DAILY  . LUTEIN PO Take 1 tablet by mouth daily.   . Magnesium 200 MG TABS Take 1 tablet (200 mg total) by mouth daily.  . metoprolol succinate (TOPROL-XL) 25 MG 24 hr tablet Take 12.5 mg by mouth daily.  Marland Kitchen mexiletine (MEXITIL) 250 MG capsule TAKE ONE CAPSULE BY MOUTH TWICE A DAY  . Multiple Vitamins-Minerals (PRESERVISION AREDS) CAPS Take 2 capsules by mouth daily.   Marland Kitchen MYRBETRIQ 50 MG TB24 tablet Take 1 tablet by mouth daily.  . nitroGLYCERIN (NITROSTAT) 0.4 MG SL tablet DISSOLVE 1 TAB UNDER TONGUE FOR CHEST PAIN - IF PAIN REMAINS AFTER 5 MIN, CALL 911 AND REPEAT DOSE. MAX 3 TABS IN 15 MINUTES  . pramipexole (MIRAPEX) 0.75 MG tablet Take 1 tablet by mouth daily.  . RESTASIS 0.05 % ophthalmic emulsion Apply 1 drop to eye 2 (two)  times daily.  . Tamsulosin HCl (FLOMAX) 0.4 MG CAPS Take 0.4 mg by mouth daily after supper.  . traZODone (DESYREL) 100 MG tablet Take 100 mg by mouth at bedtime.  . vitamin B-12 (CYANOCOBALAMIN) 100 MCG tablet Take 100 mcg by mouth as needed.      Allergies:   Amiodarone and Bee venom   Social History   Tobacco Use  . Smoking status: Former Smoker    Packs/day: 1.00    Years: 55.00    Pack years: 55.00    Types: Cigarettes    Quit date: 06/24/1994    Years since quitting: 25.0  . Smokeless tobacco: Never Used  Substance Use Topics  . Alcohol use: Yes    Alcohol/week: 14.0 standard drinks    Types: 14 Glasses of wine per week    Comment: 11/07/2011 "couple drinks of wine q night"  . Drug use: No     Family Hx: The patient's family history includes Cancer  in his maternal grandfather; Cancer (age of onset: 35) in his brother; Heart Problems (age of onset: 80) in his father; Heart failure in his brother.  ROS:   Please see the history of present illness.     All other systems reviewed and are negative.   Prior CV studies:   The following studies were reviewed today:  Myoview 04/23/2018  There was no ST segment deviation noted during stress.  There is a small defect of mild severity present in the apical lateral location. The defect is non-reversible and consistent with diaphragmatic attenuation artifact vs. Focal scar. Recommend 2D echo to assess wall motion in this area. Study was not gated due to atrial flutter.  Images not gated due to underlying atrial flutter.  Consider 2D echo to assess for wall motion abnormality in the apical lateral wall.  This is a low risk study.   Echo 05/09/2018 1. The left ventricle has normal systolic function, with an ejection  fraction of 55-60%. The cavity size was normal. NA due to atrial flutter.  No evidence of left ventricular regional wall motion abnormalities.  2. The right ventricle has normal systolic function. The cavity was   normal. There is no increase in right ventricular wall thickness.  3. The aortic valve is tricuspid Mild thickening of the aortic valve Mild  calcification of the aortic valve. Aortic valve regurgitation was not  assessed by color flow Doppler.  4. Partial restriction at left and noncoronary commissure.  5. There is moderate to severe dilatation of the ascending aorta  measuring 50 mm.    Labs/Other Tests and Data Reviewed:    EKG:  An ECG dated 01/06/2019 was personally reviewed today and demonstrated:  Atrial fibrillation, rate controlled.  Recent Labs: 06/06/2019: BUN 24; Creatinine, Ser 1.25; Potassium 4.5; Sodium 142   Recent Lipid Panel Lab Results  Component Value Date/Time   CHOL 189 04/11/2013 10:58 AM   TRIG 59 04/11/2013 10:58 AM   HDL 77 04/11/2013 10:58 AM   LDLCALC 100 (H) 04/11/2013 10:58 AM    Wt Readings from Last 3 Encounters:  06/24/19 174 lb (78.9 kg)  01/06/19 174 lb (78.9 kg)  09/02/18 172 lb 6.4 oz (78.2 kg)     Objective:    Vital Signs:  BP (!) 153/77   Pulse 62   Ht 5\' 9"  (1.753 m)   Wt 174 lb (78.9 kg)   BMI 25.70 kg/m    VITAL SIGNS:  reviewed  ASSESSMENT & PLAN:    1. Chest pain: Patient has been having some recent chest discomfort that lasted for few minutes.  This does not correlate with exertion.  Recommend Myoview  2. CAD: See #1.  Previous cardiac catheterization in 2001 showed mild nonobstructive CAD.  Last Myoview in March 2020 was low risk.  Continue aspirin  3. Longstanding persistent atrial fibrillation: On Eliquis.  Rate controlled on metoprolol  4. Thoracic aortic aneurysm: Plan for repeat CTA of the chest aorta.  5. PAD: Previously underwent arthrectomy by Dr. Gwenlyn Found in 2013 with stent placement in right common iliac artery.  Denies any recent claudication symptoms.  6. Hypertension: Continue on current therapy  7. Hyperlipidemia: Annual lipid panel followed by primary care provider.  For some reason patient is not  on any statin therapy.  Consider addition of statin medication on next follow-up.  8. Frequent PVCs: Continue mexiletine  9. history of TIA: No recent recurrence  COVID-19 Education: The signs and symptoms of COVID-19 were discussed with  the patient and how to seek care for testing (follow up with PCP or arrange E-visit).  The importance of social distancing was discussed today.  Time:   Today, I have spent 10 minutes with the patient with telehealth technology discussing the above problems.     Medication Adjustments/Labs and Tests Ordered: Current medicines are reviewed at length with the patient today.  Concerns regarding medicines are outlined above.   Tests Ordered: Orders Placed This Encounter  Procedures  . CT ANGIO CHEST AORTA W/CM & OR WO/CM  . Basic metabolic panel  . MYOCARDIAL PERFUSION IMAGING    Medication Changes: No orders of the defined types were placed in this encounter.   Follow Up:  Either In Person or Virtual in 6 month(s)  Signed, Almyra Deforest, [place]  06/26/2019 11:09 PM    [place]

## 2019-06-24 NOTE — Patient Instructions (Addendum)
Medication Instructions:  Your physician recommends that you continue on your current medications as directed. Please refer to the Current Medication list given to you today.  *If you need a refill on your cardiac medications before your next appointment, please call your pharmacy*  Lab Work: Your physician recommends that you return for lab work in [place] at least 1 week prior to your :   BMET  If you have labs (blood work) drawn today and your tests are completely normal, you will receive your results only by: Marland Kitchen MyChart Message (if you have MyChart) OR . A paper copy in the mail If you have any lab test that is abnormal or we need to change your treatment, we will call you to review the results.   Testing/Procedures: Your physician has requested that you have a lexiscan myoview. For further information please visit [email or web address]. Please follow instruction sheet, as given.   Please schedule within the next 2 weeks   Non-Cardiac CT scanning, (CAT scanning), is a noninvasive, special x-ray that produces cross-sectional images of the body using x-rays and a computer. CT scans help physicians diagnose and treat medical conditions. For some CT exams, a contrast material is used to enhance visibility in the area of the body being studied. CT scans provide greater clarity and reveal more details than regular x-ray exams.   Please schedule for 6 months   Follow-Up: At [facility], you and your health needs are our priority.  As part of our continuing mission to provide you with exceptional heart care, we have created designated Provider Care Teams.  These Care Teams include your primary Cardiologist (physician) and Advanced Practice Providers (APPs -  Physician Assistants and Nurse Practitioners) who all work together to provide you with the care you need, when you need it.  Your next appointment:   6 month(s)  The format for your next appointment:   In Person  Provider:   K.  Mali Hilty, MD  Other Instructions

## 2019-06-24 NOTE — Telephone Encounter (Signed)
  Patient Consent for Virtual Visit         Carl Harper has provided verbal consent on 06/24/2019 for a virtual visit (video or telephone).   CONSENT FOR VIRTUAL VISIT FOR:  Carl Harper  By participating in this virtual visit I agree to the following:  I hereby voluntarily request, consent and authorize [facility] and its employed or contracted physicians, physician assistants, nurse practitioners or other licensed health care professionals (the Practitioner), to provide me with telemedicine health care services (the "Services") as deemed necessary by the treating Practitioner. I acknowledge and consent to receive the Services by the Practitioner via telemedicine. I understand that the telemedicine visit will involve communicating with the Practitioner through live audiovisual communication technology and the disclosure of certain medical information by electronic transmission. I acknowledge that I have been given the opportunity to request an in-person assessment or other available alternative prior to the telemedicine visit and am voluntarily participating in the telemedicine visit.  I understand that I have the right to withhold or withdraw my consent to the use of telemedicine in the course of my care at any time, without affecting my right to future care or treatment, and that the Practitioner or I may terminate the telemedicine visit at any time. I understand that I have the right to inspect all information obtained and/or recorded in the course of the telemedicine visit and may receive copies of available information for a reasonable fee.  I understand that some of the potential risks of receiving the Services via telemedicine include:  Marland Kitchen Delay or interruption in medical evaluation due to technological equipment failure or disruption; . Information transmitted may not be sufficient (e.g. poor resolution of images) to allow for appropriate medical decision making by the  Practitioner; and/or  . In rare instances, security protocols could fail, causing a breach of personal health information.  Furthermore, I acknowledge that it is my responsibility to provide information about my medical history, conditions and care that is complete and accurate to the best of my ability. I acknowledge that Practitioner's advice, recommendations, and/or decision may be based on factors not within their control, such as incomplete or inaccurate data provided by me or distortions of diagnostic images or specimens that may result from electronic transmissions. I understand that the practice of medicine is not an exact science and that Practitioner makes no warranties or guarantees regarding treatment outcomes. I acknowledge that a copy of this consent can be made available to me via my patient portal ([facility]), or I can request a printed copy by calling the office of [facility].    I understand that my insurance will be billed for this visit.   I have read or had this consent read to me. . I understand the contents of this consent, which adequately explains the benefits and risks of the Services being provided via telemedicine.  . I have been provided ample opportunity to ask questions regarding this consent and the Services and have had my questions answered to my satisfaction. . I give my informed consent for the services to be provided through the use of telemedicine in my medical care

## 2019-06-25 ENCOUNTER — Telehealth (HOSPITAL_COMMUNITY): Payer: Self-pay

## 2019-06-25 NOTE — Telephone Encounter (Signed)
Encounter complete. 

## 2019-06-26 ENCOUNTER — Encounter: Payer: Self-pay | Admitting: Physician Assistant

## 2019-06-27 ENCOUNTER — Other Ambulatory Visit: Payer: Self-pay

## 2019-06-27 ENCOUNTER — Ambulatory Visit (HOSPITAL_COMMUNITY)
Admission: RE | Admit: 2019-06-27 | Discharge: 2019-06-27 | Disposition: A | Discharge status: Home / Self Care | Payer: Medicare HMO | Source: Ambulatory Visit | Attending: Cardiology | Admitting: Cardiology

## 2019-06-27 DIAGNOSIS — R079 Chest pain, unspecified: Secondary | ICD-10-CM | POA: Insufficient documentation

## 2019-06-27 LAB — MYOCARDIAL PERFUSION IMAGING
Peak HR: 76 {beats}/min
Rest HR: 52 {beats}/min
SDS: 3
SRS: 1
SSS: 4
TID: 1.23

## 2019-06-27 MED ORDER — TECHNETIUM TC 99M TETROFOSMIN IV KIT
30.2000 | PACK | Freq: Once | INTRAVENOUS | Status: AC | PRN
  Administered 2019-06-27: 30.2 via INTRAVENOUS
  Filled 2019-06-27: qty 31

## 2019-06-27 MED ORDER — REGADENOSON 0.4 MG/5ML IV SOLN
0.4000 mg | Freq: Once | INTRAVENOUS | Status: AC
  Administered 2019-06-27: 0.4 mg via INTRAVENOUS

## 2019-06-27 MED ORDER — TECHNETIUM TC 99M TETROFOSMIN IV KIT
10.4000 | PACK | Freq: Once | INTRAVENOUS | Status: AC | PRN
  Administered 2019-06-27: 10.4 via INTRAVENOUS
  Filled 2019-06-27: qty 11

## 2019-07-01 ENCOUNTER — Telehealth: Payer: Self-pay | Admitting: Physician Assistant

## 2019-07-01 NOTE — Telephone Encounter (Signed)
New message   Patient's wife wants to know why a CT has been ordered. Please call to discuss.

## 2019-07-01 NOTE — Telephone Encounter (Signed)
Called and spoke with pts wife. Notified that per the office note from [facility] and the avs, the CT angio should be a 6 month follow up. Pts wife states that [facility] called and was trying to schedule them for May 25th so they were confused. Wife states she canceled that appt. She was thankful for the clarification and had no other questions at this time.

## 2019-07-03 ENCOUNTER — Telehealth: Payer: Self-pay

## 2019-07-03 NOTE — Telephone Encounter (Addendum)
Left a voice message for he patient to give me a call to go over his stress test results.   ----- Message from [place], [place] sent at 07/02/2019  2:39 PM EDT ----- Low risk stress test, continue to monitor chest discomfort, but the stress test result is reassuring

## 2019-07-11 NOTE — Telephone Encounter (Signed)
Thank you, that is correct, the CT is in 6 month

## 2019-07-15 ENCOUNTER — Other Ambulatory Visit: Payer: Medicare HMO

## 2019-07-23 ENCOUNTER — Other Ambulatory Visit: Payer: Self-pay | Admitting: Internal Medicine

## 2019-10-01 ENCOUNTER — Other Ambulatory Visit: Payer: Self-pay | Admitting: Internal Medicine

## 2019-10-10 ENCOUNTER — Telehealth: Payer: Self-pay | Admitting: Internal Medicine

## 2019-10-10 NOTE — Telephone Encounter (Signed)
LVM for patient to return call to get follow up scheduled with Hilty from recall list

## 2019-10-28 ENCOUNTER — Encounter (HOSPITAL_COMMUNITY): Payer: Self-pay | Admitting: Emergency Medicine

## 2019-10-28 ENCOUNTER — Emergency Department (HOSPITAL_COMMUNITY)
Admission: EM | Admit: 2019-10-28 | Discharge: 2019-10-28 | Disposition: A | Discharge status: Home / Self Care | Payer: Medicare HMO | Attending: Emergency Medicine | Admitting: Emergency Medicine

## 2019-10-28 ENCOUNTER — Ambulatory Visit: Payer: Medicare HMO | Admitting: Internal Medicine

## 2019-10-28 ENCOUNTER — Telehealth: Payer: Self-pay | Admitting: Internal Medicine

## 2019-10-28 DIAGNOSIS — R531 Weakness: Secondary | ICD-10-CM | POA: Insufficient documentation

## 2019-10-28 DIAGNOSIS — Z5321 Procedure and treatment not carried out due to patient leaving prior to being seen by health care provider: Secondary | ICD-10-CM | POA: Insufficient documentation

## 2019-10-28 LAB — URINALYSIS, ROUTINE W REFLEX MICROSCOPIC
Bilirubin Urine: NEGATIVE
Glucose, UA: NEGATIVE mg/dL
Ketones, ur: 5 mg/dL — AB
Leukocytes,Ua: NEGATIVE
Nitrite: NEGATIVE
Protein, ur: 100 mg/dL — AB
Specific Gravity, Urine: 1.025 (ref 1.005–1.030)
pH: 5 (ref 5.0–8.0)

## 2019-10-28 LAB — CBC
HCT: 43.4 % (ref 39.0–52.0)
Hemoglobin: 14 g/dL (ref 13.0–17.0)
MCH: 29.7 pg (ref 26.0–34.0)
MCHC: 32.3 g/dL (ref 30.0–36.0)
MCV: 91.9 fL (ref 80.0–100.0)
Platelets: 208 10*3/uL (ref 150–400)
RBC: 4.72 MIL/uL (ref 4.22–5.81)
RDW: 13.3 % (ref 11.5–15.5)
WBC: 7.9 10*3/uL (ref 4.0–10.5)
nRBC: 0 % (ref 0.0–0.2)

## 2019-10-28 LAB — BASIC METABOLIC PANEL
Anion gap: 11 (ref 5–15)
BUN: 28 mg/dL — ABNORMAL HIGH (ref 8–23)
CO2: 23 mmol/L (ref 22–32)
Calcium: 9 mg/dL (ref 8.9–10.3)
Chloride: 107 mmol/L (ref 98–111)
Creatinine, Ser: 1.57 mg/dL — ABNORMAL HIGH (ref 0.61–1.24)
GFR calc Af Amer: 47 mL/min — ABNORMAL LOW (ref >60)
GFR calc non Af Amer: 40 mL/min — ABNORMAL LOW (ref >60)
Glucose, Bld: 106 mg/dL — ABNORMAL HIGH (ref 70–99)
Potassium: 4.2 mmol/L (ref 3.5–5.1)
Sodium: 141 mmol/L (ref 135–145)

## 2019-10-28 NOTE — Telephone Encounter (Signed)
Spoke to patient's wife.She stated husband has been having chest pain off and on for the past 3 days.Stated he has took NTG each day with relief.Stated this morning he woke up and was not able to stand.He is having weakness in both legs.He is still unable to stand.No pain in legs just very weak.No slurred speech.Stated he is still in bed.Stated he was able to walk yesterday.The weakness in legs is new.She is going to call 911 and take him to ED to be evaluated.I will make Dr.Hilty aware.

## 2019-10-28 NOTE — Telephone Encounter (Signed)
Pt c/o of Chest Pain: STAT if CP now or developed within 24 hours  1. Are you having CP right now? no  2. Are you experiencing any other symptoms (ex. SOB, nausea, vomiting, sweating)? no  3. How long have you been experiencing CP? Since yesterday morning.   4. Is your CP continuous or coming and going? Comes and goes  5. Have you taken Nitroglycerin? Took nitroglycerin yesterday  Wife states that he can't walk now. He was able to walk a little bit this morning. I took him an hour to get out of bed, went to the bathroom, wife was able to get him to the chair.  ?

## 2019-10-28 NOTE — ED Notes (Signed)
Pt name called multiple times no response

## 2019-10-28 NOTE — Telephone Encounter (Signed)
I agree - something else is going on, doesn't sound particularly cardiac - concern for stroke possibly.  Dr Lemmie Evens

## 2019-10-28 NOTE — ED Triage Notes (Signed)
Pt endorses generalized weakness for a few days. Denies any pain or SOB. No recent falls.

## 2019-10-30 ENCOUNTER — Other Ambulatory Visit: Payer: Self-pay | Admitting: Internal Medicine

## 2019-11-05 ENCOUNTER — Encounter: Payer: Self-pay | Admitting: Oncology

## 2019-11-05 ENCOUNTER — Other Ambulatory Visit: Payer: Self-pay | Admitting: Oncology

## 2019-11-05 DIAGNOSIS — U071 COVID-19: Secondary | ICD-10-CM

## 2019-11-05 DIAGNOSIS — Z8616 Personal history of COVID-19: Secondary | ICD-10-CM | POA: Insufficient documentation

## 2019-11-05 NOTE — Progress Notes (Signed)
I connected by phone with  Carl Harper  to discuss the potential use of an new treatment for mild to moderate COVID-19 viral infection in non-hospitalized patients.   This patient is a age/sex that meets the FDA criteria for Emergency Use Authorization of casirivimab\imdevimab.  Has a (+) direct SARS-CoV-2 viral test result 1. Has mild or moderate COVID-19  2. Is ? 85 years of age and weighs ? 40 kg 3. Is NOT hospitalized due to COVID-19 4. Is NOT requiring oxygen therapy or requiring an increase in baseline oxygen flow rate due to COVID-19 5. Is within 10 days of symptom onset 6. Has at least one of the high risk factor(s) for progression to severe COVID-19 and/or hospitalization as defined in EUA. Specific high risk criteria : Past Medical History:  Diagnosis Date  . Benign neoplasm of colon 10/24/2006  . BPH (benign prostatic hyperplasia)   . Daily headache   . Diverticulosis 09/03.2008  . Dysrhythmia    "irregular"  . Hypertension   . Internal hemorrhoids without mention of complication 44/92/0100  . Memory loss   . PAD (peripheral artery disease) ([place])   . Restless leg syndrome   . Sleep apnea    dx'd; wore mask; lost weight; turned in machine"  ?  ?    Symptom onset  10/31/2019   I have spoken and communicated the following to the patient or parent/caregiver:   1. FDA has authorized the emergency use of casirivimab\imdevimab for the treatment of mild to moderate COVID-19 in adults and pediatric patients with positive results of direct SARS-CoV-2 viral testing who are 64 years of age and older weighing at least 40 kg, and who are at high risk for progressing to severe COVID-19 and/or hospitalization.   2. The significant known and potential risks and benefits of casirivimab\imdevimab, and the extent to which such potential risks and benefits are unknown.   3. Information on available alternative treatments and the risks and benefits of those alternatives, including clinical  trials.   4. Patients treated with casirivimab\imdevimab should continue to self-isolate and use infection control measures (e.g., wear mask, isolate, social distance, avoid sharing personal items, clean and disinfect "high touch" surfaces, and frequent handwashing) according to CDC guidelines.    5. The patient or parent/caregiver has the option to accept or refuse casirivimab\imdevimab .   After reviewing this information with the patient, The patient agreed to proceed with receiving casirivimab\imdevimab infusion and will be provided a copy of the Fact sheet prior to receiving the infusion.Rulon Abide, AGNP-C [phone] ([place])

## 2019-11-06 ENCOUNTER — Ambulatory Visit (HOSPITAL_COMMUNITY)
Admission: RE | Admit: 2019-11-06 | Discharge: 2019-11-06 | Disposition: A | Discharge status: Home / Self Care | Payer: Medicare Other | Source: Ambulatory Visit | Attending: Pulmonary Disease | Admitting: Pulmonary Disease

## 2019-11-06 DIAGNOSIS — U071 COVID-19: Secondary | ICD-10-CM | POA: Diagnosis present

## 2019-11-06 DIAGNOSIS — Z23 Encounter for immunization: Secondary | ICD-10-CM | POA: Insufficient documentation

## 2019-11-06 MED ORDER — EPINEPHRINE 0.3 MG/0.3ML IJ SOAJ: 0.3000 mg | Freq: Once | INTRAMUSCULAR | Status: DC | PRN

## 2019-11-06 MED ORDER — ALBUTEROL SULFATE HFA 108 (90 BASE) MCG/ACT IN AERS: 2.0000 | INHALATION_SPRAY | Freq: Once | RESPIRATORY_TRACT | Status: DC | PRN

## 2019-11-06 MED ORDER — FAMOTIDINE IN NACL 20-0.9 MG/50ML-% IV SOLN: 20.0000 mg | Freq: Once | INTRAVENOUS | Status: DC | PRN

## 2019-11-06 MED ORDER — SODIUM CHLORIDE 0.9 % IV SOLN
1200.0000 mg | Freq: Once | INTRAVENOUS | Status: AC
  Administered 2019-11-06: 1200 mg via INTRAVENOUS

## 2019-11-06 MED ORDER — METHYLPREDNISOLONE SODIUM SUCC 125 MG IJ SOLR: 125.0000 mg | Freq: Once | INTRAMUSCULAR | Status: DC | PRN

## 2019-11-06 MED ORDER — DIPHENHYDRAMINE HCL 50 MG/ML IJ SOLN: 50.0000 mg | Freq: Once | INTRAMUSCULAR | Status: DC | PRN

## 2019-11-06 MED ORDER — SODIUM CHLORIDE 0.9 % IV SOLN: INTRAVENOUS | Status: DC | PRN

## 2019-11-06 NOTE — Discharge Instructions (Signed)
10 Things You Can Do to Manage Your COVID-19 Symptoms at Home If you have possible or confirmed COVID-19: 1. Stay home from work and school. And stay away from other public places. If you must go out, avoid using any kind of public transportation, ridesharing, or taxis. 2. Monitor your symptoms carefully. If your symptoms get worse, call your healthcare provider immediately. 3. Get rest and stay hydrated. 4. If you have a medical appointment, call the healthcare provider ahead of time and tell them that you have or may have COVID-19. 5. For medical emergencies, call 911 and notify the dispatch personnel that you have or may have COVID-19. 6. Cover your cough and sneezes with a tissue or use the inside of your elbow. 7. Wash your hands often with soap and water for at least 20 seconds or clean your hands with an alcohol-based hand sanitizer that contains at least 60% alcohol. 8. As much as possible, stay in a specific room and away from other people in your home. Also, you should use a separate bathroom, if available. If you need to be around other people in or outside of the home, wear a mask. 9. Avoid sharing personal items with other people in your household, like dishes, towels, and bedding. 10. Clean all surfaces that are touched often, like counters, tabletops, and doorknobs. Use household cleaning sprays or wipes according to the label instructions. [email or web address] 08/21/2018 This information is not intended to replace advice given to you by your health care provider. Make sure you discuss any questions you have with your health care provider. Document Revised: 01/23/2019 Document Reviewed: 01/23/2019 Elsevier Patient Education  2020 Elsev  What types of side effects do monoclonal antibody drugs cause?  Common side effects  In general, the more common side effects caused by monoclonal antibody drugs include: . Allergic reactions, such as hives or itching . Flu-like signs and  symptoms, including chills, fatigue, fever, and muscle aches and pains . Nausea, vomiting . Diarrhea . Skin rashes . Low blood pressure   The CDC is recommending patients who receive monoclonal antibody treatments wait at least 90 days before being vaccinated.  Currently, there are no data on the safety and efficacy of mRNA COVID-19 vaccines in persons who received monoclonal antibodies or convalescent plasma as part of COVID-19 treatment. Based on the estimated half-life of such therapies as well as evidence suggesting that reinfection is uncommon in the 90 days after initial infection, vaccination should be deferred for at least 90 days, as a precautionary measure until additional information becomes available, to avoid interference of the antibody treatment with vaccine-induced immune responses.

## 2019-11-06 NOTE — Progress Notes (Signed)
  Diagnosis: COVID-19  Physician: DR. Joya Gaskins  Procedure: [facility] Med: casirivimab\imdevimab infusion - Provided patient with casirivimab\imdevimab fact sheet for patients, parents and caregivers prior to infusion.  Complications: No immediate complications noted.  Discharge: Discharged home   Ludwig Lean 11/06/2019

## 2019-12-10 ENCOUNTER — Ambulatory Visit (HOSPITAL_COMMUNITY)
Admission: RE | Admit: 2019-12-10 | Discharge: 2019-12-10 | Disposition: A | Discharge status: Home / Self Care | Payer: Medicare HMO | Source: Ambulatory Visit | Attending: Cardiovascular Disease | Admitting: Cardiovascular Disease

## 2019-12-10 ENCOUNTER — Other Ambulatory Visit: Payer: Self-pay

## 2019-12-10 ENCOUNTER — Other Ambulatory Visit (HOSPITAL_COMMUNITY): Payer: Self-pay | Admitting: Internal Medicine

## 2019-12-10 ENCOUNTER — Ambulatory Visit (HOSPITAL_BASED_OUTPATIENT_CLINIC_OR_DEPARTMENT_OTHER)
Admission: RE | Admit: 2019-12-10 | Discharge: 2019-12-10 | Disposition: A | Discharge status: Home / Self Care | Payer: Medicare HMO | Source: Ambulatory Visit | Attending: Cardiology | Admitting: Cardiology

## 2019-12-10 DIAGNOSIS — Z95828 Presence of other vascular implants and grafts: Secondary | ICD-10-CM | POA: Insufficient documentation

## 2019-12-10 DIAGNOSIS — I739 Peripheral vascular disease, unspecified: Secondary | ICD-10-CM | POA: Insufficient documentation

## 2019-12-10 DIAGNOSIS — I700 Atherosclerosis of aorta: Secondary | ICD-10-CM

## 2019-12-10 DIAGNOSIS — I708 Atherosclerosis of other arteries: Secondary | ICD-10-CM

## 2019-12-12 ENCOUNTER — Encounter: Payer: Self-pay | Admitting: Internal Medicine

## 2019-12-12 ENCOUNTER — Other Ambulatory Visit: Payer: Self-pay

## 2019-12-12 ENCOUNTER — Ambulatory Visit: Payer: Medicare HMO | Admitting: Internal Medicine

## 2019-12-12 VITALS — BP 96/50 | HR 66 | Ht 70.0 in | Wt 175.0 lb

## 2019-12-12 DIAGNOSIS — R600 Localized edema: Secondary | ICD-10-CM | POA: Diagnosis not present

## 2019-12-12 DIAGNOSIS — E785 Hyperlipidemia, unspecified: Secondary | ICD-10-CM | POA: Diagnosis not present

## 2019-12-12 DIAGNOSIS — I739 Peripheral vascular disease, unspecified: Secondary | ICD-10-CM | POA: Diagnosis not present

## 2019-12-12 DIAGNOSIS — I10 Essential (primary) hypertension: Secondary | ICD-10-CM | POA: Diagnosis not present

## 2019-12-12 NOTE — Patient Instructions (Signed)
Medication Instructions:  Your physician recommends that you continue on your current medications as directed. Please refer to the Current Medication list given to you today.  *If you need a refill on your cardiac medications before your next appointment, please call your pharmacy*  Follow-Up: At [facility], you and your health needs are our priority.  As part of our continuing mission to provide you with exceptional heart care, we have created designated Provider Care Teams.  These Care Teams include your primary Cardiologist (physician) and Advanced Practice Providers (APPs -  Physician Assistants and Nurse Practitioners) who all work together to provide you with the care you need, when you need it.  We recommend signing up for the patient portal called "MyChart".  Sign up information is provided on this After Visit Summary.  MyChart is used to connect with patients for Virtual Visits (Telemedicine).  Patients are able to view lab/test results, encounter notes, upcoming appointments, etc.  Non-urgent messages can be sent to your provider as well.   To learn more about what you can do with MyChart, go to [email or web address].    Your next appointment:   6 month(s)  The format for your next appointment:   In Person  Provider:   You may see Pixie Casino, MD or one of the following Advanced Practice Providers on your designated Care Team:    Almyra Deforest, PA-C  Fabian Sharp, PA-C or   Roby Lofts, [place]    Other Instructions  Dr. Debara Pickett recommends KNEE HIGH COMPRESSION STOCKINGS  -- 20-30 mmHg (compression strength) -- [facility]  -- [address]  -- [place]. [place]   -- [place]  -- [address] [place]  -- [phone]   How to Use Compression Stockings Compression stockings are elastic socks that squeeze the legs. They help to  increase blood flow to the legs, decrease swelling in the legs, and reduce the chance of developing blood clots in the lower legs. Compression stockings are often used by people who:  Are recovering from surgery.  Have poor circulation in their legs.  Are prone to getting blood clots in their legs.  Have varicose veins.  Sit or stay in bed for long periods of time. How to use compression stockings Before you put on your compression stockings:  Make sure that they are the correct size. If you do not know your size, ask your health care provider.  Make sure that they are clean, dry, and in good condition.  Check them for rips and tears. Do not put them on if they are ripped or torn. Put your stockings on first thing in the morning, before you get out of bed. Keep them on for as long as your health care provider advises. When you are wearing your stockings:  Keep them as smooth as possible. Do not allow them to bunch up. It is especially important to prevent the stockings from bunching up around your toes or behind your knees.  Do not roll the stockings downward and leave them rolled down. This can decrease blood flow to your leg.  Change them right away if they become wet or dirty. When you take off your stockings, inspect your legs and feet. Anything that does not seem normal may require medical attention. Look for:  Open sores.  Red spots.  Swelling. Information and tips  Do not stop wearing your compression  stockings without talking to your health care provider first.  Wash your stockings every day with mild detergent in cold or warm water. Do not use bleach. Air-dry your stockings or dry them in a clothes dryer on low heat.  Replace your stockings every 3-6 months.  If skin moisturizing is part of your treatment plan, apply lotion or cream at night so that your skin will be dry when you put on the stockings in the morning. It is harder to put the stockings on when you have  lotion on your legs or feet. Contact a health care provider if: Remove your stockings and seek medical care if:  You have a feeling of pins and needles in your feet or legs.  You have any new changes in your skin.  You have skin lesions that are getting worse.  You have swelling or pain that is getting worse. Get help right away if:  You have numbness or tingling in your lower legs that does not get better right after you take the stockings off.  Your toes or feet become cold and blue.  You develop open sores or red spots on your legs that do not go away.  You see or feel a warm spot on your leg.  You have new swelling or soreness in your leg.  You are short of breath or you have chest pain for no reason.  You have a rapid or irregular heartbeat.  You feel light-headed or dizzy. This information is not intended to replace advice given to you by your health care provider. Make sure you discuss any questions you have with your health care provider. Document Released: 12/04/2008 Document Revised: 07/07/2015 Document Reviewed: 01/14/2014 Elsevier Interactive Patient Education  2017 [employer].

## 2019-12-12 NOTE — Progress Notes (Signed)
OFFICE NOTE  Chief Complaint:  Fatigue, leg weakness  Primary Care Physician: Bernerd Limbo, MD  HPI:  Carl Harper is a pleasant 85 year old male previously followed Dr. Rollene Fare with a history of peripheral arterial disease. He underwent diamondback orbital rotational atherectomy by Dr. Gwenlyn Found in 2013 with stenting of the calcified right common iliac stenosis. He has mild nonobstructive coronary disease by cath in 2001 and a negative Myoview in 2011. He also has obstructive sleep apnea on CPAP has had reflux symptoms and atypical chest pain from time to time.  His echocardiogram does show mild concentric LVH and borderline aortic root dilatation with a possible small ascending aortic aneurysm which will need to be followed. At his last visit with Dr. Rollene Fare his Toprol was cut down to 25 mg alternating with 12.5 mg every 2 some bradycardia.   Carl Harper was recently seen by Dr. Percival Spanish in the office for shortness of breath and palpitations as well as exertional dyspnea. He wore the monitor however that was never performed as no monitors were available. He did undergo an exercise tolerance test for which he exercised for 6 minutes and 7 metabolic equivalents. There was no evidence of ischemia. He did have PVCs and a fairly flat blood pressure response to exercise. There was marked dyspnea with exertion. He also is reported recent tremors, some memory loss and instability with his gait.  Carl Harper returns today and is complaining of a sinking spell his chest. When pressed further he felt like he was possibly presyncopal occasionally has trouble getting his breath. He has some occasional dizziness. He was evaluated for monitor however none was placed because there was not a monitor available. He did have his metoprolol increased to 12.5/25 mg every other day. He seems to think that this is helped his symptoms and feels better than he had when he saw Dr. Percival Spanish.  I saw Carl Harper back in  the office today. He says that he call and make an appointment about a month ago because his been feeling some episodes of chest discomfort in the morning. He's also felt somewhat short of breath. An EKG in the office today demonstrates new onset atrial flutter with ventricular bigeminy at a rate of 77. This is a new diagnosis for him. I spent greater than 10 minutes discussing atrial fibrillation and anticoagulation options with him. He understands his options and the need for trying to establish a sinus rhythm.  [name] returns today the office for follow-up. His EKG today fortunately shows sinus rhythm with PVCs. This is good news as we will not need to schedule him for a cardioversion. He has been taking Eliquis and feels that this is tolerable. He denies any bleeding problems. Here she does feel improvement in energy and I suspect this is from converting back to sinus rhythm at some point. Based on his TIA event, I suspect it may been related to paroxysmal atrial flutter. He did have carotid Dopplers which show very mild bilateral carotid disease and are not likely the source of his TIA event.  I saw Carl Harper back today in the office. He is maintaining sinus bradycardia with first degree AV block at a rate of 52. He denies any bleeding problems on Eliquis. He is on low-dose amiodarone. This was started by Dr. Curt Bears on 12/30/14, after monitor was placed which showed a very high burden of PVCs greater than 30% as well as A. fib. A repeat echo was performed which showed preserved  LV systolic function. Carl Harper was symptomatic with his PVCs. He reports a marked reduction in his symptoms after starting the amiodarone. I do not see these had baseline pulmonary function testing, thyroid or liver function tests.  11/09/2015  Carl Harper returns today for follow-up. He has had recent worsening shortness of breath and significant fatigue and leg weakness with exercise. This is of fairly recent  finding. He was seen by Dr. Curt Bears, who noted that recently Mr. Mcgreal ophthalmologist had recommended discontinuing amiodarone due to corneal deposits. Since that time, he has been having a washout of the amiodarone with the plan to reassess his burden of PVCs by monitoring at the end of September. This is a ready been scheduled. His last echo was in 2016 and showed normal LV function, but he has had new symptoms as described above and would likely benefit from a repeat echo to rule out cardiomyopathy.  02/04/2016  Carl Harper returns today for follow-up. He has been seen by Dr. Curt Bears for evaluation of his a-fib. He was on amiodarone, but we discontinued it due to corneal deposits. He has maintained sinus rhythm without recurrent atrial fibrillation. PVCs were noted and noted to be significant and he was started on mexiletine. Since that he's had some improvement in his symptoms although was noted to have some PVCs today. He has a follow-up with Dr. Curt Bears in a few months. We repeated an echocardiogram which showed normal systolic and diastolic function with mild left atrial enlargement.  08/10/2016  Carl Harper returns today. He reports his palpitations have improved significantly with the addition of mexiletine. He is seen Dr. Curt Bears with cardiac electrophysiology for this. Blood pressure is well-controlled today 102/52. EKG which is personally reviewed shows sinus bradycardia with marked sinus arrhythmia and first-degree AV block at 53. QTC is 377 ms with a left anterior fascicular block and first-degree AV block.   02/05/2017  Carl Harper was seen today in follow-up.  Overall is without complaints.  He denies any significant palpitations, shortness of breath, chest pain or lower extremity claudication.  He is been successfully treated on mexiletine and although he continues to have some PVCs, the frequency and burden have decreased significantly.  Blood pressure is at goal.  EKG was  personally reviewed today which is sinus rhythm, first-degree AV block and occasional PVCs, left anterior fascicular block and QTC 413 ms.  09/11/2017  Mr. Porche returns today for follow-up.  Again he is doing well without complaints.  He denies any palpitations or significant arrhythmias.  He denies any chest pain or worsening shortness of breath.  He occasionally gets some fatigue with working out in the hot weather for long periods of time.  This may be related to some low normal blood pressure.  He is using Lasix as needed.  Blood pressure today was 108/58 however he generally says that his blood pressures around 622 systolic.  11/28/2017  Mr. Barth is seen today in follow-up.  He seems to be doing well.  He denies any recurrent atrial fibrillation.  He is tolerating Eliquis without bleeding issues.  Blood pressures well controlled today 121/60.  He denies any PVCs and he is compliant with mexiletine and metoprolol.  EKG shows sinus bradycardia with first-degree AV block, nonspecific IVCD with QTC of 411 ms.  His only other concern is he has some right leg pain.  He does have a history of PAD.  He says sometimes this is worse with exertion and improves at rest but at  other times he gets a pain in his right hip when laying in bed, which is more concerning for a bursitis or possibly osteoarthritis.  09/02/2018  Mr. Hingle is seen today for follow-up.  He he had to virtual visit since I last saw him in the office.  He had some chest pain and underwent a CT of the chest because of a dilated aorta.  This showed aortic ectasia measuring between 4.8 and 5 cm.  The previous 5 cm measurement was in 2001 however his most recent CT measurement was less significant.  There was no evidence of dissection.  I had increased his Imdur however he reduced it to 30 mg daily.  He notes that he is only taking sublingual nitro about once a week.  In general he is fairly comfortable.  His EKG incidentally did show  atrial fibrillation today which is rate controlled.  He is on mexiletine and Eliquis.  01/06/2019  Mr. Wandel returns for follow-up.  He is complaining of some fatigue and weakness.  He just had a repeat CT which showed stable aortic ectasia measuring 4.8 cm.  He had a 5 cm aorta noted in 2001 as well.  I suspect this will continue to be stable.  He does report some fatigue and leg weakness.  Blood pressure was noted to be low today.  He is also an bradycardic response with his A. Fib.  12/12/2019  Mr. Cothran is seen today for follow-up.  I did lower extremity arterial Dopplers to follow-up on leg weakness however this showed no evidence of arterial insufficiency with a patent.  Abdominal aortic imaging showed no evidence of aneurysm.  He does have a thoracic aneurysm which is stable by CT measuring between 4.8 and 4.9 cm.  We will continue to follow that with annual CTs.  He is to follow-up with his PCP regarding fatigue.  Blood pressure was low today at 96/50 however his wife reported that was unusual.  He is on very little medication to lower blood pressure.  He seems to be tolerating mexiletine and is in the sinus rhythm today with PACs.  PMHx:  Past Medical History:  Diagnosis Date  . Benign neoplasm of colon 10/24/2006  . BPH (benign prostatic hyperplasia)   . Daily headache   . Diverticulosis 09/03.2008  . Dysrhythmia    "irregular"  . Hypertension   . Internal hemorrhoids without mention of complication 74/09/1446  . Memory loss   . PAD (peripheral artery disease) ([place])   . Restless leg syndrome   . Sleep apnea    dx'd; wore mask; lost weight; turned in machine"    Past Surgical History:  Procedure Laterality Date  . APPENDECTOMY  ~ 1954  . ATHERECTOMY Right 11/07/2011   Procedure: ATHERECTOMY;  Surgeon: Lorretta Harp, MD;  Location: [facility] CATH LAB;  Service: Cardiovascular;  Laterality: Right;  . CARDIAC CATHETERIZATION  03/30/1999   non-critical CAD  . CATARACT  EXTRACTION W/ INTRAOCULAR LENS  IMPLANT, BILATERAL  2011-2012  . ILIAC ARTERY STENT Right 11/07/2011   Diamondback orbital rotational atherectomy, PTA & stenting of calcified R CIA (Dr. Adora Fridge)  . LOWER EXTREMITY ANGIOGRAM N/A 11/07/2011   Procedure: LOWER EXTREMITY ANGIOGRAM;  Surgeon: Lorretta Harp, MD;  Location: [facility] CATH LAB;  Service: Cardiovascular;  Laterality: N/A;  . NM MYOCAR PERF WALL MOTION  09/2012   lexiscan myoview - low risk with fixed inferior defect with underlying bowel attenuation suggestive of artifact  . PERCUTANEOUS STENT INTERVENTION  11/07/2011   Procedure: PERCUTANEOUS STENT INTERVENTION;  Surgeon: Lorretta Harp, MD;  Location: [facility] CATH LAB;  Service: Cardiovascular;;  . SHOULDER ARTHROSCOPY W/ ROTATOR CUFF REPAIR Left 2010  . Sleep Study  01/09/2011   AHI during total sleep 14.4/hr and during REM 19.4/hr  . TRANSTHORACIC ECHOCARDIOGRAM  09/2012   EF 55-60%, LA mildly dilated    FAMHx:  Family History  Problem Relation Age of Onset  . Heart Problems Father 7  . Cancer Brother 7  . Heart failure Brother   . Cancer Maternal Grandfather        prostate    SOCHx:   reports that he quit smoking about 25 years ago. His smoking use included cigarettes. He has a 55.00 pack-year smoking history. He has never used smokeless tobacco. He reports current alcohol use of about 14.0 standard drinks of alcohol per week. He reports that he does not use drugs.  ALLERGIES:  Allergies  Allergen Reactions  . Amiodarone Other (See Comments)    Other reaction(s): Other (See Comments) Corneal deposits Corneal deposits  . Bee Venom Other (See Comments)    unknown    ROS: Pertinent items noted in HPI and remainder of comprehensive ROS otherwise negative.  HOME MEDS: Current Outpatient Medications  Medication Sig Dispense Refill  . aspirin 81 MG tablet Take 81 mg by mouth daily.    Marland Kitchen ELIQUIS 5 MG TABS tablet TAKE ONE TABLET BY MOUTH TWICE A DAY 180 tablet 1  .  finasteride (PROSCAR) 5 MG tablet Take 5 mg by mouth daily.    . furosemide (LASIX) 20 MG tablet TAKE ONE TABLET BY MOUTH DAILY 90 tablet 3  . isosorbide mononitrate (IMDUR) 30 MG 24 hr tablet Take 2 tablets (60 mg total) by mouth daily. 60 tablet 11  . losartan (COZAAR) 25 MG tablet TAKE ONE TABLET BY MOUTH DAILY 90 tablet 2  . LUTEIN PO Take 1 tablet by mouth daily.     . Magnesium 200 MG TABS Take 1 tablet (200 mg total) by mouth daily. 30 each 4  . metoprolol succinate (TOPROL-XL) 25 MG 24 hr tablet Take 0.5 tablets (12.5 mg total) by mouth daily. 45 tablet 2  . mexiletine (MEXITIL) 250 MG capsule TAKE ONE CAPSULE BY MOUTH TWICE A DAY 60 capsule 5  . Multiple Vitamins-Minerals (PRESERVISION AREDS) CAPS Take 2 capsules by mouth daily.     Marland Kitchen MYRBETRIQ 50 MG TB24 tablet Take 1 tablet by mouth daily.    . nitroGLYCERIN (NITROSTAT) 0.4 MG SL tablet DISSOLVE 1 TAB UNDER TONGUE FOR CHEST PAIN - IF PAIN REMAINS AFTER 5 MIN, CALL 911 AND REPEAT DOSE. MAX 3 TABS IN 15 MINUTES 25 tablet 4  . pramipexole (MIRAPEX) 0.75 MG tablet Take 1 tablet by mouth daily.    . RESTASIS 0.05 % ophthalmic emulsion Apply 1 drop to eye 2 (two) times daily.    . Tamsulosin HCl (FLOMAX) 0.4 MG CAPS Take 0.4 mg by mouth daily after supper.    . traZODone (DESYREL) 100 MG tablet Take 100 mg by mouth at bedtime.  3  . vitamin B-12 (CYANOCOBALAMIN) 100 MCG tablet Take 100 mcg by mouth as needed.      No current facility-administered medications for this visit.    LABS/IMAGING: No results found for this or any previous visit (from the past 48 hour(s)). No results found.  VITALS: BP (!) 96/50 (BP Location: Left Arm, Patient Position: Sitting, Cuff Size: Normal)   Pulse 66   Ht  5\' 10"  (1.778 m)   Wt 175 lb (79.4 kg)   BMI 25.11 kg/m   EXAM: General appearance: alert and no distress Neck: no carotid bruit, no JVD and thyroid not enlarged, symmetric, no tenderness/mass/nodules Lungs: clear to auscultation  bilaterally Heart: irregularly irregular rhythm Abdomen: soft, non-tender; bowel sounds normal; no masses,  no organomegaly Extremities: edema Trace ankle edema bilaterally Pulses: 2+ and symmetric Skin: Skin color, texture, turgor normal. No rashes or lesions Neurologic: Grossly normal Psych: Pleasant  EKG: A. fib at 66, LAFB -personally reviewed  ASSESSMENT: 1. Paroxysmal afib/flutter with frequent PVC's - CHADSVASC score of 4 on Eliquis- amiodarone discontinued due to corneal deposits 2. PVC's -suppressed with mexilitine 3. PAD status post diamondback were orbital atherectomy and stenting of the right common iliac artery 4. Hypertension 5. Dyslipidemia 6. Mild CAD 7. Obstructive sleep apnea- not on CPAP 8. DOE - negative treadmill stress test 9. Tremor/memory loss/gait abnormalities - no evidence of Parkinson's by recent neurology consult 10. Ascending aortic aneurysm measuring 4.8 cm (5.0 cm measurement in 2001)  PLAN: 1.   Mr. Aloisi seems to be stable although continues to have weakness and leg discomfort.  He has some lower extremity swelling.  His lower extremity arterial Dopplers showed normal blood flow.  His stent is stable.  He has venous insufficiency and I think would benefit from compression stockings which I will provide a prescription for today.  He denies any recurrent PVCs and is suppressed with mexiletine.  His EKG does likely show A. fib today.  He is on Eliquis.  Blood pressure is low today.  I have advised him to monitor it further as we may need to consider discontinuing his losartan.  Follow-up with me in 6 months or sooner as necessary.  Pixie Casino, MD, [facility], [place] Director of the Advanced Lipid Disorders &  Cardiovascular Risk Reduction Clinic Attending Cardiologist  Direct Dial: [phone]  Fax: [phone]  Website:  [email or web address] Taya Ashbaugh 12/12/2019, 2:29 PM

## 2019-12-21 ENCOUNTER — Other Ambulatory Visit: Payer: Self-pay | Admitting: Internal Medicine

## 2019-12-21 ENCOUNTER — Other Ambulatory Visit: Payer: Self-pay | Admitting: Urology

## 2019-12-21 ENCOUNTER — Other Ambulatory Visit: Payer: Self-pay | Admitting: Cardiology

## 2019-12-21 DIAGNOSIS — I480 Paroxysmal atrial fibrillation: Secondary | ICD-10-CM

## 2019-12-22 NOTE — Telephone Encounter (Signed)
60m 79.4kg Scr 1.57 10/28/19 Lovw/hilty 12/12/19 Pt requesting 5mg  eliquis but is only eligible for the 2.5mg  will route to the pharmd pool

## 2019-12-23 ENCOUNTER — Other Ambulatory Visit: Payer: Self-pay | Admitting: Urology

## 2019-12-23 NOTE — Telephone Encounter (Signed)
Patient qualifies for dose reduction.  Called patient, patient aware new dose is 2.5mg  BID

## 2019-12-24 ENCOUNTER — Other Ambulatory Visit: Payer: Self-pay | Admitting: Cardiology

## 2019-12-26 ENCOUNTER — Telehealth: Payer: Self-pay | Admitting: Internal Medicine

## 2019-12-26 NOTE — Telephone Encounter (Signed)
Spoke with patient regarding appointment for CTA chest/aorta scheduled Tuesday 01/13/20 at 11:40 am---arrival time is 11:20 am at [address] [facility])---liquids only 4 hours prior to exam---patient to come in 01/06/20 for lab work

## 2020-01-06 ENCOUNTER — Other Ambulatory Visit: Payer: Self-pay

## 2020-01-06 DIAGNOSIS — R079 Chest pain, unspecified: Secondary | ICD-10-CM

## 2020-01-06 DIAGNOSIS — Z01810 Encounter for preprocedural cardiovascular examination: Secondary | ICD-10-CM

## 2020-01-07 LAB — BASIC METABOLIC PANEL
BUN/Creatinine Ratio: 21 (ref 10–24)
BUN: 31 mg/dL — ABNORMAL HIGH (ref 8–27)
CO2: 24 mmol/L (ref 20–29)
Calcium: 9.1 mg/dL (ref 8.6–10.2)
Chloride: 109 mmol/L — ABNORMAL HIGH (ref 96–106)
Creatinine, Ser: 1.48 mg/dL — ABNORMAL HIGH (ref 0.76–1.27)
GFR calc Af Amer: 50 mL/min/{1.73_m2} — ABNORMAL LOW (ref >59)
GFR calc non Af Amer: 43 mL/min/{1.73_m2} — ABNORMAL LOW (ref >59)
Glucose: 105 mg/dL — ABNORMAL HIGH (ref 65–99)
Potassium: 4.9 mmol/L (ref 3.5–5.2)
Sodium: 147 mmol/L — ABNORMAL HIGH (ref 134–144)

## 2020-01-13 ENCOUNTER — Ambulatory Visit
Admission: RE | Admit: 2020-01-13 | Discharge: 2020-01-13 | Disposition: A | Discharge status: Home / Self Care | Payer: Medicare HMO | Source: Ambulatory Visit | Attending: Physician Assistant | Admitting: Physician Assistant

## 2020-01-13 DIAGNOSIS — I712 Thoracic aortic aneurysm, without rupture, unspecified: Secondary | ICD-10-CM

## 2020-01-13 MED ORDER — IOPAMIDOL (ISOVUE-370) INJECTION 76%
75.0000 mL | Freq: Once | INTRAVENOUS | Status: AC | PRN
  Administered 2020-01-13: 60 mL via INTRAVENOUS

## 2020-01-14 ENCOUNTER — Other Ambulatory Visit: Payer: Self-pay

## 2020-01-14 DIAGNOSIS — E870 Hyperosmolality and hypernatremia: Secondary | ICD-10-CM

## 2020-01-20 ENCOUNTER — Other Ambulatory Visit: Payer: Self-pay

## 2020-01-20 DIAGNOSIS — I712 Thoracic aortic aneurysm, without rupture, unspecified: Secondary | ICD-10-CM

## 2020-01-24 ENCOUNTER — Other Ambulatory Visit: Payer: Self-pay | Admitting: Urology

## 2020-01-24 ENCOUNTER — Other Ambulatory Visit: Payer: Self-pay | Admitting: Internal Medicine

## 2020-01-26 NOTE — Telephone Encounter (Signed)
Prescription refill request for Eliquis received. Indication:  Atrial Fibrillation Last office visit: 11/2019  Hilty Scr: 1.48  12/2019 Age:85  Weight: 79.4 kg  Prescription refilled

## 2020-02-02 ENCOUNTER — Telehealth: Payer: Self-pay | Admitting: Internal Medicine

## 2020-02-02 NOTE — Telephone Encounter (Signed)
Reviewed chart and looks like Dr Curt Bears prescreibed This is an antiarrythmic and Dr Curt Bears is his EP physician  Left message to call back

## 2020-02-02 NOTE — Telephone Encounter (Signed)
Pt c/o medication issue:  1. Name of Medication: mexiletine (MEXITIL) 250 MG capsule    2. How are you currently taking this medication (dosage and times per day)? Twice daily  3. Are you having a reaction (difficulty breathing--STAT)? No  4. What is your medication issue? Patient wants this prescribed by Dr Debara Pickett oppose from Dr Curt Bears. The pharmacy is: [facility] at [place], [place]

## 2020-02-04 MED ORDER — MEXILETINE HCL 250 MG PO CAPS
250.0000 mg | ORAL_CAPSULE | Freq: Two times a day (BID) | ORAL | 0 refills | Status: DC
Start: 2020-02-04 — End: 2020-02-05

## 2020-02-04 NOTE — Telephone Encounter (Signed)
Patient called to check the status of the refill

## 2020-02-04 NOTE — Telephone Encounter (Signed)
    Pt's wife calling back, she said the reason why they wanted to have Dr. Debara Pickett refill his meds because pt no longer sees Dr. Curt Bears and Dr. Curt Bears turned over all pt's meds to Dr. Debara Pickett

## 2020-02-04 NOTE — Telephone Encounter (Signed)
Carl Malady, RN, did Dr. Curt Bears released pt and pt now sees Dr. Debara Pickett? Pt's wife states that Dr. Curt Bears has released pt and now Dr. Debara Pickett has taken over pt's medications. Please address

## 2020-02-04 NOTE — Telephone Encounter (Signed)
Routed to [employer] - med prescribed by Dr. Curt Bears - per last refill, not was made that patient is overdue for appt with this MD

## 2020-02-04 NOTE — Telephone Encounter (Signed)
Pt called back and stated that he was out of medication. I sent a 15 day supply to pt's pharmacy as requested. Sherri, RN, can you please clarify for pt that he does not need to see Dr. Curt Bears anymore. Pt would like a call concerning this matter. Please address

## 2020-02-05 MED ORDER — MEXILETINE HCL 250 MG PO CAPS: 250.0000 mg | ORAL_CAPSULE | Freq: Two times a day (BID) | ORAL | 1 refills | Status: DC

## 2020-02-05 NOTE — Telephone Encounter (Signed)
Pt made aware that he is overdue for follow up with Dr. Curt Bears.  Explained/informed that typically MD will see pt q18m while taking an antiarrythmic, such as Mexiletine. Pt is agreeable to plan.  Overdue follow up scheduled for 03/16/20. Pt aware I will send in for refills to get him through till the appointment next month. Patient verbalized understanding and agreeable to plan.  Camnitz aware refills sent in.

## 2020-02-05 NOTE — Telephone Encounter (Signed)
Patient called back. Aware that a 15 day supply has been sent to his pharmacy until we hear back from Dr. Curt Bears.

## 2020-02-19 ENCOUNTER — Telehealth: Payer: Self-pay | Admitting: Cardiology

## 2020-02-19 NOTE — Telephone Encounter (Signed)
*  STAT* If patient is at the pharmacy, call can be transferred to refill team.   1. Which medications need to be refilled? (please list name of each medication and dose if known)  mexiletine (MEXITIL) 250 MG capsule  2. Which pharmacy/location (including street and city if local pharmacy) is medication to be sent to? [facility]in Golden at [facility] [address], [place] - [address]  3. Do they need a 30 day or 90 day supply? 90

## 2020-02-24 MED ORDER — MEXILETINE HCL 250 MG PO CAPS: 250.0000 mg | ORAL_CAPSULE | Freq: Two times a day (BID) | ORAL | 0 refills | Status: DC

## 2020-02-24 NOTE — Telephone Encounter (Signed)
Rx(s) sent to pharmacy electronically. Appointment Mar 16, 2020 with Dr. Elberta Fortis

## 2020-03-02 ENCOUNTER — Telehealth: Payer: Self-pay | Admitting: Cardiology

## 2020-03-02 ENCOUNTER — Other Ambulatory Visit: Payer: Self-pay | Admitting: Internal Medicine

## 2020-03-02 NOTE — Telephone Encounter (Signed)
PA for Mexiletine needed, per notice from covermymeds.com Key: [id number] - Rx #: [id number]  Member ID: [id number]  Form [employer] Electronic PA Form (2017 NCPDP)  Original Claim Info [id number] NON-FORMULARY DRUG, CONTACT [facility]. PLEASE CONTACT MD(PHARMACY HELP DESK [phone])  Med refilled by Dr. Debara Pickett, but prescribed by Dr. Curt Bears  Routed to [facility] to process PA request

## 2020-03-03 NOTE — Telephone Encounter (Signed)
MEXILETINE HCL Capsule Type of coverage approved: Non-Formulary This approval authorizes your coverage from 02/21/2020 - 02/19/2021

## 2020-03-03 NOTE — Telephone Encounter (Signed)
**Note De-Identified Laketa Sandoz Obfuscation** I started a Mexiletine PA through covermymeds. Key: [id number]

## 2020-03-16 ENCOUNTER — Other Ambulatory Visit: Payer: Self-pay

## 2020-03-16 ENCOUNTER — Ambulatory Visit: Payer: Medicare HMO | Admitting: Cardiology

## 2020-03-16 ENCOUNTER — Encounter: Payer: Self-pay | Admitting: Cardiology

## 2020-03-16 VITALS — BP 126/66 | HR 60 | Ht 70.0 in | Wt 182.6 lb

## 2020-03-16 DIAGNOSIS — I480 Paroxysmal atrial fibrillation: Secondary | ICD-10-CM

## 2020-03-16 NOTE — Progress Notes (Signed)
Electrophysiology Office Note   Date:  03/16/2020   ID:  Carl Harper, DOB 15-Jul-1934, MRN [id number]  PCP:  Bernerd Limbo, MD  Cardiologist:  Lyman Bishop Primary Electrophysiologist:  Mariya Mottley Meredith Leeds, MD    No chief complaint on file.    History of Present Illness: Carl Harper is a 86 y.o. male who presents today for electrophysiology evaluation.  He has a history significant for peripheral arterial disease.  He underwent rotational atherectomy by Dr. Alvester Chou in 2013 with stenting of the calcified right common iliac stenosis.  He has mild nonobstructive coronary artery disease by cath in 2001 and a negative Myoview in 2011.  He has obstructive sleep apnea on CPAP.  He underwent a stress test which showed PVCs.  He was initially put on amiodarone but had corneal deposits and has been switched to mexiletine.  He also has atrial fibrillation and is on Eliquis.  Today, denies symptoms of palpitations, chest pain, shortness of breath, orthopnea, PND, lower extremity edema, claudication, dizziness, presyncope, syncope, bleeding, or neurologic sequela. The patient is tolerating medications without difficulties.  Unfortunately, he has developed persistent atrial fibrillation.  He has been having weakness and fatigue over the past few months.  I do feel that he has been in atrial fibrillation for about that amount of time.  Aside from that, he has been doing well and is without complaint.   Past Medical History:  Diagnosis Date  . Benign neoplasm of colon 10/24/2006  . BPH (benign prostatic hyperplasia)   . Daily headache   . Diverticulosis 09/03.2008  . Dysrhythmia    "irregular"  . Hypertension   . Internal hemorrhoids without mention of complication [id number]  . Memory loss   . PAD (peripheral artery disease) ([place])   . Restless leg syndrome   . Sleep apnea    dx'd; wore mask; lost weight; turned in machine"   Past Surgical History:  Procedure Laterality Date  .  APPENDECTOMY  ~ 1954  . ATHERECTOMY Right 11/07/2011   Procedure: ATHERECTOMY;  Surgeon: Lorretta Harp, MD;  Location: [facility] CATH LAB;  Service: Cardiovascular;  Laterality: Right;  . CARDIAC CATHETERIZATION  03/30/1999   non-critical CAD  . CATARACT EXTRACTION W/ INTRAOCULAR LENS  IMPLANT, BILATERAL  2011-2012  . ILIAC ARTERY STENT Right 11/07/2011   Diamondback orbital rotational atherectomy, PTA & stenting of calcified R CIA (Dr. Adora Fridge)  . LOWER EXTREMITY ANGIOGRAM N/A 11/07/2011   Procedure: LOWER EXTREMITY ANGIOGRAM;  Surgeon: Lorretta Harp, MD;  Location[facility] C[facility] LAB;  Service: Cardiovascular;  Laterality: N/A;  . NM MYOCAR PERF WALL MOTION  09/2012   lexiscan myoview - low risk with fixed inferior defect with underlying bowel attenuation suggestive of artifact  . PERCUTANEOUS STENT INTERVENTION  11/07/2011   Procedure: PERCUTANEOUS STENT INTERVENTION;  Surgeon: Lorretta Harp, MD;  Location: [facility] CATH LAB;  Service: Cardiovascular;;  . SHOULDER ARTHROSCOPY W/ ROTATOR CUFF REPAIR Left 2010  . Sleep Study  01/09/2011   AHI during total sleep 14.4/hr and during REM 19.4/hr  . TRANSTHORACIC ECHOCARDIOGRAM  09/2012   EF 55-60%, LA mildly dilated     Current Outpatient Medications  Medication Sig Dispense Refill  . apixaban (ELIQUIS) 2.5 MG TABS tablet Take 1 tablet (2.5 mg total) by mouth 2 (two) times daily. 180 tablet 0  . aspirin 81 MG tablet Take 81 mg by mouth daily.    Marland Kitchen ELIQUIS 5 MG TABS tablet TAKE ONE TABLET BY MOUTH TWICE A DAY  60 tablet 5  . finasteride (PROSCAR) 5 MG tablet Take 5 mg by mouth daily.    . furosemide (LASIX) 20 MG tablet TAKE ONE TABLET BY MOUTH DAILY 90 tablet 3  . isosorbide mononitrate (IMDUR) 30 MG 24 hr tablet TAKE ONE TABLET BY MOUTH DAILY 30 tablet 5  . losartan (COZAAR) 25 MG tablet TAKE ONE TABLET BY MOUTH DAILY 90 tablet 2  . LUTEIN PO Take 1 tablet by mouth daily.     . Magnesium 200 MG TABS Take 1 tablet (200 mg total) by mouth daily. 30 each 4   . metoprolol succinate (TOPROL-XL) 25 MG 24 hr tablet TAKE 1/2 TABLET BY MOUTH DAILY 45 tablet 2  . mexiletine (MEXITIL) 250 MG capsule Take 1 capsule (250 mg total) by mouth 2 (two) times daily. Must keep scheduled appointment with Dr. Curt Bears for refills. Thanks 180 capsule 0  . Multiple Vitamins-Minerals (PRESERVISION AREDS) CAPS Take 2 capsules by mouth daily.     Marland Kitchen MYRBETRIQ 50 MG TB24 tablet Take 1 tablet by mouth daily.    . nitroGLYCERIN (NITROSTAT) 0.4 MG SL tablet DISSOLVE 1 TAB UNDER TONGUE FOR CHEST PAIN - IF PAIN REMAINS AFTER 5 MIN, CALL 911 AND REPEAT DOSE. MAX 3 TABS IN 15 MINUTES 25 tablet 4  . pramipexole (MIRAPEX) 0.75 MG tablet Take 1 tablet by mouth daily.    . RESTASIS 0.05 % ophthalmic emulsion Apply 1 drop to eye 2 (two) times daily.    . solifenacin (VESICARE) 5 MG tablet Take 5 mg by mouth daily.    . Tamsulosin HCl (FLOMAX) 0.4 MG CAPS Take 0.4 mg by mouth daily after supper.    . traZODone (DESYREL) 100 MG tablet Take 100 mg by mouth at bedtime.  3   No current facility-administered medications for this visit.    Allergies:   Amiodarone and Bee venom   Social History:  The patient  reports that he quit smoking about 25 years ago. His smoking use included cigarettes. He has a 55.00 pack-year smoking history. He has never used smokeless tobacco. He reports current alcohol use of about 14.0 standard drinks of alcohol per week. He reports that he does not use drugs.   Family History:  The patient's family history includes Cancer in his maternal grandfather; Cancer (age of onset: 9) in his brother; Heart Problems (age of onset: 21) in his father; Heart failure in his brother.    ROS:  Please see the history of present illness.   Otherwise, review of systems is positive for none.   All other systems are reviewed and negative.   PHYSICAL EXAM: VS:  BP 126/66   Pulse 60   Ht 5\' 10"  (1.778 m)   Wt 182 lb 9.6 oz (82.8 kg)   SpO2 98%   BMI 26.20 kg/m  , BMI Body mass  index is 26.2 kg/m. GEN: Well nourished, well developed, in no acute distress  HEENT: normal  Neck: no JVD, carotid bruits, or masses Cardiac: RRR; no murmurs, rubs, or gallops,no edema  Respiratory:  clear to auscultation bilaterally, normal work of breathing GI: soft, nontender, nondistended, + BS MS: no deformity or atrophy  Skin: warm and dry Neuro:  Strength and sensation are intact Psych: euthymic mood, full affect  EKG:  EKG is ordered today. Personal review of the ekg ordered shows atrial fibrillation   Recent Labs: 10/28/2019: Hemoglobin 14.0; Platelets 208 01/06/2020: BUN 31; Creatinine, Ser 1.48; Potassium 4.9; Sodium 147    Lipid Panel  Component Value Date/Time   CHOL 189 04/11/2013 1058   TRIG 59 04/11/2013 1058   HDL 77 04/11/2013 1058   LDLCALC 100 (H) 04/11/2013 1058     Wt Readings from Last 3 Encounters:  03/16/20 182 lb 9.6 oz (82.8 kg)  12/12/19 175 lb (79.4 kg)  10/28/19 165 lb (74.8 kg)      Other studies Reviewed: Additional studies/ records that were reviewed today include: holter personally reviewed Review of the above records today demonstrates:  PAF, SVT, short runs of VT and ventricular bigeminy. 2.2 second pause, lowest HR of 35.  32% PVCs  3/16 MPI:  No perfusion defects to suggest ischemia. EF 71%.  TTE 11/24/15 - Left ventricle: The cavity size was normal. Wall thickness was   normal. Systolic function was normal. The estimated ejection   fraction was in the range of 55% to 60%. Wall motion was normal;   there were no regional wall motion abnormalities. Left   ventricular diastolic function parameters were normal. - Left atrium: The atrium was mildly dilated.  Holter Minimum HR: 34 BPM at 8:30:44 AM Maximum HR: 109 BPM at 8:49:36 PM Average HR: 66 BPM Sinus rhythm 17.4% PVCs 1.7% APCs Occasional atrial and ventricular bigeminy  ASSESSMENT AND PLAN:  1.  Persistent atrial fibrillation/flutter: Currently on Eliquis.  Is  been taken off of amiodarone due to corneal deposits.  CHA2DS2-VASc of 5.  Unfortunately he is in atrial fibrillation today with symptoms of weakness and fatigue.  I feel that he would need a cardioversion, but he is not yet ready to have that procedure done.  We Ryheem Jay have him follow-up in A. fib clinic in 1 month for further discussions.  If he does require medications, I do feel that either dofetilide or Multaq would be reasonable with mexiletine.  2.  PVCs: High burden based on monitor.  Was put on mexiletine with significant decrease in PVC burden.  We Bayler Gehrig continue current management.  High risk medication monitoring performed.  3.  Coronary artery pain: No current chest pain  Current medicines are reviewed at length with the patient today.   The patient does not have concerns regarding his medicines.  The following changes were made today: None  Labs/ tests ordered today include:  No orders of the defined types were placed in this encounter.    Disposition:   FU with Jadasia Haws 3 months  Signed, Swara Donze Meredith Leeds, MD  03/16/2020 3:56 PM     [place] [address] [place] [address] [phone] (office) [phone] (fax)

## 2020-03-16 NOTE — Patient Instructions (Signed)
Medication Instructions:  Your physician recommends that you continue on your current medications as directed. Please refer to the Current Medication list given to you today.  *If you need a refill on your cardiac medications before your next appointment, please call your pharmacy*   Lab Work: None ordered   Testing/Procedures: None ordered   Follow-Up: At [facility], you and your health needs are our priority.  As part of our continuing mission to provide you with exceptional heart care, we have created designated Provider Care Teams.  These Care Teams include your primary Cardiologist (physician) and Advanced Practice Providers (APPs -  Physician Assistants and Nurse Practitioners) who all work together to provide you with the care you need, when you need it.   Your next appointment:   1 month(s) to discuss cardioversion  The format for your next appointment:   In Person  Provider:   You will follow up in the [facility] located at [facility]. Your provider will be: Roderic Palau, NP or Clint R. Fenton, PA-C    Thank you for choosing [facility]!!   Trinidad Curet, RN [phone]   Other Instructions

## 2020-03-19 ENCOUNTER — Telehealth: Payer: Self-pay

## 2020-03-19 DIAGNOSIS — E870 Hyperosmolality and hypernatremia: Secondary | ICD-10-CM

## 2020-03-19 NOTE — Telephone Encounter (Signed)
Lab orders mailed to the pt.  

## 2020-03-19 NOTE — Telephone Encounter (Signed)
Called patient Carl Harper and spoke with wife Keon Pender who is on patient's DPR I informed her that I was calling to remind her husband about repeat BMET and orders will be mailed to their home address. Patient may come into the office or local [facility] to have labs drawn. Mrs. Keaney verbalized understanding and all (if any) questions were answered.

## 2020-03-30 ENCOUNTER — Other Ambulatory Visit: Payer: Self-pay

## 2020-03-30 MED ORDER — MEXILETINE HCL 250 MG PO CAPS: 250.0000 mg | ORAL_CAPSULE | Freq: Two times a day (BID) | ORAL | 2 refills | Status: DC

## 2020-03-31 LAB — BASIC METABOLIC PANEL
BUN/Creatinine Ratio: 18 (ref 10–24)
BUN: 29 mg/dL — ABNORMAL HIGH (ref 8–27)
CO2: 23 mmol/L (ref 20–29)
Calcium: 8.8 mg/dL (ref 8.6–10.2)
Chloride: 104 mmol/L (ref 96–106)
Creatinine, Ser: 1.59 mg/dL — ABNORMAL HIGH (ref 0.76–1.27)
GFR calc Af Amer: 45 mL/min/{1.73_m2} — ABNORMAL LOW (ref >59)
GFR calc non Af Amer: 39 mL/min/{1.73_m2} — ABNORMAL LOW (ref >59)
Glucose: 107 mg/dL — ABNORMAL HIGH (ref 65–99)
Potassium: 4.5 mmol/L (ref 3.5–5.2)
Sodium: 140 mmol/L (ref 134–144)

## 2020-04-14 ENCOUNTER — Other Ambulatory Visit: Payer: Self-pay

## 2020-04-14 ENCOUNTER — Ambulatory Visit (HOSPITAL_COMMUNITY)
Admission: RE | Admit: 2020-04-14 | Discharge: 2020-04-14 | Disposition: A | Discharge status: Home / Self Care | Payer: Medicare HMO | Source: Ambulatory Visit | Attending: Nurse Practitioner | Admitting: Nurse Practitioner

## 2020-04-14 ENCOUNTER — Encounter (HOSPITAL_COMMUNITY): Payer: Self-pay | Admitting: Nurse Practitioner

## 2020-04-14 VITALS — BP 102/60 | HR 65 | Ht 70.0 in | Wt 179.8 lb

## 2020-04-14 DIAGNOSIS — D6869 Other thrombophilia: Secondary | ICD-10-CM

## 2020-04-14 DIAGNOSIS — Z87891 Personal history of nicotine dependence: Secondary | ICD-10-CM | POA: Diagnosis not present

## 2020-04-14 DIAGNOSIS — Z79899 Other long term (current) drug therapy: Secondary | ICD-10-CM | POA: Insufficient documentation

## 2020-04-14 DIAGNOSIS — Z7982 Long term (current) use of aspirin: Secondary | ICD-10-CM | POA: Diagnosis not present

## 2020-04-14 DIAGNOSIS — Z7901 Long term (current) use of anticoagulants: Secondary | ICD-10-CM | POA: Diagnosis not present

## 2020-04-14 DIAGNOSIS — I4819 Other persistent atrial fibrillation: Secondary | ICD-10-CM | POA: Diagnosis not present

## 2020-04-14 DIAGNOSIS — I4892 Unspecified atrial flutter: Secondary | ICD-10-CM | POA: Diagnosis not present

## 2020-04-14 LAB — CBC
HCT: 40.6 % (ref 39.0–52.0)
Hemoglobin: 13.4 g/dL (ref 13.0–17.0)
MCH: 29.5 pg (ref 26.0–34.0)
MCHC: 33 g/dL (ref 30.0–36.0)
MCV: 89.4 fL (ref 80.0–100.0)
Platelets: 255 10*3/uL (ref 150–400)
RBC: 4.54 MIL/uL (ref 4.22–5.81)
RDW: 14.6 % (ref 11.5–15.5)
WBC: 6.5 10*3/uL (ref 4.0–10.5)
nRBC: 0 % (ref 0.0–0.2)

## 2020-04-14 LAB — TSH: TSH: 1.693 u[IU]/mL (ref 0.350–4.500)

## 2020-04-14 LAB — MAGNESIUM: Magnesium: 2.1 mg/dL (ref 1.7–2.4)

## 2020-04-14 LAB — BASIC METABOLIC PANEL
Anion gap: 10 (ref 5–15)
BUN: 29 mg/dL — ABNORMAL HIGH (ref 8–23)
CO2: 25 mmol/L (ref 22–32)
Calcium: 9.3 mg/dL (ref 8.9–10.3)
Chloride: 106 mmol/L (ref 98–111)
Creatinine, Ser: 1.33 mg/dL — ABNORMAL HIGH (ref 0.61–1.24)
GFR, Estimated: 53 mL/min — ABNORMAL LOW (ref >60)
Glucose, Bld: 104 mg/dL — ABNORMAL HIGH (ref 70–99)
Potassium: 4.6 mmol/L (ref 3.5–5.1)
Sodium: 141 mmol/L (ref 135–145)

## 2020-04-14 NOTE — Progress Notes (Signed)
Primary Care Physician: Bernerd Limbo, MD Referring Physician: Dr. Raynald Blend Carl Harper is a 86 y.o. male with a h/o paroxysmal afib that is in the afib clinic, for persistent afib of unknown duration. He was in afib with his visit with Dr. Curt Bears one month ago but was not ready to go thru with cardioversion  at that point. He does have some chronic LLE but has been worse recently. His weight is fairly stable. He is taking lasix 20 mg bid. He is drinking 2 glasses of wine daily. He states no missed anticoagulation for  the last 3 weeks. He is rate controlled. He was on amiodarone at one point and this was stopped due to corneal deposits. He is now on mexiletine.   Today, he denies symptoms of palpitations, chest pain, shortness of breath, orthopnea, PND, lower extremity edema, dizziness, presyncope, syncope, or neurologic sequela. The patient is tolerating medications without difficulties and is otherwise without complaint today.   Past Medical History:  Diagnosis Date  . Benign neoplasm of colon 10/24/2006  . BPH (benign prostatic hyperplasia)   . Daily headache   . Diverticulosis 09/03.2008  . Dysrhythmia    "irregular"  . Hypertension   . Internal hemorrhoids without mention of complication 40/81/4481  . Memory loss   . PAD (peripheral artery disease) ([place])   . Restless leg syndrome   . Sleep apnea    dx'd; wore mask; lost weight; turned in machine"   Past Surgical History:  Procedure Laterality Date  . APPENDECTOMY  ~ 1954  . ATHERECTOMY Right 11/07/2011   Procedure: ATHERECTOMY;  Surgeon: Lorretta Harp, MD;  Location: [facility] CATH LAB;  Service: Cardiovascular;  Laterality: Right;  . CARDIAC CATHETERIZATION  03/30/1999   non-critical CAD  . CATARACT EXTRACTION W/ INTRAOCULAR LENS  IMPLANT, BILATERAL  2011-2012  . ILIAC ARTERY STENT Right 11/07/2011   Diamondback orbital rotational atherectomy, PTA & stenting of calcified R CIA (Dr. Adora Fridge)  . LOWER EXTREMITY ANGIOGRAM  N/A 11/07/2011   Procedure: LOWER EXTREMITY ANGIOGRAM;  Surgeon: Lorretta Harp, MD;  Location: [facility] CATH LAB;  Service: Cardiovascular;  Laterality: N/A;  . NM MYOCAR PERF WALL MOTION  09/2012   lexiscan myoview - low risk with fixed inferior defect with underlying bowel attenuation suggestive of artifact  . PERCUTANEOUS STENT INTERVENTION  11/07/2011   Procedure: PERCUTANEOUS STENT INTERVENTION;  Surgeon: Lorretta Harp, MD;  Location: [facility] CATH LAB;  Service: Cardiovascular;;  . SHOULDER ARTHROSCOPY W/ ROTATOR CUFF REPAIR Left 2010  . Sleep Study  01/09/2011   AHI during total sleep 14.4/hr and during REM 19.4/hr  . TRANSTHORACIC ECHOCARDIOGRAM  09/2012   EF 55-60%, LA mildly dilated    Current Outpatient Medications  Medication Sig Dispense Refill  . alendronate (FOSAMAX) 70 MG tablet Take 70 mg by mouth once a week.    Marland Kitchen aspirin 81 MG tablet Take 81 mg by mouth daily.    . calcium citrate-vitamin D (CITRACAL+D) 315-200 MG-UNIT tablet Take 2 tablets by mouth 2 (two) times daily.    . Cholecalciferol (VITAMIN D3) 50 MCG (2000 UT) CAPS Take by mouth.    Arne Cleveland 5 MG TABS tablet TAKE ONE TABLET BY MOUTH TWICE A DAY 60 tablet 5  . finasteride (PROSCAR) 5 MG tablet Take 5 mg by mouth daily.    . furosemide (LASIX) 20 MG tablet TAKE ONE TABLET BY MOUTH DAILY 90 tablet 3  . isosorbide mononitrate (IMDUR) 30 MG 24 hr tablet  TAKE ONE TABLET BY MOUTH DAILY 30 tablet 5  . losartan (COZAAR) 25 MG tablet TAKE ONE TABLET BY MOUTH DAILY 90 tablet 2  . LUTEIN PO Take 1 tablet by mouth daily.     . metoprolol succinate (TOPROL-XL) 25 MG 24 hr tablet TAKE 1/2 TABLET BY MOUTH DAILY 45 tablet 2  . mexiletine (MEXITIL) 250 MG capsule Take 1 capsule (250 mg total) by mouth 2 (two) times daily. 180 capsule 2  . Multiple Vitamin (MULTIVITAMIN WITH MINERALS) TABS tablet Take 1 tablet by mouth daily.    . Multiple Vitamins-Minerals (PRESERVISION AREDS) CAPS Take 2 capsules by mouth daily.     . nitroGLYCERIN  (NITROSTAT) 0.4 MG SL tablet DISSOLVE 1 TAB UNDER TONGUE FOR CHEST PAIN - IF PAIN REMAINS AFTER 5 MIN, CALL 911 AND REPEAT DOSE. MAX 3 TABS IN 15 MINUTES 25 tablet 4  . pramipexole (MIRAPEX) 0.75 MG tablet Take 1 tablet by mouth daily.    . RESTASIS 0.05 % ophthalmic emulsion Apply 1 drop to eye 2 (two) times daily.    . solifenacin (VESICARE) 5 MG tablet Take 5 mg by mouth daily.    . Tamsulosin HCl (FLOMAX) 0.4 MG CAPS Take 0.4 mg by mouth daily after supper.    . traZODone (DESYREL) 100 MG tablet Take 100 mg by mouth at bedtime.  3  . XIIDRA 5 % SOLN Instill 1 drop Both Eyes twice a day    . Magnesium 200 MG TABS Take 1 tablet (200 mg total) by mouth daily. (Patient not taking: Reported on 04/14/2020) 30 each 4  . MYRBETRIQ 50 MG TB24 tablet Take 1 tablet by mouth daily. (Patient not taking: Reported on 04/14/2020)     No current facility-administered medications for this encounter.    Allergies  Allergen Reactions  . Amiodarone Other (See Comments)    Other reaction(s): Other (See Comments) Corneal deposits Corneal deposits  . Bee Venom Other (See Comments)    unknown    Social History   Socioeconomic History  . Marital status: Married    Spouse name: Not on file  . Number of children: 0  . Years of education: 3  . Highest education level: Not on file  Occupational History  . Occupation: retired    Fish farm manager: [employer]  Tobacco Use  . Smoking status: Former Smoker    Packs/day: 1.00    Years: 55.00    Pack years: 55.00    Types: Cigarettes    Quit date: 06/24/1994    Years since quitting: 25.8  . Smokeless tobacco: Never Used  Vaping Use  . Vaping Use: Never used  Substance and Sexual Activity  . Alcohol use: Yes    Alcohol/week: 14.0 standard drinks    Types: 14 Glasses of wine per week    Comment: 11/07/2011 "couple drinks of wine q night"  . Drug use: No  . Sexual activity: Not Currently  Other Topics Concern  . Not on file  Social History Narrative  . Not  on file   Social Determinants of Health   Financial Resource Strain: Not on file  Food Insecurity: Not on file  Transportation Needs: Not on file  Physical Activity: Not on file  Stress: Not on file  Social Connections: Not on file  Intimate Partner Violence: Not on file    Family History  Problem Relation Age of Onset  . Heart Problems Father 12  . Cancer Brother 68  . Heart failure Brother   . Cancer  Maternal Grandfather        prostate    ROS- All systems are reviewed and negative except as per the HPI above  Physical Exam: Vitals:   04/14/20 1403  BP: 102/60  Pulse: 65  Weight: 81.6 kg  Height: 5\' 10"  (1.778 m)   Wt Readings from Last 3 Encounters:  04/14/20 81.6 kg  03/16/20 82.8 kg  12/12/19 79.4 kg    Labs: Lab Results  Component Value Date   NA 140 03/30/2020   K 4.5 03/30/2020   CL 104 03/30/2020   CO2 23 03/30/2020   GLUCOSE 107 (H) 03/30/2020   BUN 29 (H) 03/30/2020   CREATININE 1.59 (H) 03/30/2020   CALCIUM 8.8 03/30/2020   MG 2.1 06/12/2017   Lab Results  Component Value Date   INR 1.22 10/29/2014   Lab Results  Component Value Date   CHOL 189 04/11/2013   HDL 77 04/11/2013   LDLCALC 100 (H) 04/11/2013   TRIG 59 04/11/2013     GEN- The patient is well appearing, alert and oriented x 3 today.   Head- normocephalic, atraumatic Eyes-  Sclera clear, conjunctiva pink Ears- hearing intact Oropharynx- clear Neck- supple, no JVP Lymph- no cervical lymphadenopathy Lungs- Clear to ausculation bilaterally, normal work of breathing Heart- irregular rate and rhythm, no murmurs, rubs or gallops, PMI not laterally displaced GI- soft, NT, ND, + BS Extremities- no clubbing, cyanosis, or edema MS- no significant deformity or atrophy Skin- no rash or lesion Psych- euthymic mood, full affect Neuro- strength and sensation are intact  EKG-afib at 65 bpm, qrs int 108 ms, qtc 422 ms   Assessment and Plan: 1. Persistent afib  Pt has been in afib  for unknown duration  He was offered a cardioversion in January by Dr. Curt Bears and he was not quite ready to commit He is now ready to have that scheduled He may need AAD if fails DCCV He was placed on mexiletine after taken off amiodarone  Risk vrs benefit discussed Advised to reduce alcohol intake from  2 glasses a day   2. CHA2DS2VASc score of 4 Continue 5 mg bid States no missed doses x 21 days   F/u with afib clinic x one week s/p DCCV  Donna C. Carroll, [facility] [address] [place], [place] [address] [phone]

## 2020-04-14 NOTE — Patient Instructions (Signed)
Cardioversion scheduled for Monday, February 28th  - Arrive at the [facility] and go to admitting at 930AM  - Do not eat or drink anything after midnight the night prior to your procedure.  - Take all your morning medication (except diabetic medications) with a sip of water prior to arrival.  - You will not be able to drive home after your procedure.  - Do NOT miss any doses of your blood thinner - if you should miss a dose please notify our office immediately.  - If you feel as if you go back into normal rhythm prior to scheduled cardioversion, please notify our office immediately. If your procedure is canceled in the cardioversion suite you will be charged a cancellation fee.

## 2020-04-14 NOTE — H&P (View-Only) (Signed)
Primary Care Physician: Bernerd Limbo, MD Referring Physician: Dr. Raynald Blend Carl Harper is a 86 y.o. male with a h/o paroxysmal afib that is in the afib clinic, for persistent afib of unknown duration. He was in afib with his visit with Dr. Curt Bears one month ago but was not ready to go thru with cardioversion  at that point. He does have some chronic LLE but has been worse recently. His weight is fairly stable. He is taking lasix 20 mg bid. He is drinking 2 glasses of wine daily. He states no missed anticoagulation for  the last 3 weeks. He is rate controlled. He was on amiodarone at one point and this was stopped due to corneal deposits. He is now on mexiletine.   Today, he denies symptoms of palpitations, chest pain, shortness of breath, orthopnea, PND, lower extremity edema, dizziness, presyncope, syncope, or neurologic sequela. The patient is tolerating medications without difficulties and is otherwise without complaint today.   Past Medical History:  Diagnosis Date  . Benign neoplasm of colon 10/24/2006  . BPH (benign prostatic hyperplasia)   . Daily headache   . Diverticulosis 09/03.2008  . Dysrhythmia    "irregular"  . Hypertension   . Internal hemorrhoids without mention of complication 53/66/4403  . Memory loss   . PAD (peripheral artery disease) ([place])   . Restless leg syndrome   . Sleep apnea    dx'd; wore mask; lost weight; turned in machine"   Past Surgical History:  Procedure Laterality Date  . APPENDECTOMY  ~ 1954  . ATHERECTOMY Right 11/07/2011   Procedure: ATHERECTOMY;  Surgeon: Lorretta Harp, MD;  Location: [facility] CATH LAB;  Service: Cardiovascular;  Laterality: Right;  . CARDIAC CATHETERIZATION  03/30/1999   non-critical CAD  . CATARACT EXTRACTION W/ INTRAOCULAR LENS  IMPLANT, BILATERAL  2011-2012  . ILIAC ARTERY STENT Right 11/07/2011   Diamondback orbital rotational atherectomy, PTA & stenting of calcified R CIA (Dr. Adora Fridge)  . LOWER EXTREMITY ANGIOGRAM  N/A 11/07/2011   Procedure: LOWER EXTREMITY ANGIOGRAM;  Surgeon: Lorretta Harp, MD;  Location: [facility] CATH LAB;  Service: Cardiovascular;  Laterality: N/A;  . NM MYOCAR PERF WALL MOTION  09/2012   lexiscan myoview - low risk with fixed inferior defect with underlying bowel attenuation suggestive of artifact  . PERCUTANEOUS STENT INTERVENTION  11/07/2011   Procedure: PERCUTANEOUS STENT INTERVENTION;  Surgeon: Lorretta Harp, MD;  Location: [facility] CATH LAB;  Service: Cardiovascular;;  . SHOULDER ARTHROSCOPY W/ ROTATOR CUFF REPAIR Left 2010  . Sleep Study  01/09/2011   AHI during total sleep 14.4/hr and during REM 19.4/hr  . TRANSTHORACIC ECHOCARDIOGRAM  09/2012   EF 55-60%, LA mildly dilated    Current Outpatient Medications  Medication Sig Dispense Refill  . alendronate (FOSAMAX) 70 MG tablet Take 70 mg by mouth once a week.    Marland Kitchen aspirin 81 MG tablet Take 81 mg by mouth daily.    . calcium citrate-vitamin D (CITRACAL+D) 315-200 MG-UNIT tablet Take 2 tablets by mouth 2 (two) times daily.    . Cholecalciferol (VITAMIN D3) 50 MCG (2000 UT) CAPS Take by mouth.    Arne Cleveland 5 MG TABS tablet TAKE ONE TABLET BY MOUTH TWICE A DAY 60 tablet 5  . finasteride (PROSCAR) 5 MG tablet Take 5 mg by mouth daily.    . furosemide (LASIX) 20 MG tablet TAKE ONE TABLET BY MOUTH DAILY 90 tablet 3  . isosorbide mononitrate (IMDUR) 30 MG 24 hr tablet  TAKE ONE TABLET BY MOUTH DAILY 30 tablet 5  . losartan (COZAAR) 25 MG tablet TAKE ONE TABLET BY MOUTH DAILY 90 tablet 2  . LUTEIN PO Take 1 tablet by mouth daily.     . metoprolol succinate (TOPROL-XL) 25 MG 24 hr tablet TAKE 1/2 TABLET BY MOUTH DAILY 45 tablet 2  . mexiletine (MEXITIL) 250 MG capsule Take 1 capsule (250 mg total) by mouth 2 (two) times daily. 180 capsule 2  . Multiple Vitamin (MULTIVITAMIN WITH MINERALS) TABS tablet Take 1 tablet by mouth daily.    . Multiple Vitamins-Minerals (PRESERVISION AREDS) CAPS Take 2 capsules by mouth daily.     . nitroGLYCERIN  (NITROSTAT) 0.4 MG SL tablet DISSOLVE 1 TAB UNDER TONGUE FOR CHEST PAIN - IF PAIN REMAINS AFTER 5 MIN, CALL 911 AND REPEAT DOSE. MAX 3 TABS IN 15 MINUTES 25 tablet 4  . pramipexole (MIRAPEX) 0.75 MG tablet Take 1 tablet by mouth daily.    . RESTASIS 0.05 % ophthalmic emulsion Apply 1 drop to eye 2 (two) times daily.    . solifenacin (VESICARE) 5 MG tablet Take 5 mg by mouth daily.    . Tamsulosin HCl (FLOMAX) 0.4 MG CAPS Take 0.4 mg by mouth daily after supper.    . traZODone (DESYREL) 100 MG tablet Take 100 mg by mouth at bedtime.  3  . XIIDRA 5 % SOLN Instill 1 drop Both Eyes twice a day    . Magnesium 200 MG TABS Take 1 tablet (200 mg total) by mouth daily. (Patient not taking: Reported on 04/14/2020) 30 each 4  . MYRBETRIQ 50 MG TB24 tablet Take 1 tablet by mouth daily. (Patient not taking: Reported on 04/14/2020)     No current facility-administered medications for this encounter.    Allergies  Allergen Reactions  . Amiodarone Other (See Comments)    Other reaction(s): Other (See Comments) Corneal deposits Corneal deposits  . Bee Venom Other (See Comments)    unknown    Social History   Socioeconomic History  . Marital status: Married    Spouse name: Not on file  . Number of children: 0  . Years of education: 64  . Highest education level: Not on file  Occupational History  . Occupation: retired    Fish farm manager: [employer]  Tobacco Use  . Smoking status: Former Smoker    Packs/day: 1.00    Years: 55.00    Pack years: 55.00    Types: Cigarettes    Quit date: 06/24/1994    Years since quitting: 25.8  . Smokeless tobacco: Never Used  Vaping Use  . Vaping Use: Never used  Substance and Sexual Activity  . Alcohol use: Yes    Alcohol/week: 14.0 standard drinks    Types: 14 Glasses of wine per week    Comment: 11/07/2011 "couple drinks of wine q night"  . Drug use: No  . Sexual activity: Not Currently  Other Topics Concern  . Not on file  Social History Narrative  . Not  on file   Social Determinants of Health   Financial Resource Strain: Not on file  Food Insecurity: Not on file  Transportation Needs: Not on file  Physical Activity: Not on file  Stress: Not on file  Social Connections: Not on file  Intimate Partner Violence: Not on file    Family History  Problem Relation Age of Onset  . Heart Problems Father 106  . Cancer Brother 95  . Heart failure Brother   . Cancer  Maternal Grandfather        prostate    ROS- All systems are reviewed and negative except as per the HPI above  Physical Exam: Vitals:   04/14/20 1403  BP: 102/60  Pulse: 65  Weight: 81.6 kg  Height: 5\' 10"  (1.778 m)   Wt Readings from Last 3 Encounters:  04/14/20 81.6 kg  03/16/20 82.8 kg  12/12/19 79.4 kg    Labs: Lab Results  Component Value Date   NA 140 03/30/2020   K 4.5 03/30/2020   CL 104 03/30/2020   CO2 23 03/30/2020   GLUCOSE 107 (H) 03/30/2020   BUN 29 (H) 03/30/2020   CREATININE 1.59 (H) 03/30/2020   CALCIUM 8.8 03/30/2020   MG 2.1 06/12/2017   Lab Results  Component Value Date   INR 1.22 10/29/2014   Lab Results  Component Value Date   CHOL 189 04/11/2013   HDL 77 04/11/2013   LDLCALC 100 (H) 04/11/2013   TRIG 59 04/11/2013     GEN- The patient is well appearing, alert and oriented x 3 today.   Head- normocephalic, atraumatic Eyes-  Sclera clear, conjunctiva pink Ears- hearing intact Oropharynx- clear Neck- supple, no JVP Lymph- no cervical lymphadenopathy Lungs- Clear to ausculation bilaterally, normal work of breathing Heart- irregular rate and rhythm, no murmurs, rubs or gallops, PMI not laterally displaced GI- soft, NT, ND, + BS Extremities- no clubbing, cyanosis, or edema MS- no significant deformity or atrophy Skin- no rash or lesion Psych- euthymic mood, full affect Neuro- strength and sensation are intact  EKG-afib at 65 bpm, qrs int 108 ms, qtc 422 ms   Assessment and Plan: 1. Persistent afib  Pt has been in afib  for unknown duration  He was offered a cardioversion in January by Dr. Curt Bears and he was not quite ready to commit He is now ready to have that scheduled He may need AAD if fails DCCV He was placed on mexiletine after taken off amiodarone  Risk vrs benefit discussed Advised to reduce alcohol intake from  2 glasses a day   2. CHA2DS2VASc score of 4 Continue 5 mg bid States no missed doses x 21 days   F/u with afib clinic x one week s/p DCCV  Avaley Coop C. Jennalynn Rivard, [facility] [address] [place], [place] [address] [phone]

## 2020-04-16 ENCOUNTER — Ambulatory Visit (HOSPITAL_COMMUNITY): Payer: Medicare HMO | Admitting: Nurse Practitioner

## 2020-04-17 ENCOUNTER — Other Ambulatory Visit (HOSPITAL_COMMUNITY)
Admission: RE | Admit: 2020-04-17 | Discharge: 2020-04-17 | Disposition: A | Discharge status: Home / Self Care | Payer: Medicare HMO | Source: Ambulatory Visit | Attending: Internal Medicine | Admitting: Internal Medicine

## 2020-04-17 DIAGNOSIS — Z20822 Contact with and (suspected) exposure to covid-19: Secondary | ICD-10-CM | POA: Diagnosis not present

## 2020-04-17 DIAGNOSIS — Z01812 Encounter for preprocedural laboratory examination: Secondary | ICD-10-CM | POA: Insufficient documentation

## 2020-04-17 LAB — SARS CORONAVIRUS 2 (TAT 6-24 HRS): SARS Coronavirus 2: NEGATIVE

## 2020-04-19 ENCOUNTER — Ambulatory Visit (HOSPITAL_COMMUNITY)
Admission: RE | Admit: 2020-04-19 | Discharge: 2020-04-19 | Disposition: A | Discharge status: Home / Self Care | Payer: Medicare HMO | Attending: Internal Medicine | Admitting: Internal Medicine

## 2020-04-19 ENCOUNTER — Encounter (HOSPITAL_COMMUNITY): Payer: Self-pay | Admitting: Internal Medicine

## 2020-04-19 ENCOUNTER — Encounter (HOSPITAL_COMMUNITY)
Admission: RE | Disposition: A | Discharge status: Home / Self Care | Payer: Self-pay | Source: Home / Self Care | Attending: Internal Medicine

## 2020-04-19 ENCOUNTER — Ambulatory Visit (HOSPITAL_COMMUNITY): Payer: Medicare HMO | Admitting: Anesthesiology

## 2020-04-19 ENCOUNTER — Other Ambulatory Visit: Payer: Self-pay

## 2020-04-19 DIAGNOSIS — Z7901 Long term (current) use of anticoagulants: Secondary | ICD-10-CM | POA: Diagnosis not present

## 2020-04-19 DIAGNOSIS — Z87891 Personal history of nicotine dependence: Secondary | ICD-10-CM | POA: Insufficient documentation

## 2020-04-19 DIAGNOSIS — I4819 Other persistent atrial fibrillation: Secondary | ICD-10-CM | POA: Diagnosis present

## 2020-04-19 DIAGNOSIS — Z79899 Other long term (current) drug therapy: Secondary | ICD-10-CM | POA: Diagnosis not present

## 2020-04-19 DIAGNOSIS — I4891 Unspecified atrial fibrillation: Secondary | ICD-10-CM

## 2020-04-19 DIAGNOSIS — Z7982 Long term (current) use of aspirin: Secondary | ICD-10-CM | POA: Diagnosis not present

## 2020-04-19 HISTORY — PX: CARDIOVERSION: SHX1299

## 2020-04-19 SURGERY — CARDIOVERSION
Anesthesia: General

## 2020-04-19 MED ORDER — PROPOFOL 10 MG/ML IV BOLUS
INTRAVENOUS | Status: DC | PRN
  Administered 2020-04-19: 10 mg via INTRAVENOUS
  Administered 2020-04-19: 30 mg via INTRAVENOUS

## 2020-04-19 MED ORDER — SODIUM CHLORIDE 0.9 % IV SOLN: INTRAVENOUS | Status: DC | PRN

## 2020-04-19 NOTE — Discharge Instructions (Signed)
Electrical Cardioversion Electrical cardioversion is the delivery of a jolt of electricity to restore a normal rhythm to the heart. A rhythm that is too fast or is not regular keeps the heart from pumping well. In this procedure, sticky patches or metal paddles are placed on the chest to deliver electricity to the heart from a device.  ACTIVITY: Your care partner should take you home directly after the procedure. You should plan to take it easy, moving slowly for the rest of the day. You can resume normal activity the day after the procedure however YOU SHOULD NOT DRIVE, use power tools, machinery or perform tasks that involve climbing or major physical exertion for 24 hours (because of the sedation medicines used during the test).   Follow these instructions at home: A cardiologist can be reached at any hour. Please call [phone] for any of the following symptoms:    You may have some redness on the skin where the shocks were given.  You may apply over-the-counter hydrocortisone cream or aloe vera to alleviate irritation.  Take over-the-counter and prescription medicines only as told by your health care provider.  Ask your health care provider how to check your pulse. Check it often.  Rest for 48 hours after the procedure or as told by your health care provider.  Avoid or limit your caffeine use as told by your health care provider.  Keep all follow-up visits as told by your health care provider. This is important.  FOLLOW UP:  Please also call with any specific questions about appointments or follow up tests.   If this information does not answer your questions, please call [facility] office at [phone] to clarify.

## 2020-04-19 NOTE — CV Procedure (Signed)
CARDIOVERSION  Pt sedated by anesthesia with IV lidocaine and Propofol  With pads in AP position, patient cardioverted to SR with 200 J synchronized biphasic energy  Procedure was without complications  12 lead EKG pending  [name] MD

## 2020-04-19 NOTE — Interval H&P Note (Signed)
History and Physical Interval Note:  04/19/2020 10:04 AM  Carl Harper  has presented today for surgery, with the diagnosis of A-FIB.  The various methods of treatment have been discussed with the patient and family. After consideration of risks, benefits and other options for treatment, the patient has consented to  Procedure(s): CARDIOVERSION (N/A) as a surgical intervention.  The patient's history has been reviewed, patient examined, no change in status, stable for surgery.  I have reviewed the patient's chart and labs.  Questions were answered to the patient's satisfaction.     [name]

## 2020-04-19 NOTE — Transfer of Care (Signed)
Immediate Anesthesia Transfer of Care Note  Patient: Carl Harper  Procedure(s) Performed: CARDIOVERSION (N/A )  Patient Location: Endoscopy Unit  Anesthesia Type:General  Level of Consciousness: awake, alert  and drowsy  Airway & Oxygen Therapy: Patient Spontanous Breathing  Post-op Assessment: Report given to RN, Post -op Vital signs reviewed and stable and Patient moving all extremities  Post vital signs: Reviewed and stable  Last Vitals:  Vitals Value Taken Time  BP 97/55   Temp    Pulse 40   Resp 14   SpO2 98     Last Pain:  Vitals:   04/19/20 0942  TempSrc: Oral  PainSc: 0-No pain         Complications: No complications documented.

## 2020-04-19 NOTE — Anesthesia Preprocedure Evaluation (Signed)
Anesthesia Evaluation  Patient identified by MRN, date of birth, ID band Patient awake    Reviewed: Allergy & Precautions, NPO status , Patient's Chart, lab work & pertinent test results  Airway Mallampati: II  TM Distance: >3 FB     Dental  (+) Dental Advisory Given   Pulmonary sleep apnea , former smoker,    breath sounds clear to auscultation       Cardiovascular hypertension, + CAD and + Peripheral Vascular Disease  + dysrhythmias Atrial Fibrillation  Rhythm:Irregular Rate:Normal  Normal EF. Valves WNL   Neuro/Psych negative neurological ROS     GI/Hepatic negative GI ROS, Neg liver ROS,   Endo/Other  negative endocrine ROS  Renal/GU CRFRenal disease     Musculoskeletal   Abdominal   Peds  Hematology negative hematology ROS (+)   Anesthesia Other Findings   Reproductive/Obstetrics                             Anesthesia Physical Anesthesia Plan  ASA: II  Anesthesia Plan: General   Post-op Pain Management:    Induction: Intravenous  PONV Risk Score and Plan: 2 and Treatment may vary due to age or medical condition  Airway Management Planned: Natural Airway and Mask  Additional Equipment:   Intra-op Plan:   Post-operative Plan:   Informed Consent: I have reviewed the patients History and Physical, chart, labs and discussed the procedure including the risks, benefits and alternatives for the proposed anesthesia with the patient or authorized representative who has indicated his/her understanding and acceptance.       Plan Discussed with:   Anesthesia Plan Comments:         Anesthesia Quick Evaluation

## 2020-04-19 NOTE — Anesthesia Procedure Notes (Addendum)
Procedure Name: General with mask airway Date/Time: 04/19/2020 10:13 AM Performed by: Rande Brunt, CRNA Pre-anesthesia Checklist: Patient identified, Emergency Drugs available, Suction available, Patient being monitored and Timeout performed Patient Re-evaluated:Patient Re-evaluated prior to induction Oxygen Delivery Method: Ambu bag Preoxygenation: Pre-oxygenation with 100% oxygen Induction Type: IV induction Placement Confirmation: positive ETCO2 Dental Injury: Teeth and Oropharynx as per pre-operative assessment

## 2020-04-20 ENCOUNTER — Encounter (HOSPITAL_COMMUNITY): Payer: Self-pay | Admitting: Internal Medicine

## 2020-04-20 NOTE — Anesthesia Postprocedure Evaluation (Signed)
Anesthesia Post Note  Patient: Carl Harper  Procedure(s) Performed: CARDIOVERSION (N/A )     Patient location during evaluation: Endoscopy Anesthesia Type: General Level of consciousness: awake and alert Pain management: pain level controlled Vital Signs Assessment: post-procedure vital signs reviewed and stable Respiratory status: spontaneous breathing, nonlabored ventilation, respiratory function stable and patient connected to nasal cannula oxygen Cardiovascular status: stable Postop Assessment: no apparent nausea or vomiting Anesthetic complications: no   No complications documented.  Last Vitals:  Vitals:   04/19/20 1052 04/19/20 1106  BP: (!) 99/42 (!) 107/58  Pulse: (!) 38 (!) 42  Resp: 15 18  Temp:    SpO2: 98% 100%    Last Pain:  Vitals:   04/19/20 1106  TempSrc:   PainSc: 0-No pain                 Weltha Cathy

## 2020-04-28 ENCOUNTER — Encounter (HOSPITAL_COMMUNITY): Payer: Self-pay | Admitting: Nurse Practitioner

## 2020-04-28 ENCOUNTER — Other Ambulatory Visit: Payer: Self-pay

## 2020-04-28 ENCOUNTER — Ambulatory Visit (HOSPITAL_COMMUNITY)
Admission: RE | Admit: 2020-04-28 | Discharge: 2020-04-28 | Disposition: A | Discharge status: Home / Self Care | Payer: Medicare HMO | Source: Ambulatory Visit | Attending: Nurse Practitioner | Admitting: Nurse Practitioner

## 2020-04-28 VITALS — BP 114/64 | HR 55 | Ht 70.0 in | Wt 178.6 lb

## 2020-04-28 DIAGNOSIS — Z7901 Long term (current) use of anticoagulants: Secondary | ICD-10-CM | POA: Diagnosis not present

## 2020-04-28 DIAGNOSIS — Z79899 Other long term (current) drug therapy: Secondary | ICD-10-CM | POA: Insufficient documentation

## 2020-04-28 DIAGNOSIS — I4819 Other persistent atrial fibrillation: Secondary | ICD-10-CM | POA: Insufficient documentation

## 2020-04-28 DIAGNOSIS — H18009 Unspecified corneal deposit, unspecified eye: Secondary | ICD-10-CM | POA: Insufficient documentation

## 2020-04-28 DIAGNOSIS — R600 Localized edema: Secondary | ICD-10-CM | POA: Diagnosis not present

## 2020-04-28 DIAGNOSIS — N401 Enlarged prostate with lower urinary tract symptoms: Secondary | ICD-10-CM | POA: Insufficient documentation

## 2020-04-28 DIAGNOSIS — D6869 Other thrombophilia: Secondary | ICD-10-CM | POA: Diagnosis not present

## 2020-04-28 DIAGNOSIS — T462X5D Adverse effect of other antidysrhythmic drugs, subsequent encounter: Secondary | ICD-10-CM | POA: Insufficient documentation

## 2020-04-28 DIAGNOSIS — Z7983 Long term (current) use of bisphosphonates: Secondary | ICD-10-CM | POA: Diagnosis not present

## 2020-04-28 DIAGNOSIS — Z7982 Long term (current) use of aspirin: Secondary | ICD-10-CM | POA: Diagnosis not present

## 2020-04-28 DIAGNOSIS — R3911 Hesitancy of micturition: Secondary | ICD-10-CM | POA: Insufficient documentation

## 2020-04-28 MED ORDER — FUROSEMIDE 20 MG PO TABS: ORAL_TABLET | ORAL | 3 refills | Status: DC

## 2020-04-28 NOTE — Patient Instructions (Signed)
Increase Lasix 30mg  for 3 days. If Swelling and Color changes follow-up with Dr. Debara Pickett. Urinary doesn't change follow-up with PCP.

## 2020-04-28 NOTE — Progress Notes (Signed)
Primary Care Physician: Bernerd Limbo, MD Referring Physician: Dr. Raynald Blend Carl Harper is a 86 y.o. male with a h/o paroxysmal afib that is in the afib clinic, for persistent afib of unknown duration. He was in afib with his visit with Dr. Curt Bears one month ago but was not ready to go thru with cardioversion  at that point. He does have some chronic LLE but has been worse recently. His weight is fairly stable. He is taking lasix 20 mg bid. He is drinking 2 glasses of wine daily. He states no missed anticoagulation for  the last 3 weeks. He is rate controlled. He was on amiodarone at one point and this was stopped due to corneal deposits. He is now on mexiletine.   Pt had a successful cardioversion, 2/28 and remains in SR today. He is stating that he chronic swollen legs have appeared to be  more swollen lately since he has resumed SR. He also has had noted his legs appear bright red in the am. Improves with getting up and walking. He has vascular disease and is followed by Dr. Debara Pickett. He has noted  some urinary hesitancy as well, does have issues with BPH. .   Today, he denies symptoms of palpitations, chest pain, shortness of breath, orthopnea, PND, lower extremity edema, dizziness, presyncope, syncope, or neurologic sequela. The patient is tolerating medications without difficulties and is otherwise without complaint today.   Past Medical History:  Diagnosis Date  . Benign neoplasm of colon 10/24/2006  . BPH (benign prostatic hyperplasia)   . Daily headache   . Diverticulosis 09/03.2008  . Dysrhythmia    "irregular"  . Hypertension   . Internal hemorrhoids without mention of complication 01/60/1093  . Memory loss   . PAD (peripheral artery disease) ([place])   . Restless leg syndrome   . Sleep apnea    dx'd; wore mask; lost weight; turned in machine"   Past Surgical History:  Procedure Laterality Date  . APPENDECTOMY  ~ 1954  . ATHERECTOMY Right 11/07/2011   Procedure:  ATHERECTOMY;  Surgeon: Lorretta Harp, MD;  Location: [facility] CATH LAB;  Service: Cardiovascular;  Laterality: Right;  . CARDIAC CATHETERIZATION  03/30/1999   non-critical CAD  . CARDIOVERSION N/A 04/19/2020   Procedure: CARDIOVERSION;  Surgeon: Fay Records, MD;  Location: [facility];  Service: Cardiovascular;  Laterality: N/A;  . CATARACT EXTRACTION W/ INTRAOCULAR LENS  IMPLANT, BILATERAL  2011-2012  . ILIAC ARTERY STENT Right 11/07/2011   Di[place]back orbital rotational atherectomy, PTA & stenting of calcified R CIA (Dr. Adora Fridge)  . LOWER EXTREMITY ANGIOGRAM N/A 11/07/2011   Procedure: LOWER EXTREMITY ANGIOGRAM;  Surgeon: Lorretta Harp, MD;  Location: [facility] CATH LAB;  Service: Cardiovascular;  Laterality: N/A;  . NM MYOCAR PERF WALL MOTION  09/2012   lexiscan myoview - low risk with fixed inferior defect with underlying bowel attenuation suggestive of artifact  . PERCUTANEOUS STENT INTERVENTION  11/07/2011   Procedure: PERCUTANEOUS STENT INTERVENTION;  Surgeon: Lorretta Harp, MD;  Location: [facility] CATH LAB;  Service: Cardiovascular;;  . SHOULDER ARTHROSCOPY W/ ROTATOR CUFF REPAIR Left 2010  . Sleep Study  01/09/2011   AHI during total sleep 14.4/hr and during REM 19.4/hr  . TRANSTHORACIC ECHOCARDIOGRAM  09/2012   EF 55-60%, LA mildly dilated    Current Outpatient Medications  Medication Sig Dispense Refill  . alendronate (FOSAMAX) 70 MG tablet Take 70 mg by mouth every Sunday.    Marland Kitchen aspirin 81 MG tablet  Take 81 mg by mouth daily.    . Calcium Citrate 250 MG TABS Take 1-2 tablets by mouth See admin instructions. 2 tabs twice daily, 1 tab at noon    . Cholecalciferol (VITAMIN D3) 50 MCG (2000 UT) CAPS Take 2,000 Units by mouth daily.    Marland Kitchen ELIQUIS 5 MG TABS tablet TAKE ONE TABLET BY MOUTH TWICE A DAY 60 tablet 5  . finasteride (PROSCAR) 5 MG tablet Take 5 mg by mouth daily.    . isosorbide mononitrate (IMDUR) 30 MG 24 hr tablet TAKE ONE TABLET BY MOUTH DAILY (Patient taking differently: Take 15 mg  by mouth daily.) 30 tablet 5  . losartan (COZAAR) 25 MG tablet TAKE ONE TABLET BY MOUTH DAILY 90 tablet 2  . LUTEIN PO Take 20 mg by mouth daily.    . metoprolol succinate (TOPROL-XL) 25 MG 24 hr tablet TAKE 1/2 TABLET BY MOUTH DAILY 45 tablet 2  . mexiletine (MEXITIL) 250 MG capsule Take 1 capsule (250 mg total) by mouth 2 (two) times daily. 180 capsule 2  . Multiple Vitamin (MULTIVITAMIN WITH MINERALS) TABS tablet Take 1 tablet by mouth daily.    . Multiple Vitamins-Minerals (PRESERVISION AREDS) CAPS Take 1 capsule by mouth in the morning and at bedtime.    . nitroGLYCERIN (NITROSTAT) 0.4 MG SL tablet DISSOLVE 1 TAB UNDER TONGUE FOR CHEST PAIN - IF PAIN REMAINS AFTER 5 MIN, CALL 911 AND REPEAT DOSE. MAX 3 TABS IN 15 MINUTES 25 tablet 4  . pramipexole (MIRAPEX) 0.75 MG tablet Take 0.75 mg by mouth daily.    . RESTASIS 0.05 % ophthalmic emulsion Apply 1 drop to eye 2 (two) times daily.    . solifenacin (VESICARE) 5 MG tablet Take 5 mg by mouth daily.    . Tamsulosin HCl (FLOMAX) 0.4 MG CAPS Take 0.4 mg by mouth in the morning and at bedtime.    . traZODone (DESYREL) 100 MG tablet Take 100 mg by mouth at bedtime.  3  . XIIDRA 5 % SOLN Place 1 drop into both eyes in the morning and at bedtime.    . furosemide (LASIX) 20 MG tablet Take 30mg  for 3 days then decrease to 20mg  daily 90 tablet 3   No current facility-administered medications for this encounter.    Allergies  Allergen Reactions  . Amiodarone Other (See Comments)    Other reaction(s): Other (See Comments) Corneal deposits Corneal deposits  . Bee Venom Other (See Comments)    unknown    Social History   Socioeconomic History  . Marital status: Married    Spouse name: Not on file  . Number of children: 0  . Years of education: 13  . Highest education level: Not on file  Occupational History  . Occupation: retired    Fish farm manager: [employer]  Tobacco Use  . Smoking status: Former Smoker    Packs/day: 1.00    Years: 55.00     Pack years: 55.00    Types: Cigarettes    Quit date: 06/24/1994    Years since quitting: 25.8  . Smokeless tobacco: Never Used  Vaping Use  . Vaping Use: Never used  Substance and Sexual Activity  . Alcohol use: Yes    Alcohol/week: 14.0 standard drinks    Types: 14 Glasses of wine per week    Comment: 11/07/2011 "couple drinks of wine q night"  . Drug use: No  . Sexual activity: Not Currently  Other Topics Concern  . Not on file  Social  History Narrative  . Not on file   Social Determinants of Health   Financial Resource Strain: Not on file  Food Insecurity: Not on file  Transportation Needs: Not on file  Physical Activity: Not on file  Stress: Not on file  Social Connections: Not on file  Intimate Partner Violence: Not on file    Family History  Problem Relation Age of Onset  . Heart Problems Father 52  . Cancer Brother 23  . Heart failure Brother   . Cancer Maternal Grandfather        prostate    ROS- All systems are reviewed and negative except as per the HPI above  Physical Exam: Vitals:   04/28/20 1440  BP: 114/64  Pulse: (!) 55  Weight: 81 kg  Height: 5\' 10"  (1.778 m)   Wt Readings from Last 3 Encounters:  04/28/20 81 kg  04/14/20 81.6 kg  03/16/20 82.8 kg    Labs: Lab Results  Component Value Date   NA 141 04/14/2020   K 4.6 04/14/2020   CL 106 04/14/2020   CO2 25 04/14/2020   GLUCOSE 104 (H) 04/14/2020   BUN 29 (H) 04/14/2020   CREATININE 1.33 (H) 04/14/2020   CALCIUM 9.3 04/14/2020   MG 2.1 04/14/2020   Lab Results  Component Value Date   INR 1.22 10/29/2014   Lab Results  Component Value Date   CHOL 189 04/11/2013   HDL 77 04/11/2013   LDLCALC 100 (H) 04/11/2013   TRIG 59 04/11/2013     GEN- The patient is well appearing, alert and oriented x 3 today.   Head- normocephalic, atraumatic Eyes-  Sclera clear, conjunctiva pink Ears- hearing intact Oropharynx- clear Neck- supple, no JVP Lymph- no cervical  lymphadenopathy Lungs- Clear to ausculation bilaterally, normal work of breathing Heart- irregular rate and rhythm, no murmurs, rubs or gallops, PMI not laterally displaced GI- soft, NT, ND, + BS Extremities- no clubbing, cyanosis, or 1+ edema MS- no significant deformity or atrophy Skin- no rash or lesion Psych- euthymic mood, full affect Neuro- strength and sensation are intact  EKG-sinus brady at 55 bpm, pr int 270 ms, qrs int 116 ms, qtc 397 ms   Assessment and Plan: 1. Persistent afib  Pt had been in afib for unknown duration  He was offered a cardioversion in January by Dr. Curt Bears and he was not quite ready to commit He did have successful cardioversion and remains in SR  He continues  on mexiletine after taken off amiodarone  Advised to reduce alcohol intake from  2 glasses a day   2. CHA2DS2VASc score of 4 Continue 5 mg bid  3. LLE Acute on chronic Increase lasix to 30 mg x 3 days  If color and swelling do not improve f/u with Dr. Debara Pickett   4. Urinary hesitancy H/o of BPH F/u with PCP if not improving     F/u with Dr. Curt Bears as scheduled 05/27/20   Geroge Baseman. Semya Klinke, [facility] [address] [place], [place] [address] [phone]

## 2020-05-03 ENCOUNTER — Telehealth: Payer: Self-pay | Admitting: Internal Medicine

## 2020-05-03 NOTE — Telephone Encounter (Signed)
Wife updated with MD's recommendations and agreeable to plan. Appointment scheduled for 3/17 at 2:45 pm.

## 2020-05-03 NOTE — Telephone Encounter (Addendum)
Spoke with wife who report pt continue experience bilateral feet and leg swelling and has gained roughly 3-4 within a week. She state pt was initially instructed to take Lasix 30 mg for 3 days by Roderic Palau, NP but felt it didn't help. She report for the past 3 days pt has been taking 40 mg daily but swelling is only slightly better. Wife state she is concerned because pt's feet is very puffy and has fallen a couple times. He also has some SOB with exertion.  Will route to MD for recommendations.

## 2020-05-03 NOTE — Telephone Encounter (Signed)
Pt c/o swelling: STAT is pt has developed SOB within 24 hours  1) How much weight have you gained and in what time span? Patient states that weight stays within 2-3 pounds but he did not weigh himself today  2) If swelling, where is the swelling located? Both legs and feet and she thinks in his abdomen as well.   3) Are you currently taking a fluid pill? Yes, furosemide 2x daily  4) Are you currently SOB? no  5) Do you have a log of your daily weights (if so, list)? no  6) Have you gained 3 pounds in a day or 5 pounds in a week? no  7) Have you traveled recently? no

## 2020-05-03 NOTE — Telephone Encounter (Signed)
Probably needs an office visit - I can see him at 2:45 on 3/17 (DOD slot) - if that works for him.  Dr Lemmie Evens

## 2020-05-06 ENCOUNTER — Encounter: Payer: Self-pay | Admitting: Internal Medicine

## 2020-05-06 ENCOUNTER — Other Ambulatory Visit: Payer: Self-pay

## 2020-05-06 ENCOUNTER — Ambulatory Visit (INDEPENDENT_AMBULATORY_CARE_PROVIDER_SITE_OTHER): Payer: Medicare HMO | Admitting: Internal Medicine

## 2020-05-06 VITALS — BP 95/43 | HR 45 | Ht 70.0 in | Wt 178.0 lb

## 2020-05-06 DIAGNOSIS — R600 Localized edema: Secondary | ICD-10-CM

## 2020-05-06 DIAGNOSIS — R0602 Shortness of breath: Secondary | ICD-10-CM

## 2020-05-06 DIAGNOSIS — I10 Essential (primary) hypertension: Secondary | ICD-10-CM | POA: Diagnosis not present

## 2020-05-06 DIAGNOSIS — I712 Thoracic aortic aneurysm, without rupture, unspecified: Secondary | ICD-10-CM

## 2020-05-06 DIAGNOSIS — I2510 Atherosclerotic heart disease of native coronary artery without angina pectoris: Secondary | ICD-10-CM | POA: Diagnosis not present

## 2020-05-06 MED ORDER — FUROSEMIDE 20 MG PO TABS: 40.0000 mg | ORAL_TABLET | Freq: Two times a day (BID) | ORAL | 11 refills | Status: DC

## 2020-05-06 NOTE — Progress Notes (Signed)
OFFICE NOTE  Chief Complaint:  Leg edema  Primary Care Physician: Bernerd Limbo, MD  HPI:  Carl Harper is a pleasant 86 year old male previously followed Dr. Rollene Fare with a history of peripheral arterial disease. He underwent diamondback orbital rotational atherectomy by Dr. Gwenlyn Found in 2013 with stenting of the calcified right common iliac stenosis. He has mild nonobstructive coronary disease by cath in 2001 and a negative Myoview in 2011. He also has obstructive sleep apnea on CPAP has had reflux symptoms and atypical chest pain from time to time.  His echocardiogram does show mild concentric LVH and borderline aortic root dilatation with a possible small ascending aortic aneurysm which will need to be followed. At his last visit with Dr. Rollene Fare his Toprol was cut down to 25 mg alternating with 12.5 mg every 2 some bradycardia.   Carl Harper was recently seen by Dr. Percival Spanish in the office for shortness of breath and palpitations as well as exertional dyspnea. He wore the monitor however that was never performed as no monitors were available. He did undergo an exercise tolerance test for which he exercised for 6 minutes and 7 metabolic equivalents. There was no evidence of ischemia. He did have PVCs and a fairly flat blood pressure response to exercise. There was marked dyspnea with exertion. He also is reported recent tremors, some memory loss and instability with his gait.  Carl Harper returns today and is complaining of a sinking spell his chest. When pressed further he felt like he was possibly presyncopal occasionally has trouble getting his breath. He has some occasional dizziness. He was evaluated for monitor however none was placed because there was not a monitor available. He did have his metoprolol increased to 12.5/25 mg every other day. He seems to think that this is helped his symptoms and feels better than he had when he saw Dr. Percival Spanish.  I saw Carl Harper back in the office  today. He says that he call and make an appointment about a month ago because his been feeling some episodes of chest discomfort in the morning. He's also felt somewhat short of breath. An EKG in the office today demonstrates new onset atrial flutter with ventricular bigeminy at a rate of 77. This is a new diagnosis for him. I spent greater than 10 minutes discussing atrial fibrillation and anticoagulation options with him. He understands his options and the need for trying to establish a sinus rhythm.  Carl Harper returns today the office for follow-up. His EKG today fortunately shows sinus rhythm with PVCs. This is good news as we will not need to schedule him for a cardioversion. He has been taking Eliquis and feels that this is tolerable. He denies any bleeding problems. Here she does feel improvement in energy and I suspect this is from converting back to sinus rhythm at some point. Based on his TIA event, I suspect it may been related to paroxysmal atrial flutter. He did have carotid Dopplers which show very mild bilateral carotid disease and are not likely the source of his TIA event.  I saw Carl Harper back today in the office. He is maintaining sinus bradycardia with first degree AV block at a rate of 52. He denies any bleeding problems on Eliquis. He is on low-dose amiodarone. This was started by Dr. Curt Bears on 12/30/14, after monitor was placed which showed a very high burden of PVCs greater than 30% as well as A. fib. A repeat echo was performed which showed preserved LV  systolic function. Mr. Meeker was symptomatic with his PVCs. He reports a marked reduction in his symptoms after starting the amiodarone. I do not see these had baseline pulmonary function testing, thyroid or liver function tests.  11/09/2015  Carl Harper returns today for follow-up. He has had recent worsening shortness of breath and significant fatigue and leg weakness with exercise. This is of fairly recent finding. He was  seen by Dr. Curt Bears, who noted that recently Mr. Weekley ophthalmologist had recommended discontinuing amiodarone due to corneal deposits. Since that time, he has been having a washout of the amiodarone with the plan to reassess his burden of PVCs by monitoring at the end of September. This is a ready been scheduled. His last echo was in 2016 and showed normal LV function, but he has had new symptoms as described above and would likely benefit from a repeat echo to rule out cardiomyopathy.  02/04/2016  Carl Harper returns today for follow-up. He has been seen by Dr. Curt Bears for evaluation of his a-fib. He was on amiodarone, but we discontinued it due to corneal deposits. He has maintained sinus rhythm without recurrent atrial fibrillation. PVCs were noted and noted to be significant and he was started on mexiletine. Since that he's had some improvement in his symptoms although was noted to have some PVCs today. He has a follow-up with Dr. Curt Bears in a few months. We repeated an echocardiogram which showed normal systolic and diastolic function with mild left atrial enlargement.  08/10/2016  Carl Harper returns today. He reports his palpitations have improved significantly with the addition of mexiletine. He is seen Dr. Curt Bears with cardiac electrophysiology for this. Blood pressure is well-controlled today 102/52. EKG which is personally reviewed shows sinus bradycardia with marked sinus arrhythmia and first-degree AV block at 53. QTC is 377 ms with a left anterior fascicular block and first-degree AV block.   02/05/2017  Carl Harper was seen today in follow-up.  Overall is without complaints.  He denies any significant palpitations, shortness of breath, chest pain or lower extremity claudication.  He is been successfully treated on mexiletine and although he continues to have some PVCs, the frequency and burden have decreased significantly.  Blood pressure is at goal.  EKG was personally reviewed  today which is sinus rhythm, first-degree AV block and occasional PVCs, left anterior fascicular block and QTC 413 ms.  09/11/2017  Carl Harper returns today for follow-up.  Again he is doing well without complaints.  He denies any palpitations or significant arrhythmias.  He denies any chest pain or worsening shortness of breath.  He occasionally gets some fatigue with working out in the hot weather for long periods of time.  This may be related to some low normal blood pressure.  He is using Lasix as needed.  Blood pressure today was 108/58 however he generally says that his blood pressures around 914 systolic.  11/28/2017  Carl Harper is seen today in follow-up.  He seems to be doing well.  He denies any recurrent atrial fibrillation.  He is tolerating Eliquis without bleeding issues.  Blood pressures well controlled today 121/60.  He denies any PVCs and he is compliant with mexiletine and metoprolol.  EKG shows sinus bradycardia with first-degree AV block, nonspecific IVCD with QTC of 411 ms.  His only other concern is he has some right leg pain.  He does have a history of PAD.  He says sometimes this is worse with exertion and improves at rest but at other  times he gets a pain in his right hip when laying in bed, which is more concerning for a bursitis or possibly osteoarthritis.  09/02/2018  Carl Harper is seen today for follow-up.  He he had to virtual visit since I last saw him in the office.  He had some chest pain and underwent a CT of the chest because of a dilated aorta.  This showed aortic ectasia measuring between 4.8 and 5 cm.  The previous 5 cm measurement was in 2001 however his most recent CT measurement was less significant.  There was no evidence of dissection.  I had increased his Imdur however he reduced it to 30 mg daily.  He notes that he is only taking sublingual nitro about once a week.  In general he is fairly comfortable.  His EKG incidentally did show atrial fibrillation  today which is rate controlled.  He is on mexiletine and Eliquis.  01/06/2019  Carl Harper returns for follow-up.  He is complaining of some fatigue and weakness.  He just had a repeat CT which showed stable aortic ectasia measuring 4.8 cm.  He had a 5 cm aorta noted in 2001 as well.  I suspect this will continue to be stable.  He does report some fatigue and leg weakness.  Blood pressure was noted to be low today.  He is also an bradycardic response with his A. Fib.  12/12/2019  Carl Harper is seen today for follow-up.  I did lower extremity arterial Dopplers to follow-up on leg weakness however this showed no evidence of arterial insufficiency with a patent.  Abdominal aortic imaging showed no evidence of aneurysm.  He does have a thoracic aneurysm which is stable by CT measuring between 4.8 and 4.9 cm.  We will continue to follow that with annual CTs.  He is to follow-up with his PCP regarding fatigue.  Blood pressure was low today at 96/50 however his wife reported that was unusual.  He is on very little medication to lower blood pressure.  He seems to be tolerating mexiletine and is in the sinus rhythm today with PACs.  05/06/2020  Carl Harper is seen in follow-up today.  He called the office and noted that he has had worsening lower extremity swelling.  He was recently cardioverted for atrial fibrillation.  He has maintained sinus rhythm and saw Roderic Palau, NP in the A. fib clinic.  He was given an increased dose of Lasix for 3 days without much improvement.  His wife increase his Lasix up to 40mg  daily however his swelling is only minimally improved.  He also reports persistent fatigue and some shortness of breath.  In general it sounds like he is fairly sedentary according to his wife he says that he sleeps most of the morning and is back in bed with his feet elevated in the afternoon.  PMHx:  Past Medical History:  Diagnosis Date  . Benign neoplasm of colon 10/24/2006  . BPH (benign  prostatic hyperplasia)   . Daily headache   . Diverticulosis 09/03.2008  . Dysrhythmia    "irregular"  . Hypertension   . Internal hemorrhoids without mention of complication 32/95/1884  . Memory loss   . PAD (peripheral artery disease) ([place])   . Restless leg syndrome   . Sleep apnea    dx'd; wore mask; lost weight; turned in machine"    Past Surgical History:  Procedure Laterality Date  . APPENDECTOMY  ~ 1954  . ATHERECTOMY Right 11/07/2011   Procedure:  ATHERECTOMY;  Surgeon: Lorretta Harp, MD;  Location: [facility] CATH LAB;  Service: Cardiovascular;  Laterality: Right;  . CARDIAC CATHETERIZATION  03/30/1999   non-critical CAD  . CARDIOVERSION N/A 04/19/2020   Procedure: CARDIOVERSION;  Surgeon: Fay Records, MD;  Location: [facility];  Service: Cardiovascular;  Laterality: N/A;  . CATARACT EXTRACTION W/ INTRAOCULAR LENS  IMPLANT, BILATERAL  2011-2012  . ILIAC ARTERY STENT Right 11/07/2011   Diamondback orbital rotational atherectomy, PTA & stenting of calcified R CIA (Dr. Adora Fridge)  . LOWER EXTREMITY ANGIOGRAM N/A 11/07/2011   Procedure: LOWER EXTREMITY ANGIOGRAM;  Surgeon: Lorretta Harp, MD;  Location: [facility] CATH LAB;  Service: Cardiovascular;  Laterality: N/A;  . NM MYOCAR PERF WALL MOTION  09/2012   lexiscan myoview - low risk with fixed inferior defect with underlying bowel attenuation suggestive of artifact  . PERCUTANEOUS STENT INTERVENTION  11/07/2011   Procedure: PERCUTANEOUS STENT INTERVENTION;  Surgeon: Lorretta Harp, MD;  Location: [facility] CATH LAB;  Service: Cardiovascular;;  . SHOULDER ARTHROSCOPY W/ ROTATOR CUFF REPAIR Left 2010  . Sleep Study  01/09/2011   AHI during total sleep 14.4/hr and during REM 19.4/hr  . TRANSTHORACIC ECHOCARDIOGRAM  09/2012   EF 55-60%, LA mildly dilated    FAMHx:  Family History  Problem Relation Age of Onset  . Heart Problems Father 10  . Cancer Brother 94  . Heart failure Brother   . Cancer Maternal Grandfather        prostate     SOCHx:   reports that he quit smoking about 25 years ago. His smoking use included cigarettes. He has a 55.00 pack-year smoking history. He has never used smokeless tobacco. He reports current alcohol use of about 14.0 standard drinks of alcohol per week. He reports that he does not use drugs.  ALLERGIES:  Allergies  Allergen Reactions  . Amiodarone Other (See Comments)    Other reaction(s): Other (See Comments) Corneal deposits Corneal deposits  . Bee Venom Other (See Comments)    unknown    ROS: Pertinent items noted in HPI and remainder of comprehensive ROS otherwise negative.  HOME MEDS: Current Outpatient Medications  Medication Sig Dispense Refill  . alendronate (FOSAMAX) 70 MG tablet Take 70 mg by mouth every Sunday.    Marland Kitchen aspirin 81 MG tablet Take 81 mg by mouth daily.    . Calcium Citrate 250 MG TABS Take 1-2 tablets by mouth See admin instructions. 2 tabs twice daily, 1 tab at noon    . Cholecalciferol (VITAMIN D3) 50 MCG (2000 UT) CAPS Take 2,000 Units by mouth daily.    Marland Kitchen ELIQUIS 5 MG TABS tablet TAKE ONE TABLET BY MOUTH TWICE A DAY 60 tablet 5  . finasteride (PROSCAR) 5 MG tablet Take 5 mg by mouth daily.    . isosorbide mononitrate (IMDUR) 30 MG 24 hr tablet Take 15 mg by mouth daily.    Marland Kitchen losartan (COZAAR) 25 MG tablet TAKE ONE TABLET BY MOUTH DAILY 90 tablet 2  . LUTEIN PO Take 20 mg by mouth daily.    . metoprolol succinate (TOPROL-XL) 25 MG 24 hr tablet TAKE 1/2 TABLET BY MOUTH DAILY 45 tablet 2  . mexiletine (MEXITIL) 250 MG capsule Take 1 capsule (250 mg total) by mouth 2 (two) times daily. 180 capsule 2  . Multiple Vitamin (MULTIVITAMIN WITH MINERALS) TABS tablet Take 1 tablet by mouth daily.    . Multiple Vitamins-Minerals (PRESERVISION AREDS) CAPS Take 1 capsule by mouth in the morning and  at bedtime.    . nitroGLYCERIN (NITROSTAT) 0.4 MG SL tablet DISSOLVE 1 TAB UNDER TONGUE FOR CHEST PAIN - IF PAIN REMAINS AFTER 5 MIN, CALL 911 AND REPEAT DOSE. MAX 3  TABS IN 15 MINUTES 25 tablet 4  . pramipexole (MIRAPEX) 0.75 MG tablet Take 0.75 mg by mouth daily.    . RESTASIS 0.05 % ophthalmic emulsion Apply 1 drop to eye 2 (two) times daily.    . solifenacin (VESICARE) 5 MG tablet Take 5 mg by mouth daily.    . Tamsulosin HCl (FLOMAX) 0.4 MG CAPS Take 0.4 mg by mouth in the morning and at bedtime.    . traZODone (DESYREL) 100 MG tablet Take 100 mg by mouth at bedtime.  3  . furosemide (LASIX) 20 MG tablet Take 2 tablets (40 mg total) by mouth 2 (two) times daily. 120 tablet 11   No current facility-administered medications for this visit.    LABS/IMAGING: No results found for this or any previous visit (from the past 48 hour(s)). No results found.  VITALS: BP (!) 95/43   Pulse (!) 45   Ht 5\' 10"  (1.778 m)   Wt 178 lb (80.7 kg)   SpO2 99%   BMI 25.54 kg/m   EXAM: General appearance: alert and no distress Neck: no carotid bruit, no JVD and thyroid not enlarged, symmetric, no tenderness/mass/nodules Lungs: clear to auscultation bilaterally Heart: irregularly irregular rhythm Abdomen: soft, non-tender; bowel sounds normal; no masses,  no organomegaly Extremities: edema Trace ankle edema bilaterally Pulses: 2+ and symmetric Skin: Skin color, texture, turgor normal. No rashes or lesions Neurologic: Grossly normal Psych: Pleasant  EKG: Sinus bradycardia first-degree AV block at 45-personally reviewed  ASSESSMENT: 1. Fatigue/leg swelling 2. Paroxysmal afib/flutter with frequent PVC's - CHADSVASC score of 4 on Eliquis- amiodarone discontinued due to corneal deposits 3. PVC's -suppressed with mexilitine 4. PAD status post diamondback were orbital atherectomy and stenting of the right common iliac artery 5. Hypertension 6. Dyslipidemia 7. Mild CAD 8. Obstructive sleep apnea- not on CPAP 9. DOE - negative treadmill stress test 10. Tremor/memory loss/gait abnormalities - no evidence of Parkinson's by recent neurology consult 11. Ascending  aortic aneurysm measuring 4.8 cm (5.0 cm measurement in 2001)  PLAN: 1.   Carl Harper had recent A. fib but has been cardioverted and is maintaining sinus rhythm.  He has had worsening leg edema.  I wonder if this recent episode of A. fib could have led to some diastolic congestive heart failure.  His short-term increase in Lasix has not been helpful.  I advised increasing his Lasix further to 40 mg twice daily.  He should take it in the morning and afternoon.  We will check blood work including comprehensive metabolic profile and BNP in about 2 weeks.  We will also get a new echocardiogram to see if he has had any worsening systolic dysfunction.  He does have an upcoming appointment with Dr. Curt Bears and may need to revisit ablation if there is evidence for heart failure.  Finally, he will have a repeat imaging of his ascending aortic aneurysm in May and I will follow-up with him likely afterwards.  Pixie Casino, MD, [facility], [place] Director of the Advanced Lipid Disorders &  Cardiovascular Risk Reduction Clinic Attending Cardiologist  Direct Dial: [phone]  Fax: [phone]  Website:  [email or web address] 05/06/2020, 4:43 PM

## 2020-05-06 NOTE — Patient Instructions (Signed)
Medication Instructions:  INCREASE lasix to 40mg  twice daily  *If you need a refill on your cardiac medications before your next appointment, please call your pharmacy*   Lab Work: BNP, CMET in 2 weeks  If you have labs (blood work) drawn today and your tests are completely normal, you will receive your results only by: Marland Kitchen MyChart Message (if you have MyChart) OR . A paper copy in the mail If you have any lab test that is abnormal or we need to change your treatment, we will call you to review the results.   Testing/Procedures: Your physician has requested that you have an echocardiogram. Echocardiography is a painless test that uses sound waves to create images of your heart. It provides your doctor with information about the size and shape of your heart and how well your heart's chambers and valves are working. This procedure takes approximately one hour. There are no restrictions for this procedure. -- [address]   Follow-Up: At [facility], you and your health needs are our priority.  As part of our continuing mission to provide you with exceptional heart care, we have created designated Provider Care Teams.  These Care Teams include your primary Cardiologist (physician) and Advanced Practice Providers (APPs -  Physician Assistants and Nurse Practitioners) who all work together to provide you with the care you need, when you need it.  We recommend signing up for the patient portal called "MyChart".  Sign up information is provided on this After Visit Summary.  MyChart is used to connect with patients for Virtual Visits (Telemedicine).  Patients are able to view lab/test results, encounter notes, upcoming appointments, etc.  Non-urgent messages can be sent to your provider as well.   To learn more about what you can do with MyChart, go to [email or web address].    Your next appointment:   May 2022 -- after CT test  The format for your next appointment:   In  Person  Provider:   You may see Pixie Casino, MD or one of the following Advanced Practice Providers on your designated Care Team:    Almyra Deforest, PA-C  Fabian Sharp, PA-C or   Roby Lofts, [place]    Other Instructions

## 2020-05-27 ENCOUNTER — Encounter: Payer: Self-pay | Admitting: Cardiology

## 2020-05-27 ENCOUNTER — Ambulatory Visit: Payer: Medicare HMO | Admitting: Cardiology

## 2020-05-27 ENCOUNTER — Other Ambulatory Visit: Payer: Self-pay

## 2020-05-27 VITALS — BP 122/60 | HR 77 | Ht 70.0 in | Wt 181.8 lb

## 2020-05-27 DIAGNOSIS — I4892 Unspecified atrial flutter: Secondary | ICD-10-CM | POA: Diagnosis not present

## 2020-05-27 DIAGNOSIS — I2510 Atherosclerotic heart disease of native coronary artery without angina pectoris: Secondary | ICD-10-CM

## 2020-05-27 DIAGNOSIS — I4819 Other persistent atrial fibrillation: Secondary | ICD-10-CM | POA: Diagnosis not present

## 2020-05-27 DIAGNOSIS — I493 Ventricular premature depolarization: Secondary | ICD-10-CM | POA: Diagnosis not present

## 2020-05-27 NOTE — Progress Notes (Signed)
Electrophysiology Office Note   Date:  05/27/2020   ID:  Carl Harper, DOB Jun 16, 1934, MRN [id number]  PCP:  Bernerd Limbo, MD  Cardiologist:  Lyman Bishop Primary Electrophysiologist:  Constance Haw, MD    No chief complaint on file.    History of Present Illness: Carl Harper is a 86 y.o. male who presents today for electrophysiology evaluation.  He has a history significant for peripheral arterial disease.  He underwent rotational atherectomy by Dr. Alvester Chou in 2013 with stenting of a calcified right common iliac artery stenosis.  He has mild nonobstructive coronary artery disease by cath in 2001 and a negative Myoview in 2011.  He has obstructive sleep apnea on CPAP.  He underwent stress testing which showed PVCs.  He was initially put on amiodarone but had corneal deposits.  He has been switched to mexiletine.  He also has atrial fibrillation and is on Eliquis.  He is status post cardioversion 04/19/2020.  Today, denies symptoms of palpitations, chest pain, shortness of breath, orthopnea, PND, lower extremity edema, claudication, dizziness, presyncope, syncope, bleeding, or neurologic sequela. The patient is tolerating medications without difficulties.  Currently he has episodes of fatigue.  He is not short of breath.  His wife states that he falls asleep when he sits down for long periods of time.  He has stopped driving due to this.  He has not noted any further episodes of atrial fibrillation.   Past Medical History:  Diagnosis Date  . Benign neoplasm of colon 10/24/2006  . BPH (benign prostatic hyperplasia)   . Daily headache   . Diverticulosis 09/03.2008  . Dysrhythmia    "irregular"  . Hypertension   . Internal hemorrhoids without mention of complication 52/77/8242  . Memory loss   . PAD (peripheral artery disease) ([place])   . Restless leg syndrome   . Sleep apnea    dx'd; wore mask; lost weight; turned in machine"   Past Surgical History:  Procedure  Laterality Date  . APPENDECTOMY  ~ 1954  . ATHERECTOMY Right 11/07/2011   Procedure: ATHERECTOMY;  Surgeon: Lorretta Harp, MD;  Location: [facility] CATH LAB;  Service: Cardiovascular;  Laterality: Right;  . CARDIAC CATHETERIZATION  03/30/1999   non-critical CAD  . CARDIOVERSION N/A 04/19/2020   Procedure: CARDIOVERSION;  Surgeon: Fay Records, MD;  Location: [place];  Service: Cardiovascular;  Laterality: N/A;  . CATARACT EXTRACTION W/ INTRAOCULAR LENS  IMPLANT, BILATERAL  2011-2012  . ILIAC ARTERY STENT Right 11/07/2011   Diamondback orbital rotational atherectomy, PTA & stenting of calcified R CIA (Dr. Adora Fridge)  . LOWER EXTREMITY ANGIOGRAM N/A 11/07/2011   Procedure: LOWER EXTREMITY ANGIOGRAM;  Surgeon: Lorretta Harp, MD;  Location: [facility] CATH LAB;  Service: Cardiovascular;  Laterality: N/A;  . NM MYOCAR PERF WALL MOTION  09/2012   lexiscan myoview - low risk with fixed inferior defect with underlying bowel attenuation suggestive of artifact  . PERCUTANEOUS STENT INTERVENTION  11/07/2011   Procedure: PERCUTANEOUS STENT INTERVENTION;  Surgeon: Lorretta Harp, MD;  Location: [facility] CATH LAB;  Service: Cardiovascular;;  . SHOULDER ARTHROSCOPY W/ ROTATOR CUFF REPAIR Left 2010  . Sleep Study  01/09/2011   AHI during total sleep 14.4/hr and during REM 19.4/hr  . TRANSTHORACIC ECHOCARDIOGRAM  09/2012   EF 55-60%, LA mildly dilated     Current Outpatient Medications  Medication Sig Dispense Refill  . alendronate (FOSAMAX) 70 MG tablet Take 70 mg by mouth every Sunday.    Marland Kitchen aspirin 81  MG tablet Take 81 mg by mouth daily.    . Calcium Citrate 250 MG TABS Take 1-2 tablets by mouth See admin instructions. 2 tabs twice daily, 1 tab at noon    . Cholecalciferol (VITAMIN D3) 50 MCG (2000 UT) CAPS Take 2,000 Units by mouth daily.    . ciprofloxacin (CIPRO) 250 MG tablet Take 250 mg by mouth 2 (two) times daily. Take all until complete May 29 2020    . ELIQUIS 5 MG TABS tablet TAKE ONE TABLET BY MOUTH  TWICE A DAY 60 tablet 5  . finasteride (PROSCAR) 5 MG tablet Take 5 mg by mouth daily.    . furosemide (LASIX) 20 MG tablet Take 2 tablets (40 mg total) by mouth 2 (two) times daily. 120 tablet 11  . isosorbide mononitrate (IMDUR) 30 MG 24 hr tablet Take 15 mg by mouth daily.    Marland Kitchen losartan (COZAAR) 25 MG tablet TAKE ONE TABLET BY MOUTH DAILY 90 tablet 2  . LUTEIN PO Take 20 mg by mouth daily.    . metoprolol succinate (TOPROL-XL) 25 MG 24 hr tablet TAKE 1/2 TABLET BY MOUTH DAILY 45 tablet 2  . mexiletine (MEXITIL) 250 MG capsule Take 1 capsule (250 mg total) by mouth 2 (two) times daily. 180 capsule 2  . Multiple Vitamin (MULTIVITAMIN WITH MINERALS) TABS tablet Take 1 tablet by mouth daily.    . Multiple Vitamins-Minerals (PRESERVISION AREDS) CAPS Take 1 capsule by mouth in the morning and at bedtime.    . nitroGLYCERIN (NITROSTAT) 0.4 MG SL tablet DISSOLVE 1 TAB UNDER TONGUE FOR CHEST PAIN - IF PAIN REMAINS AFTER 5 MIN, CALL 911 AND REPEAT DOSE. MAX 3 TABS IN 15 MINUTES 25 tablet 4  . pramipexole (MIRAPEX) 0.75 MG tablet Take 0.75 mg by mouth daily.    . RESTASIS 0.05 % ophthalmic emulsion Apply 1 drop to eye 2 (two) times daily.    . solifenacin (VESICARE) 5 MG tablet Take 5 mg by mouth daily.    . Tamsulosin HCl (FLOMAX) 0.4 MG CAPS Take 0.4 mg by mouth in the morning and at bedtime.    . traZODone (DESYREL) 100 MG tablet Take 100 mg by mouth at bedtime.  3   No current facility-administered medications for this visit.    Allergies:   Amiodarone and Bee venom   Social History:  The patient  reports that he quit smoking about 25 years ago. His smoking use included cigarettes. He has a 55.00 pack-year smoking history. He has never used smokeless tobacco. He reports current alcohol use of about 14.0 standard drinks of alcohol per week. He reports that he does not use drugs.   Family History:  The patient's family history includes Cancer in his maternal grandfather; Cancer (age of onset: 23)  in his brother; Heart Problems (age of onset: 42) in his father; Heart failure in his brother.   ROS:  Please see the history of present illness.   Otherwise, review of systems is positive for none.   All other systems are reviewed and negative.   PHYSICAL EXAM: VS:  BP 122/60   Pulse 77   Ht 5\' 10"  (1.778 m)   Wt 181 lb 12.8 oz (82.5 kg)   SpO2 98%   BMI 26.09 kg/m  , BMI Body mass index is 26.09 kg/m. GEN: Well nourished, well developed, in no acute distress  HEENT: normal  Neck: no JVD, carotid bruits, or masses Cardiac: RRR; no murmurs, rubs, or gallops,no edema  Respiratory:  clear to auscultation bilaterally, normal work of breathing GI: soft, nontender, nondistended, + BS MS: no deformity or atrophy  Skin: warm and dry Neuro:  Strength and sensation are intact Psych: euthymic mood, full affect  EKG:  EKG is not ordered today. Personal review of the ekg ordered 05/06/20 shows sinus rhythm, rate 45  Recent Labs: 04/14/2020: BUN 29; Creatinine, Ser 1.33; Hemoglobin 13.4; Magnesium 2.1; Platelets 255; Potassium 4.6; Sodium 141; TSH 1.693    Lipid Panel     Component Value Date/Time   CHOL 189 04/11/2013 1058   TRIG 59 04/11/2013 1058   HDL 77 04/11/2013 1058   LDLCALC 100 (H) 04/11/2013 1058     Wt Readings from Last 3 Encounters:  05/27/20 181 lb 12.8 oz (82.5 kg)  05/06/20 178 lb (80.7 kg)  04/28/20 178 lb 9.6 oz (81 kg)      Other studies Reviewed: Additional studies/ records that were reviewed today include: holter personally reviewed Review of the above records today demonstrates:  PAF, SVT, short runs of VT and ventricular bigeminy. 2.2 second pause, lowest HR of 35.  32% PVCs  3/16 MPI:  No perfusion defects to suggest ischemia. EF 71%.  TTE 11/24/15 - Left ventricle: The cavity size was normal. Wall thickness was   normal. Systolic function was normal. The estimated ejection   fraction was in the range of 55% to 60%. Wall motion was normal;   there  were no regional wall motion abnormalities. Left   ventricular diastolic function parameters were normal. - Left atrium: The atrium was mildly dilated.  Holter Minimum HR: 34 BPM at 8:30:44 AM Maximum HR: 109 BPM at 8:49:36 PM Average HR: 66 BPM Sinus rhythm 17.4% PVCs 1.7% APCs Occasional atrial and ventricular bigeminy  ASSESSMENT AND PLAN:  1.  Persistent atrial fibrillation: Currently on Eliquis.  CHA2DS2-VASc of 5.  Taken off of amiodarone due to coronary deposits.  Had a cardioversion 02/16/2021.  2.  PVCs: High burden based on cardiac monitor.  Currently on mexiletine with a reduction in burden.  Continue with current management.  High risk medication monitoring performed.  He is having some fatigue.  He is unclear as to the cause of his level of fatigue.  Based on auscultation, it does not sound like he is in atrial fibrillation.  He Nneka Blanda hold his mexiletine for the next 3 days.  If this does not make a difference, he Lurie Mullane restart.  If he does feel improved, he Tricia Pledger call us for further recommendations.  3.  Coronary artery disease: No current chest pain.  Current medicines are reviewed at length with the patient today.   The patient does not have concerns regarding his medicines.  The following changes were made today: None  Labs/ tests ordered today include:  No orders of the defined types were placed in this encounter.    Disposition:   FU with Deniesha Stenglein 6 months  Signed, Aylah Yeary Meredith Leeds, MD  05/27/2020 3:26 PM     [facility] [address] [place] [address] [phone] (office) [phone] (fax)

## 2020-05-27 NOTE — Patient Instructions (Addendum)
Medication Instructions:  Hold Mexiletine over the weekend if feeling better call office and let us know. Your physician recommends that you continue on your current medications as directed. Please refer to the Current Medication list given to you today.  Labwork: None ordered.  Testing/Procedures: None ordered.  Follow-Up: Your physician wants you to follow-up in: 11/18/20 at 2:15 pm with Allegra Lai, MD    Any Other Special Instructions Will Be Listed Below (If Applicable).  If you need a refill on your cardiac medications before your next appointment, please call your pharmacy.

## 2020-06-01 ENCOUNTER — Ambulatory Visit (HOSPITAL_COMMUNITY): Payer: Medicare HMO | Attending: Internal Medicine

## 2020-06-01 ENCOUNTER — Other Ambulatory Visit: Payer: Self-pay

## 2020-06-01 DIAGNOSIS — R0602 Shortness of breath: Secondary | ICD-10-CM | POA: Diagnosis present

## 2020-06-01 DIAGNOSIS — R600 Localized edema: Secondary | ICD-10-CM

## 2020-06-01 LAB — ECHOCARDIOGRAM COMPLETE
Area-P 1/2: 1.69 cm2
S' Lateral: 3.1 cm

## 2020-07-12 NOTE — Progress Notes (Signed)
Cardiology Office Note:    Date:  07/14/2020   ID:  Carl Harper, DOB October 23, 1934, MRN [id number]  PCP:  Carl Limbo, MD  Cardiologist:  Carl Casino, MD   Referring MD: Carl Limbo, MD   Chief Complaint  Patient presents with  . Follow-up  thoracic aorta aneurysm   History of Present Illness:    Carl Harper is a 86 y.o. male with a hx of PAD, CAD, OSA on CPAP, Afib on eliquis, and PVCs. Dr. Gwenlyn Harper performed orbital atherectomy and stenting of right common iliac in 2013. Mild nonobstructive CAD by heart cath in 2001 and negative myoview in 2011. OSA on CPAP as well as GERD with atypical CP on occasion. He was diagnosed with atrial flutter (before 2017) and was started on eliquis. He has also had symptomatic PVCs and placed on amiodarone. Unfortunately, this was stopped by his eye doctor for concern for corneal deposits. Dr. Curt Harper started mexiletine for PVCs. Repeat echo showed normal EF and mild left atrial enlargement (2017). In follow up, he had sinus bradycardia with 1st degree heart block and IVCD (LAFB also noted on prior tracings). Dr. Debara Harper has also been following an ascending aorta aneurysm, was 5 cm in 2001.  LE dopplers for leg weakness were negative for arterial insuffiencey. CTA 2021 showed stable, if not slightly smaller, thoracic aortic aneurysm.   He was seen by Dr. Debara Harper 05/06/20 for lower extremity swelling. He was recently DCCV for Afib and maintained sinus rhythm. Dr. Debara Harper increased lasix to 40 mg BID. Echo showed normal EF, no RWMA, grade 1 DD, mild to moderate aortic valve sclerosis, and ascending aorta 5.0 cm.   He returns for follow up. He had surveillance CTA yesterday that showed thoracic aorta dilation is 4.8 cm, previously 4.6 cm. Appears stable. No new symptoms. BP is very well controlled. He continues to fall - unclear etiology. No dizziness, vertigo, or palpitations prior to falls. He takes one nitro a week for chest pain - this is stable.  unfortunately BP too low to increase imdur. Recent echo and stress test reassuring. Upon further questioning, I don't suspect a cardiac etiology for his occasional chest pain. He will contact us if it increasing in quality or quantity, discussed ER precautions. He is not taking lasix as prescribed due to fatigue, possibly too many bathroom trips vs hypotension. I have asked him to change imdur to evening dosing and increase lasix from 20 mg daily to 40 mg every morning and at least 20 mg at lunch x 3 days. He is avoiding salt. He is sitting with feet down all day. He tried compression socks last year and states they didn't work and doesn't want to try again. EKG with sinus bradycardia.   Past Medical History:  Diagnosis Date  . Benign neoplasm of colon 10/24/2006  . BPH (benign prostatic hyperplasia)   . Daily headache   . Diverticulosis 09/03.2008  . Dysrhythmia    "irregular"  . Hypertension   . Internal hemorrhoids without mention of complication 78/46/9629  . Memory loss   . PAD (peripheral artery disease) ([place])   . Restless leg syndrome   . Sleep apnea    dx'd; wore mask; lost weight; turned in machine"    Past Surgical History:  Procedure Laterality Date  . APPENDECTOMY  ~ 1954  . ATHERECTOMY Right 11/07/2011   Procedure: ATHERECTOMY;  Surgeon: Lorretta Harp, MD;  Location[facility] CATH LAB;  Service: Cardiovascular;  Laterality: Right;  . CARDIAC  CATHETERIZATION  03/30/1999   non-critical CAD  . CARDIOVERSION N/A 04/19/2020   Procedure: CARDIOVERSION;  Surgeon: Pricilla Riffleoss, Paula V, MD;  [facility];  Service: Cardiovascular;  Laterality: N/A;  . CATARACT EXTRACTION W/ INTRAOCULAR LENS  IMPLANT, BILATERAL  2011-2012  . ILIAC ARTERY STENT Right 11/07/2011   Diamondback orbital rotational atherectomy, PTA & stenting of calcified R CIA (Dr. Erlene QuanJ. Harper)  . LOWER EXTREMITY ANGIOGRAM N/A 11/07/2011   Procedure: LOWER EXTREMITY ANGIOGRAM;  Surgeon: Carl GessJonathan J Berry, MD;  [facility];  Service: Cardiovascular;  Laterality: N/A;  . NM MYOCAR PERF WALL MOTION  09/2012   lexiscan myoview - low risk with fixed inferior defect with underlying bowel attenuation suggestive of artifact  . PERCUTANEOUS STENT INTERVENTION  11/07/2011   Procedure: PERCUTANEOUS STENT INTERVENTION;  Surgeon: Carl GessJonathan J Berry, MD;  [facility];  Service: Cardiovascular;;  . SHOULDER ARTHROSCOPY W/ ROTATOR CUFF REPAIR Left 2010  . Sleep Study  01/09/2011   AHI during total sleep 14.4/hr and during REM 19.4/hr  . TRANSTHORACIC ECHOCARDIOGRAM  09/2012   EF 55-60%, LA mildly dilated    Current Medications: Current Meds  Medication Sig  . alendronate (FOSAMAX) 70 MG tablet Take 70 mg by mouth every Sunday.  Marland Kitchen. aspirin 81 MG tablet Take 81 mg by mouth daily.  . Calcium Citrate 250 MG TABS Take 1-2 tablets by mouth See admin instructions. 2 tabs twice daily, 1 tab at noon  . Cholecalciferol (VITAMIN D3) 50 MCG (2000 UT) CAPS Take 2,000 Units by mouth daily.  . ciprofloxacin (CIPRO) 250 MG tablet Take 250 mg by mouth 2 (two) times daily. Take all until complete May 29 2020  . ELIQUIS 5 MG TABS tablet TAKE ONE TABLET BY MOUTH TWICE A DAY  . finasteride (PROSCAR) 5 MG tablet Take 5 mg by mouth daily.  . furosemide (LASIX) 20 MG tablet Take 2 tablets (40 mg total) by mouth 2 (two) times daily.  . isosorbide mononitrate (IMDUR) 30 MG 24 hr tablet Take 15 mg by mouth daily.  Marland Kitchen. losartan (COZAAR) 25 MG tablet TAKE ONE TABLET BY MOUTH DAILY  . LUTEIN PO Take 20 mg by mouth daily.  . metoprolol succinate (TOPROL-XL) 25 MG 24 hr tablet TAKE 1/2 TABLET BY MOUTH DAILY  . mexiletine (MEXITIL) 250 MG capsule Take 1 capsule (250 mg total) by mouth 2 (two) times daily.  . Multiple Vitamin (MULTIVITAMIN WITH MINERALS) TABS tablet Take 1 tablet by mouth daily.  . Multiple Vitamins-Minerals (PRESERVISION AREDS) CAPS Take 1 capsule by mouth in the morning and at bedtime.  . nitroGLYCERIN (NITROSTAT) 0.4 MG SL  tablet DISSOLVE 1 TAB UNDER TONGUE FOR CHEST PAIN - IF PAIN REMAINS AFTER 5 MIN, CALL 911 AND REPEAT DOSE. MAX 3 TABS IN 15 MINUTES  . pramipexole (MIRAPEX) 0.75 MG tablet Take 0.75 mg by mouth daily.  . solifenacin (VESICARE) 5 MG tablet Take 5 mg by mouth daily.  . Tamsulosin HCl (FLOMAX) 0.4 MG CAPS Take 0.4 mg by mouth in the morning and at bedtime.  . traZODone (DESYREL) 100 MG tablet Take 100 mg by mouth at bedtime.  Marland Kitchen. XIIDRA 5 % SOLN Place 5 % into both eyes 2 (two) times daily.  . [DISCONTINUED] RESTASIS 0.05 % ophthalmic emulsion Apply 1 drop to eye 2 (two) times daily.     Allergies:   Amiodarone and Bee venom   Social History   Socioeconomic History  . Marital status: Married    Spouse name:  Not on file  . Number of children: 0  . Years of education: 49  . Highest education level: Not on file  Occupational History  . Occupation: retired    Fish farm manager: [employer]  Tobacco Use  . Smoking status: Former Smoker    Packs/day: 1.00    Years: 55.00    Pack years: 55.00    Types: Cigarettes    Quit date: 06/24/1994    Years since quitting: 26.0  . Smokeless tobacco: Never Used  Vaping Use  . Vaping Use: Never used  Substance and Sexual Activity  . Alcohol use: Yes    Alcohol/week: 14.0 standard drinks    Types: 14 Glasses of wine per week    Comment: 11/07/2011 "couple drinks of wine q night"  . Drug use: No  . Sexual activity: Not Currently  Other Topics Concern  . Not on file  Social History Narrative  . Not on file   Social Determinants of Health   Financial Resource Strain: Not on file  Food Insecurity: Not on file  Transportation Needs: Not on file  Physical Activity: Not on file  Stress: Not on file  Social Connections: Not on file     Family History: The patient's family history includes Cancer in his maternal grandfather; Cancer (age of onset: 68) in his brother; Heart Problems (age of onset: 55) in his father; Heart failure in his brother.  ROS:    Please see the history of present illness.     All other systems reviewed and are negative.  EKGs/Labs/Other Studies Reviewed:    The following studies were reviewed today:  Echo 06/01/20: 1. Left ventricular ejection fraction, by estimation, is 60 to 65%. The  left ventricle has normal function. The left ventricle has no regional  wall motion abnormalities. Left ventricular diastolic parameters are  consistent with Grade I diastolic  dysfunction (impaired relaxation).  2. Right ventricular systolic function is normal. The right ventricular  size is normal. There is normal pulmonary artery systolic pressure. The  estimated right ventricular systolic pressure is 71.2 mmHg.  3. Left atrial size was mildly dilated.  4. The mitral valve is grossly normal. Trivial mitral valve  regurgitation.  5. The aortic valve is tricuspid. There is moderate calcification of the  aortic valve. Aortic valve regurgitation is trivial. Mild to moderate  aortic valve sclerosis/calcification is present, without any evidence of  aortic stenosis.  6. Aortic dilatation noted. There is mild dilatation of the aortic root,  measuring 40 mm. There is severe dilatation of the ascending aorta,  measuring 50 mm.  7. The inferior vena cava is dilated in size with >50% respiratory  variability, suggesting right atrial pressure of 8 mmHg.   EKG:  EKG is  ordered today.  The ekg ordered today demonstrates sinus bradycardia HR 43, first degree heart block  Recent Labs: 04/14/2020: BUN 29; Creatinine, Ser 1.33; Hemoglobin 13.4; Magnesium 2.1; Platelets 255; Potassium 4.6; Sodium 141; TSH 1.693  Recent Lipid Panel    Component Value Date/Time   CHOL 189 04/11/2013 1058   TRIG 59 04/11/2013 1058   HDL 77 04/11/2013 1058   LDLCALC 100 (H) 04/11/2013 1058    Physical Exam:    VS:  BP (!) 100/50   Pulse (!) 58   Ht 5\' 10"  (1.778 m)   Wt 181 lb 3.2 oz (82.2 kg)   SpO2 96%   BMI 26.00 kg/m     Wt Readings  from Last 3 Encounters:  07/14/20  181 lb 3.2 oz (82.2 kg)  05/27/20 181 lb 12.8 oz (82.5 kg)  05/06/20 178 lb (80.7 kg)     GEN:  Well nourished, well developed in no acute distress HEENT: Normal NECK: No JVD; No carotid bruits LYMPHATICS: No lymphadenopathy CARDIAC: regular rhythm, bradycardic rate RESPIRATORY:  Clear to auscultation without rales, wheezing or rhonchi  ABDOMEN: Soft, non-tender, non-distended MUSCULOSKELETAL:  3+ B LE edema; No deformity  SKIN: Warm and dry NEUROLOGIC:  Alert and oriented x 3 PSYCHIATRIC:  Normal affect   ASSESSMENT:    1. Ascending aorta dilatation (HCC)   2. Bilateral lower extremity edema   3. Persistent atrial fibrillation ([place])   4. PVC's (premature ventricular contractions)   5. Essential hypertension   6. Coronary artery disease involving native coronary artery of native heart without angina pectoris    PLAN:    In order of problems listed above:  Ascending aortic aneurysm - measuring 4.8 cm, from 4.6 cm (measured 4.9 cm in 05/2019) - I reviewed prior scans, appears stable - will send to Dr. Debara Harper - appears stable - good BP control   Lower extremity swelling - supposed to take 40 mg lasix BID, but he isn't able to tolerate this - "wipes me out" - taking 20 mg in the morning - does have 3+ pitting edema, no dyspnea, orthopnea, or PND - recent echo reassuring - question if he is not able to tolerate lasix due to excessive trips to the bathroom or hypotension - he has tried compression socks and states they don't work - he sits at the computer most of the day and doesn't elevate his feet - I have moved his imdur to evening dosing, and asked him to take 40 mg lasix every morning + extra 20 mg lasix at lunch for the next 3 days - he will also elevate his legs during the day and raise legs in bed at night - I have also asked him to walk a lap around the house every hour   PVCs - on mixelitine   PAF - on eliquis - D/C'ed  amiodarone secondary to corneal deposits   Hypertension - changes as above, well controlled   Mild nonobstructive CAD - no chest pain     Medication Adjustments/Labs and Tests Ordered: Current medicines are reviewed at length with the patient today.  Concerns regarding medicines are outlined above.  No orders of the defined types were placed in this encounter.  No orders of the defined types were placed in this encounter.   Signed, Ledora Bottcher, [place]  07/14/2020 3:14 PM    [facility]

## 2020-07-13 ENCOUNTER — Ambulatory Visit
Admission: RE | Admit: 2020-07-13 | Discharge: 2020-07-13 | Disposition: A | Discharge status: Home / Self Care | Payer: Medicare HMO | Source: Ambulatory Visit | Attending: Physician Assistant | Admitting: Physician Assistant

## 2020-07-13 ENCOUNTER — Other Ambulatory Visit: Payer: Self-pay

## 2020-07-13 DIAGNOSIS — I712 Thoracic aortic aneurysm, without rupture, unspecified: Secondary | ICD-10-CM

## 2020-07-13 MED ORDER — IOPAMIDOL (ISOVUE-370) INJECTION 76%
75.0000 mL | Freq: Once | INTRAVENOUS | Status: AC | PRN
  Administered 2020-07-13: 75 mL via INTRAVENOUS

## 2020-07-14 ENCOUNTER — Other Ambulatory Visit: Payer: Self-pay

## 2020-07-14 ENCOUNTER — Ambulatory Visit: Payer: Medicare HMO | Admitting: Physician Assistant

## 2020-07-14 ENCOUNTER — Encounter: Payer: Self-pay | Admitting: Physician Assistant

## 2020-07-14 VITALS — BP 100/50 | HR 58 | Ht 70.0 in | Wt 181.2 lb

## 2020-07-14 DIAGNOSIS — I10 Essential (primary) hypertension: Secondary | ICD-10-CM

## 2020-07-14 DIAGNOSIS — I4819 Other persistent atrial fibrillation: Secondary | ICD-10-CM | POA: Diagnosis not present

## 2020-07-14 DIAGNOSIS — R600 Localized edema: Secondary | ICD-10-CM

## 2020-07-14 DIAGNOSIS — I77810 Thoracic aortic ectasia: Secondary | ICD-10-CM

## 2020-07-14 DIAGNOSIS — I493 Ventricular premature depolarization: Secondary | ICD-10-CM

## 2020-07-14 DIAGNOSIS — I2510 Atherosclerotic heart disease of native coronary artery without angina pectoris: Secondary | ICD-10-CM

## 2020-07-14 NOTE — Patient Instructions (Signed)
Medication Instructions:  BEGIN TAKING IMDUR AT BEDTIME  TAKE YOUR FUROSEMIDE 40MG  (2TABS) DAILY AND TAKE ADDITIONAL 20MG  AT LUNCHTIME FOR 3 DAYS THEN BACK TO 40MG  DAILY *If you need a refill on your cardiac medications before your next appointment, please call your pharmacy*  Special Instructions MAKE SURE TO WALK EVERY HOUR WHEN AWAKE   Follow-Up: Your next appointment:  4-6 week(s) In Person with K. Mali Hilty, MD OR IF UNAVAILABLE ANGELA DUKE, PA-C   At [facility], you and your health needs are our priority.  As part of our continuing mission to provide you with exceptional heart care, we have created designated Provider Care Teams.  These Care Teams include your primary Cardiologist (physician) and Advanced Practice Providers (APPs -  Physician Assistants and Nurse Practitioners) who all work together to provide you with the care you need, when you need it.

## 2020-07-15 NOTE — Addendum Note (Signed)
Addended by: Waylan Rocher on: 07/15/2020 02:25 PM   Modules accepted: Orders

## 2020-07-21 ENCOUNTER — Other Ambulatory Visit: Payer: Self-pay

## 2020-07-21 DIAGNOSIS — I712 Thoracic aortic aneurysm, without rupture, unspecified: Secondary | ICD-10-CM

## 2020-07-27 ENCOUNTER — Other Ambulatory Visit: Payer: Self-pay | Admitting: *Deleted

## 2020-07-27 MED ORDER — NITROGLYCERIN 0.4 MG SL SUBL: 0.4000 mg | SUBLINGUAL_TABLET | SUBLINGUAL | 11 refills | Status: DC | PRN

## 2020-08-05 ENCOUNTER — Other Ambulatory Visit: Payer: Self-pay

## 2020-08-24 NOTE — Progress Notes (Signed)
Cardiology Clinic Note   Patient Name: Carl Harper Date of Encounter: 08/26/2020  Primary Care Provider:  Bernerd Limbo, MD Primary Cardiologist:  Pixie Casino, MD  Patient Profile    Carl Harper 86 year old male presents the clinic today for follow-up evaluation of his lower extremity swelling.  Past Medical History    Past Medical History:  Diagnosis Date   Benign neoplasm of colon 10/24/2006   BPH (benign prostatic hyperplasia)    Daily headache    Diverticulosis 09/03.2008   Dysrhythmia    "irregular"   Hypertension    Internal hemorrhoids without mention of complication 87/56/4332   Memory loss    PAD (peripheral artery disease) (HCC)    Restless leg syndrome    Sleep apnea    dx'd; wore mask; lost weight; turned in machine"   Past Surgical History:  Procedure Laterality Date   APPENDECTOMY  ~ 1954   ATHERECTOMY Right 11/07/2011   Procedure: ATHERECTOMY;  Surgeon: Lorretta Harp, MD;  Location: [facility] CATH LAB;  Service: Cardiovascular;  Laterality: Right;   CARDIAC CATHETERIZATION  03/30/1999   non-critical CAD   CARDIOVERSION N/A 04/19/2020   Procedure: CARDIOVERSION;  Surgeon: Fay Records, MD;  Location: [facility] ENDOSCOPY;  Service: Cardiovascular;  Laterality: N/A;   CATARACT EXTRACTION W/ INTRAOCULAR LENS  IMPLANT, BILATERAL  2011-2012   ILIAC ARTERY STENT Right 11/07/2011   Diamondback orbital rotational atherectomy, PTA & stenting of calcified R CIA (Dr. Adora Fridge)   [place] N/A 11/07/2011   Procedure: LOWER EXTREMITY ANGIOGRAM;  Surgeon: Lorretta Harp, MD;  Location: [facility] CATH LAB;  Service: Cardiovascular;  Laterality: N/A;   NM MYOCAR PERF WALL MOTION  09/2012   lexiscan myoview - low risk with fixed inferior defect with underlying bowel attenuation suggestive of artifact   PERCUTANEOUS STENT INTERVENTION  11/07/2011   Procedure: PERCUTANEOUS STENT INTERVENTION;  Surgeon: Lorretta Harp, MD;  Location: [facility] CATH LAB;  Service:  Cardiovascular;;   SHOULDER ARTHROSCOPY W/ ROTATOR CUFF REPAIR Left 2010   Sleep Study  01/09/2011   AHI during total sleep 14.4/hr and during REM 19.4/hr   TRANSTHORACIC ECHOCARDIOGRAM  09/2012   EF 55-60%, LA mildly dilated    Allergies  Allergies  Allergen Reactions   Amiodarone Other (See Comments)    Other reaction(s): Other (See Comments) Corneal deposits Corneal deposits   Bee Venom Other (See Comments)    unknown    History of Present Illness    ELIAZER HEMPHILL has a PMH of a sending aortic aneurysm, PVCs, paroxysmal atrial fibrillation on apixaban, essential hypertension, mild nonobstructive CAD, OSA on CPAP, atypical chest pain, and GERD.  He was started on Eliquis after being diagnosed with atrial flutter in 2017.  He was noted to have symptomatic PVCs and was placed on amiodarone.  Unfortunately, his amiodarone was stopped by his eye doctor for concerns about corneal deposits.  Dr. Curt Bears started mexiletine for his PVCs.  His repeat echocardiogram showed normal LV EF with mild left atrial enlargement in 2017.  In follow-up he was noted to have sinus bradycardia with first-degree AV block and IVCD.  Dr. 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  He was seen by Dr. Debara Pickett 05/06/2020 for lower extremity swelling.  He underwent DCCV for atrial fibrillation and maintained sinus rhythm.  His furosemide was increased to 40 mg twice daily.  His echocardiogram showed normal LVEF, no regional wall motion abnormalities,  G1 DD, mild-moderate aortic valve sclerosis and an ascending aortic aneurysm mild 5.0 cm.  He was seen by Fabian Sharp PA-C on 07/14/2020.  During that time it was noted that his CTA showed a thoracic aortic dilation of 4.8 cm which was previously 4.6 cm.  It appears to be stable.  He denied new symptoms.  His blood pressure was well controlled.  He reported falls however, the etiology was unclear.  He denied dizziness, vertigo,  palpitations prior to falls.  He was taking 1 nitroglycerin a week for chest pain.  His chest pain was stable.  It was felt that his occasional chest discomfort was not related to cardiac issues.  He had not been taking his furosemide as prescribed due to fatigue.  He reported frequent bathroom trips and it was unclear if it was also accompanied by hypotension.  His Imdur was changed to evening dosing.  His furosemide was increased to 40 mg in the morning and 20 mg at lunch for 3 days.  He was instructed on avoiding salt and elevating his lower extremities.  His EKG showed sinus bradycardia.  He presents the clinic today for follow-up evaluation states he has much less lower extremity swelling.  He continues to walk daily and do light dumbbell weights.  He does notice occasional chest tightness however, he denies exertional chest discomfort.  He reports that these episodes have been happening for several months and come and go without intervention.  His home weight today was 174 pounds and his weight in the clinic fully clothed was 176 pounds.  We reviewed low-sodium diet, the importance of lower extremity support stockings, and fluid restriction.  He and his wife expressed understanding.  I will give him a weight log, he [place] support stocking sheet, salty 6 diet sheet, and have him follow-up in 4 months.  Today denies chest pain, shortness of breath, increased lower extremity edema, fatigue, palpitations, melena, hematuria, hemoptysis, diaphoresis, weakness, presyncope, syncope, orthopnea, and PND.   Home Medications    Prior to Admission medications   Medication Sig Start Date End Date Taking? Authorizing Provider  alendronate (FOSAMAX) 70 MG tablet Take 70 mg by mouth every Sunday. 04/12/20   [provider]  aspirin 81 MG tablet Take 81 mg by mouth daily.    [provider]  Calcium Citrate 250 MG TABS Take 1-2 tablets by mouth See admin instructions. 2 tabs twice daily, 1 tab  at noon    [provider]  Cholecalciferol (VITAMIN D3) 50 MCG (2000 UT) CAPS Take 2,000 Units by mouth daily.    [provider]  ciprofloxacin (CIPRO) 250 MG tablet Take 250 mg by mouth 2 (two) times daily. Take all until complete May 29 2020    [provider]  ELIQUIS 5 MG TABS tablet TAKE ONE TABLET BY MOUTH TWICE A DAY 01/26/20   Hilty, Nadean Corwin, MD  finasteride (PROSCAR) 5 MG tablet Take 5 mg by mouth daily. 08/09/17   [provider]  furosemide (LASIX) 20 MG tablet Take 2 tablets (40 mg total) by mouth 2 (two) times daily. 05/06/20   Hilty, Nadean Corwin, MD  isosorbide mononitrate (IMDUR) 30 MG 24 hr tablet Take 15 mg by mouth daily.    [provider]  losartan (COZAAR) 25 MG tablet TAKE ONE TABLET BY MOUTH DAILY 03/02/20   Hilty, Nadean Corwin, MD  LUTEIN PO Take 20 mg by mouth daily.    [provider]  metoprolol succinate (TOPROL-XL) 25 MG  24 hr tablet TAKE 1/2 TABLET BY MOUTH DAILY 03/02/20   Hilty, Nadean Corwin, MD  mexiletine (MEXITIL) 250 MG capsule Take 1 capsule (250 mg total) by mouth 2 (two) times daily. 03/30/20   Camnitz, Ocie Doyne, MD  Multiple Vitamin (MULTIVITAMIN WITH MINERALS) TABS tablet Take 1 tablet by mouth daily.    [provider]  Multiple Vitamins-Minerals (PRESERVISION AREDS) CAPS Take 1 capsule by mouth in the morning and at bedtime.    [provider]  nitroGLYCERIN (NITROSTAT) 0.4 MG SL tablet Place 1 tablet (0.4 mg total) under the tongue every 5 (five) minutes as needed for chest pain. 07/27/20   Almyra Deforest, PA  pramipexole (MIRAPEX) 0.75 MG tablet Take 0.75 mg by mouth daily. 02/04/18   [provider]  solifenacin (VESICARE) 5 MG tablet Take 5 mg by mouth daily. 03/09/20   [provider]  Tamsulosin HCl (FLOMAX) 0.4 MG CAPS Take 0.4 mg by mouth in the morning and at bedtime.    [provider]  traZODone (DESYREL) 100 MG tablet Take 100 mg by mouth at bedtime. 12/19/15    [provider]  XIIDRA 5 % SOLN Place 5 % into both eyes 2 (two) times daily. 05/28/20   [provider]    Family History    Family History  Problem Relation Age of Onset   Heart Problems Father 23   Cancer Brother 69   Heart failure Brother    Cancer Maternal Grandfather        prostate   He indicated that his mother is deceased. He indicated that his father is deceased. He indicated that one of his three brothers is deceased. He indicated that his maternal grandmother is deceased. He indicated that his maternal grandfather is deceased. He indicated that his paternal grandmother is deceased. He indicated that his paternal grandfather is deceased.  Social History    Social History   Socioeconomic History   Marital status: Married    Spouse name: Not on file   Number of children: 0   Years of education: 13   Highest education level: Not on file  Occupational History   Occupation: retired    Fish farm manager: [employer]  Tobacco Use   Smoking status: Former    Packs/day: 1.00    Years: 55.00    Pack years: 55.00    Types: Cigarettes    Quit date: 06/24/1994    Years since quitting: 26.1   Smokeless tobacco: Never  Vaping Use   Vaping Use: Never used  Substance and Sexual Activity   Alcohol use: Yes    Alcohol/week: 14.0 standard drinks    Types: 14 Glasses of wine per week    Comment: 11/07/2011 "couple drinks of wine q night"   Drug use: No   Sexual activity: Not Currently  Other Topics Concern   Not on file  Social History Narrative   Not on file   Social Determinants of Health   Financial Resource Strain: Not on file  Food Insecurity: Not on file  Transportation Needs: Not on file  Physical Activity: Not on file  Stress: Not on file  Social Connections: Not on file  Intimate Partner Violence: Not on file     Review of Systems    General:  No chills, fever, night sweats or weight changes.  Cardiovascular:  No chest pain, dyspnea on exertion,  edema, orthopnea, palpitations, paroxysmal nocturnal dyspnea. Dermatological: No rash, lesions/masses Respiratory: No cough, dyspnea Urologic: No hematuria, dysuria Abdominal:  No nausea, vomiting, diarrhea, bright red blood per rectum, melena, or hematemesis Neurologic:  No visual changes, wkns, changes in mental status. All other systems reviewed and are otherwise negative except as noted above.  Physical Exam    VS:  BP (!) 110/56   Pulse (!) 55   Ht 5\' 10"  (1.778 m)   Wt 176 lb 3.2 oz (79.9 kg)   SpO2 99%   BMI 25.28 kg/m  , BMI Body mass index is 25.28 kg/m. GEN: Well nourished, well developed, in no acute distress. HEENT: normal. Neck: Supple, no JVD, carotid bruits, or masses. Cardiac: RRR, no murmurs, rubs, or gallops. No clubbing, cyanosis, bilateral lower extremity ankle edema.  Radials/DP/PT 2+ and equal bilaterally.  Respiratory:  Respirations regular and unlabored, clear to auscultation bilaterally. GI: Soft, nontender, nondistended, BS + x 4. MS: no deformity or atrophy. Skin: warm and dry, no rash. Neuro:  Strength and sensation are intact. Psych: Normal affect.  Accessory Clinical Findings    Recent Labs: 04/14/2020: BUN 29; Creatinine, Ser 1.33; Hemoglobin 13.4; Magnesium 2.1; Platelets 255; Potassium 4.6; Sodium 141; TSH 1.693   Recent Lipid Panel    Component Value Date/Time   CHOL 189 04/11/2013 1058   TRIG 59 04/11/2013 1058   HDL 77 04/11/2013 1058   LDLCALC 100 (H) 04/11/2013 1058    ECG personally reviewed by me today-none today.  Echocardiogram 06/01/2020 IMPRESSIONS     1. Left ventricular ejection fraction, by estimation, is 60 to 65%. The  left ventricle has normal function. The left ventricle has no regional  wall motion abnormalities. Left ventricular diastolic parameters are  consistent with Grade I diastolic  dysfunction (impaired relaxation).   2. Right ventricular systolic function is normal. The right ventricular  size is  normal. There is normal pulmonary artery systolic pressure. The  estimated right ventricular systolic pressure is 61.9 mmHg.   3. Left atrial size was mildly dilated.   4. The mitral valve is grossly normal. Trivial mitral valve  regurgitation.   5. The aortic valve is tricuspid. There is moderate calcification of the  aortic valve. Aortic valve regurgitation is trivial. Mild to moderate  aortic valve sclerosis/calcification is present, without any evidence of  aortic stenosis.   6. Aortic dilatation noted. There is mild dilatation of the aortic root,  measuring 40 mm. There is severe dilatation of the ascending aorta,  measuring 50 mm.   7. The inferior vena cava is dilated in size with >50% respiratory  variability, suggesting right atrial pressure of 8 mmHg.   Comparison(s): No significant change from prior study. 05/09/2018: LVEF  55-60%, aortic valve calcification without stenosis, dilated ascending  aorta to 50 mm.   Assessment & Plan   1.  Lower extremity edema-generalized nonpitting bilateral lower extremity edema.  Reports compliance with furosemide.  Focusing on low-sodium meals. Continue furosemide Heart healthy low-sodium diet-salty 6 given Increase physical activity as tolerated Elevate lower extremities when not active Fluid restriction 48-64 ounces Wear lower extremity support stockings-[place] support stocking sheet given  Essential hypertension-BP today 110/56 well-controlled at home. Continue Imdur, losartan, metoprolol Heart healthy low-sodium diet-salty 6 given Increase physical activity as tolerated  Thoracic aortic aneurysm-on last evaluation it measured 5.0 mm on echocardiogram 06/01/2020 (stable). Repeat echocardiogram 5/23  Paroxysmal atrial fibrillation-heart rate today 55.  No recent episodes of increased heart rate or irregular beats.  Denies bleeding issues.  Reports compliance with apixaban.  Amiodarone previously discontinued due to corneal  deposits. Continue apixaban Heart healthy  low-sodium diet-salty 6 given Increase physical activity as tolerated Avoid triggers caffeine, chocolate, EtOH, dehydration etc.  Coronary artery disease-reports intermittent episodes of nonexertional chest tightness.  Able to complete daily walking and light weight activity without chest discomfort.  Mild nonobstructive CAD Continue aspirin, Imdur, metoprolol Heart healthy low-sodium high-fiber diet Increase physical activity as tolerated  Disposition: Follow-up with Dr. Debara Pickett and 3-4 months.  Jossie Ng. Lissett Favorite NP-C    08/26/2020, 2:08 PM [facility] [place] [address] Office [phone] Fax [phone]  Notice: This dictation was prepared with Dragon dictation along with smaller phrase technology. Any transcriptional errors that result from this process are unintentional and may not be corrected upon review.  I spent 14 minutes examining this patient, reviewing medications, and using patient centered shared decision making involving her cardiac care.  Prior to her visit I spent greater than 20 minutes reviewing her past medical history,  medications, and prior cardiac tests.

## 2020-08-26 ENCOUNTER — Other Ambulatory Visit: Payer: Self-pay

## 2020-08-26 ENCOUNTER — Encounter: Payer: Self-pay | Admitting: General Practice

## 2020-08-26 ENCOUNTER — Ambulatory Visit: Payer: Medicare HMO | Admitting: General Practice

## 2020-08-26 VITALS — BP 110/56 | HR 55 | Ht 70.0 in | Wt 176.2 lb

## 2020-08-26 DIAGNOSIS — I10 Essential (primary) hypertension: Secondary | ICD-10-CM

## 2020-08-26 DIAGNOSIS — I2510 Atherosclerotic heart disease of native coronary artery without angina pectoris: Secondary | ICD-10-CM

## 2020-08-26 DIAGNOSIS — R600 Localized edema: Secondary | ICD-10-CM

## 2020-08-26 DIAGNOSIS — I480 Paroxysmal atrial fibrillation: Secondary | ICD-10-CM

## 2020-08-26 DIAGNOSIS — I712 Thoracic aortic aneurysm, without rupture, unspecified: Secondary | ICD-10-CM

## 2020-08-26 NOTE — Patient Instructions (Signed)
Medication Instructions:  The current medical regimen is effective;  continue present plan and medications as directed. Please refer to the Current Medication list given to you today.  *If you need a refill on your cardiac medications before your next appointment, please call your pharmacy*  Lab Work:   Testing/Procedures:  NONE    NONE  Special Instructions TAKE AND LOG YOUR WEIGHT, BRING LOG WITH YOU FOR REVIEW  YOUR TARGET WEIGHT IS 174 POUNDS IF YOU GAIN >3 POUNDS/DAY -OR- >5 POUNDS/WEEK   IF YOUR WEIGHT HAS INCREASED YOU MAY TAKE FUROSEMIDE 20MG  IN THE MORNING  PLEASE READ AND FOLLOW SALTY 6-ATTACHED-1,800mg  daily  PLEASE PURCHASE AND WEAR COMPRESSION STOCKINGS DAILY AND TAKE OFF AT BEDTIME. Compression stockings are elastic socks that squeeze the legs. They help to increase blood flow to the legs and to decrease swelling in the legs from fluid retention, and reduce the chance of developing blood clots in the lower legs. Please put on in the AM when dressing and off at night when dressing for bed.  LET THEM KNOW THAT YOU NEED KNEE HIGH'S WITH COMPRESSION OF 15-20 mmhg.  [employer]  [place] ([phone]); [place], [place] [phone]; [phone]  EMAIL   [email or web address].  PLEASE MAKE SURE TO ELEVATE YOUR FEET & LEGS WHILE SITTING, THIS WILL HELP WITH THE SWELLING ALSO.   Follow-Up: Your next appointment:  3-4 MONTHS In Person with K. Mali Hilty, MD OR IF UNAVAILABLE JESSE CLEAVER, FNP-C   At [facility], you and your health needs are our priority.  As part of our continuing mission to provide you with exceptional heart care, we have created designated Provider Care Teams.  These Care Teams include your primary Cardiologist (physician) and Advanced Practice Providers (APPs -  Physician Assistants and Nurse Practitioners) who all work together to provide you with the care you need, when you need it.            6 SALTY THINGS TO AVOID     1,800MG   DAILY

## 2020-09-16 ENCOUNTER — Other Ambulatory Visit: Payer: Self-pay | Admitting: Internal Medicine

## 2020-09-17 NOTE — Telephone Encounter (Signed)
Prescription refill request for Eliquis received. Indication:atrial fib Last office visit:7/22 Scr:1.3 Age: 86 Weight:79.9 kg  Prescription refilled

## 2020-11-12 ENCOUNTER — Other Ambulatory Visit: Payer: Self-pay | Admitting: Internal Medicine

## 2020-11-18 ENCOUNTER — Ambulatory Visit: Payer: Medicare HMO | Admitting: Cardiology

## 2020-11-23 NOTE — Progress Notes (Signed)
Cardiology Clinic Note   Patient Name: Carl Harper Date of Encounter: 11/26/2020  Primary Care Provider:  Bernerd Limbo, MD Primary Cardiologist:  Pixie Casino, MD  Patient Profile    Carl Harper 86 year old male presents the clinic today for follow-up evaluation of his lower extremity edema.  Past Medical History    Past Medical History:  Diagnosis Date   Benign neoplasm of colon 10/24/2006   BPH (benign prostatic hyperplasia)    Daily headache    Diverticulosis 09/03.2008   Dysrhythmia    "irregular"   Hypertension    Internal hemorrhoids without mention of complication 73/71/0626   Memory loss    PAD (peripheral artery disease) (HCC)    Restless leg syndrome    Sleep apnea    dx'd; wore mask; lost weight; turned in machine"   Past Surgical History:  Procedure Laterality Date   APPENDECTOMY  ~ 1954   ATHERECTOMY Right 11/07/2011   Procedure: ATHERECTOMY;  Surgeon: Lorretta Harp, MD;  Location: [facility] CATH LAB;  Service: Cardiovascular;  Laterality: Right;   CARDIAC CATHETERIZATION  03/30/1999   non-critical CAD   CARDIOVERSION N/A 04/19/2020   Procedure: CARDIOVERSION;  Surgeon: Fay Records, MD;  Location: [facility] ENDOSCOPY;  Service: Cardiovascular;  Laterality: N/A;   CATARACT EXTRACTION W/ INTRAOCULAR LENS  IMPLANT, BILATERAL  2011-2012   ILIAC ARTERY STENT Right 11/07/2011   Diamondback orbital rotational atherectomy, PTA & stenting of calcified R CIA (Dr. Adora Fridge)   Michie N/A 11/07/2011   Procedure: LOWER EXTREMITY ANGIOGRAM;  Surgeon: Lorretta Harp, MD;  Location: [facility] CATH LAB;  Service: Cardiovascular;  Laterality: N/A;   NM MYOCAR PERF WALL MOTION  09/2012   lexiscan myoview - low risk with fixed inferior defect with underlying bowel attenuation suggestive of artifact   PERCUTANEOUS STENT INTERVENTION  11/07/2011   Procedure: PERCUTANEOUS STENT INTERVENTION;  Surgeon: Lorretta Harp, MD;  Location: [facility] CATH LAB;  Service:  Cardiovascular;;   SHOULDER ARTHROSCOPY W/ ROTATOR CUFF REPAIR Left 2010   Sleep Study  01/09/2011   AHI during total sleep 14.4/hr and during REM 19.4/hr   TRANSTHORACIC ECHOCARDIOGRAM  09/2012   EF 55-60%, LA mildly dilated    Allergies  Allergies  Allergen Reactions   Amiodarone Other (See Comments)    Other reaction(s): Other (See Comments) Corneal deposits Corneal deposits   Bee Venom Other (See Comments)    unknown    History of Present Illness    Carl Harper has a PMH of a sending aortic aneurysm, PVCs, paroxysmal atrial fibrillation on apixaban, essential hypertension, mild nonobstructive CAD, OSA on CPAP, atypical chest pain, and GERD.  He was started on Eliquis after being diagnosed with atrial flutter in 2017.  He was noted to have symptomatic PVCs and was placed on amiodarone.  Unfortunately, his amiodarone was stopped by his eye doctor for concerns about corneal deposits.  Dr. Curt Bears started mexiletine for his PVCs.  His repeat echocardiogram showed normal LV EF with mild left atrial enlargement in 2017.  In follow-up he was noted to have sinus bradycardia with first-degree AV block and IVCD.  Dr. 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   He was seen by Dr. Debara Pickett 05/06/2020 for lower extremity swelling.  He underwent DCCV for atrial fibrillation and maintained sinus rhythm.  His furosemide was increased to 40 mg twice daily.  His echocardiogram showed normal LVEF, no regional wall motion  abnormalities, G1 DD, mild-moderate aortic valve sclerosis and an ascending aortic aneurysm mild 5.0 cm.   He was seen by Fabian Sharp PA-C on 07/14/2020.  During that time it was noted that his CTA showed a thoracic aortic dilation of 4.8 cm which was previously 4.6 cm.  It appears to be stable.  He denied new symptoms.  His blood pressure was well controlled.  He reported falls however, the etiology was unclear.  He denied dizziness, vertigo,  palpitations prior to falls.  He was taking 1 nitroglycerin a week for chest pain.  His chest pain was stable.  It was felt that his occasional chest discomfort was not related to cardiac issues.  He had not been taking his furosemide as prescribed due to fatigue.  He reported frequent bathroom trips and it was unclear if it was also accompanied by hypotension.  His Imdur was changed to evening dosing.  His furosemide was increased to 40 mg in the morning and 20 mg at lunch for 3 days.  He was instructed on avoiding salt and elevating his lower extremities.  His EKG showed sinus bradycardia.   He presented to the clinic 08/26/2020 for follow-up evaluation stated he had much less lower extremity swelling.  He continued to walk daily and do light dumbbell weights.  He did notice occasional chest tightness however, he denies exertional chest discomfort.  He reported that these episodes were happening for several months and come and go without intervention.  His home weight  was 174 pounds and his weight in the clinic fully clothed was 176 pounds.  We reviewed low-sodium diet, the importance of lower extremity support stockings, and fluid restriction.  He and his wife expressed understanding.  I gave him a weight log, the  support stocking sheet, salty 6 diet sheet, and planned follow-up in 4 months.  He presents to the clinic today for follow-up evaluation states he feels well.  He reports compliance with his low-sodium diet and has been walking for about 15 to 20 minutes daily.  He does notice occasional episodes of chest discomfort 2-3 times per week.  He reports that he is kind of "a chicken" when it comes to chest discomfort and he does not wait to see if the pain will resolve on its own.  He reports taking nitroglycerin 1-2 times per week.  This is remained stable.  He also reports occasional episodes of dizziness 2-3 times per week that last for about 5 to 10 minutes at a time.  He denies shortness of  breath orthopnea and PND.  We reviewed the importance of wearing lower extremity support stockings.  I will have him continue his physical activity, maintain a blood pressure log, and follow-up in around 6 months.   Today denies chest pain, shortness of breath, increased lower extremity edema, fatigue, palpitations, melena, hematuria, hemoptysis, diaphoresis, weakness, presyncope, syncope, orthopnea, and PND.  Home Medications    Prior to Admission medications   Medication Sig Start Date End Date Taking? Authorizing Provider  alendronate (FOSAMAX) 70 MG tablet Take 70 mg by mouth every Sunday. 04/12/20   [provider]  aspirin 81 MG tablet Take 81 mg by mouth daily.    [provider]  Calcium Citrate 250 MG TABS Take 1-2 tablets by mouth See admin instructions. 2 tabs twice daily, 1 tab at noon    [provider]  Cholecalciferol (VITAMIN D3) 50 MCG (2000 UT) CAPS Take 2,000 Units by mouth daily.    [provider]  ELIQUIS 5 MG TABS tablet TAKE ONE TABLET BY MOUTH TWICE A DAY 09/17/20   Hilty, Nadean Corwin, MD  finasteride (PROSCAR) 5 MG tablet Take 5 mg by mouth daily. 08/09/17   [provider]  furosemide (LASIX) 20 MG tablet Take 2 tablets (40 mg total) by mouth 2 (two) times daily. 05/06/20   Hilty, Nadean Corwin, MD  ipratropium (ATROVENT) 0.03 % nasal spray  06/12/20   [provider]  isosorbide mononitrate (IMDUR) 30 MG 24 hr tablet Take 15 mg by mouth daily.    [provider]  losartan (COZAAR) 25 MG tablet TAKE ONE TABLET BY MOUTH DAILY 03/02/20   Hilty, Nadean Corwin, MD  LUTEIN PO Take 20 mg by mouth daily.    [provider]  metoprolol succinate (TOPROL-XL) 25 MG 24 hr tablet TAKE 1/2 TABLET BY MOUTH DAILY 11/12/20   Deberah Pelton, NP  Multiple Vitamin (MULTIVITAMIN WITH MINERALS) TABS tablet Take 1 tablet by mouth daily.    [provider]  Multiple Vitamins-Minerals (PRESERVISION AREDS) CAPS Take 1 capsule by  mouth in the morning and at bedtime.    [provider]  nitroGLYCERIN (NITROSTAT) 0.4 MG SL tablet Place 1 tablet (0.4 mg total) under the tongue every 5 (five) minutes as needed for chest pain. 07/27/20   Almyra Deforest, PA  pramipexole (MIRAPEX) 0.75 MG tablet Take 0.75 mg by mouth daily. 02/04/18   [provider]  solifenacin (VESICARE) 5 MG tablet Take 5 mg by mouth daily. 03/09/20   [provider]  Tamsulosin HCl (FLOMAX) 0.4 MG CAPS Take 0.4 mg by mouth in the morning and at bedtime.    [provider]  traZODone (DESYREL) 100 MG tablet Take 100 mg by mouth at bedtime. 12/19/15   [provider]  XIIDRA 5 % SOLN Place 5 % into both eyes 2 (two) times daily. 05/28/20   [provider]    Family History    Family History  Problem Relation Age of Onset   Heart Problems Father 89   Cancer Brother 81   Heart failure Brother    Cancer Maternal Grandfather        prostate   He indicated that his mother is deceased. He indicated that his father is deceased. He indicated that one of his three brothers is deceased. He indicated that his maternal grandmother is deceased. He indicated that his maternal grandfather is deceased. He indicated that his paternal grandmother is deceased. He indicated that his paternal grandfather is deceased.  Social History    Social History   Socioeconomic History   Marital status: Married    Spouse name: Not on file   Number of children: 0   Years of education: 13   Highest education level: Not on file  Occupational History   Occupation: retired    Fish farm manager: [employer]  Tobacco Use   Smoking status: Former    Packs/day: 1.00    Years: 55.00    Pack years: 55.00    Types: Cigarettes    Quit date: 06/24/1994    Years since quitting: 26.4   Smokeless tobacco: Never  Vaping Use   Vaping Use: Never used  Substance and Sexual Activity   Alcohol use: Yes    Alcohol/week: 14.0 standard drinks    Types: 14  Glasses of wine per week    Comment: 11/07/2011 "couple drinks of wine q night"   Drug use: No   Sexual activity: Not Currently  Other Topics Concern   Not on file  Social History Narrative   Not on file   Social Determinants of Health   Financial Resource Strain: Not on file  Food Insecurity: Not on file  Transportation Needs: Not on file  Physical Activity: Not on file  Stress: Not on file  Social Connections: Not on file  Intimate Partner Violence: Not on file     Review of Systems    General:  No chills, fever, night sweats or weight changes.  Cardiovascular:  No chest pain, dyspnea on exertion, edema, orthopnea, palpitations, paroxysmal nocturnal dyspnea. Dermatological: No rash, lesions/masses Respiratory: No cough, dyspnea Urologic: No hematuria, dysuria Abdominal:   No nausea, vomiting, diarrhea, bright red blood per rectum, melena, or hematemesis Neurologic:  No visual changes, wkns, changes in mental status. All other systems reviewed and are otherwise negative except as noted above.  Physical Exam    VS:  BP (!) 94/50 (BP Location: Left Arm, Patient Position: Sitting, Cuff Size: Normal)   Pulse (!) 56   Ht 5\' 10"  (1.778 m)   Wt 175 lb (79.4 kg)   BMI 25.11 kg/m  , BMI Body mass index is 25.11 kg/m. GEN: Well nourished, well developed, in no acute distress. HEENT: normal. Neck: Supple, no JVD, carotid bruits, or masses. Cardiac: RRR, no murmurs, rubs, or gallops. No clubbing, cyanosis, bilateral ankle edema nonpitting.  Radials/DP/PT 2+ and equal bilaterally.  Respiratory:  Respirations regular and unlabored, clear to auscultation bilaterally. GI: Soft, nontender, nondistended, BS + x 4. MS: no deformity or atrophy. Skin: warm and dry, no rash. Neuro:  Strength and sensation are intact. Psych: Normal affect.  Accessory Clinical Findings    Recent Labs: 04/14/2020: BUN 29; Creatinine, Ser 1.33; Hemoglobin 13.4; Magnesium 2.1; Platelets 255; Potassium 4.6;  Sodium 141; TSH 1.693   Recent Lipid Panel    Component Value Date/Time   CHOL 189 04/11/2013 1058   TRIG 59 04/11/2013 1058   HDL 77 04/11/2013 1058   LDLCALC 100 (H) 04/11/2013 1058    ECG personally reviewed by me today-none today.  Echocardiogram 06/01/2020 IMPRESSIONS     1. Left ventricular ejection fraction, by estimation, is 60 to 65%. The  left ventricle has normal function. The left ventricle has no regional  wall motion abnormalities. Left ventricular diastolic parameters are  consistent with Grade I diastolic  dysfunction (impaired relaxation).   2. Right ventricular systolic function is normal. The right ventricular  size is normal. There is normal pulmonary artery systolic pressure. The  estimated right ventricular systolic pressure is 10.9 mmHg.   3. Left atrial size was mildly dilated.   4. The mitral valve is grossly normal. Trivial mitral valve  regurgitation.   5. The aortic valve is tricuspid. There is moderate calcification of the  aortic valve. Aortic valve regurgitation is trivial. Mild to moderate  aortic valve sclerosis/calcification is present, without any evidence of  aortic stenosis.   6. Aortic dilatation noted. There is mild dilatation of the aortic root,  measuring 40 mm. There is severe dilatation of the ascending aorta,  measuring 50 mm.   7. The inferior vena cava is dilated in size with >50% respiratory  variability, suggesting right atrial pressure of 8 mmHg.   Comparison(s): No significant change from prior study. 05/09/2018: LVEF  55-60%, aortic valve calcification without stenosis, dilated ascending  aorta to 50 mm.   Assessment & Plan   1.   Lower extremity edema-bilateral  nonpitting ankle edema.  Reports compliance with furosemide.  Focusing on low-sodium meals.  He thinks that lower extremity support stockings look "crappy" Continue furosemide Heart healthy low-sodium diet-salty 6 given Increase physical activity as  tolerated Elevate lower extremities when not active Fluid restriction 48-64 ounces Continue lower extremity support stockings   Essential hypertension-BP today 94/50 well-controlled at home. Continue Imdur, losartan, metoprolol Heart healthy low-sodium diet-salty 6 given Increase physical activity as tolerated   Thoracic aortic aneurysm-on last evaluation it measured 5.0 mm on echocardiogram 06/01/2020 (stable). Plan to repeat echocardiogram 5/23   Paroxysmal atrial fibrillation-heart rate today 56.  No recent episodes of increased or irregular heartbeat.  Denies bleeding issues.  Reports compliance with apixaban.  Amiodarone previously discontinued due to corneal deposits. Continue apixaban Heart healthy low-sodium diet-salty 6 given Increase physical activity as tolerated Avoid triggers caffeine, chocolate, EtOH, dehydration etc.   Coronary artery disease-continues to report intermittent episodes of nonexertional chest tightness.  Continues daily walking and light weight activity without chest discomfort.  Previously noted to have mild nonobstructive CAD. Continue aspirin, Imdur, metoprolol Heart healthy low-sodium high-fiber diet Increase physical activity as tolerated   Disposition: Follow-up with Dr. Debara Pickett or me in 6  months.   Jossie Ng. Kolbi Altadonna NP-C    11/26/2020, 11:51 AM [place] [address] Office [phone] Fax [phone]  Notice: This dictation was prepared with Dragon dictation along with smaller phrase technology. Any transcriptional errors that result from this process are unintentional and may not be corrected upon review.  I spent 13 minutes examining this patient, reviewing medications, and using patient centered shared decision making involving her cardiac care.  Prior to her visit I spent greater than 20 minutes reviewing her past medical history,  medications, and prior cardiac tests.

## 2020-11-26 ENCOUNTER — Other Ambulatory Visit: Payer: Self-pay

## 2020-11-26 ENCOUNTER — Ambulatory Visit: Payer: Medicare HMO | Admitting: General Practice

## 2020-11-26 ENCOUNTER — Encounter: Payer: Self-pay | Admitting: General Practice

## 2020-11-26 VITALS — BP 94/50 | HR 56 | Ht 70.0 in | Wt 175.0 lb

## 2020-11-26 DIAGNOSIS — R600 Localized edema: Secondary | ICD-10-CM

## 2020-11-26 DIAGNOSIS — I712 Thoracic aortic aneurysm, without rupture, unspecified: Secondary | ICD-10-CM

## 2020-11-26 DIAGNOSIS — I10 Essential (primary) hypertension: Secondary | ICD-10-CM | POA: Diagnosis not present

## 2020-11-26 DIAGNOSIS — I2510 Atherosclerotic heart disease of native coronary artery without angina pectoris: Secondary | ICD-10-CM

## 2020-11-26 NOTE — Patient Instructions (Signed)
Medication Instructions:  The current medical regimen is effective;  continue present plan and medications as directed. Please refer to the Current Medication list given to you today.   *If you need a refill on your cardiac medications before your next appointment, please call your pharmacy*  Lab Work:   Testing/Procedures:  NONE    NONE  Special Instructions PLEASE READ AND FOLLOW SALTY 6-ATTACHED-1,800mg  daily  PLEASE INCREASE PHYSICAL ACTIVITY AS TOLERATED  CONTINUE YOUR FLUID RESTRICTION 48-64 OUNCES  MAKE SURE TO ELEVATE YOUR LEGS WHEN NOT ACTIVE  Follow-Up: Your next appointment:  6 month(s) In Person with K. Mali Hilty, MD OR IF UNAVAILABLE [place], FNP-C   At [facility], you and your health needs are our priority.  As part of our continuing mission to provide you with exceptional heart care, we have created designated Provider Care Teams.  These Care Teams include your primary Cardiologist (physician) and Advanced Practice Providers (APPs -  Physician Assistants and Nurse Practitioners) who all work together to provide you with the care you need, when you need it.            6 SALTY THINGS TO AVOID     1,800MG  DAILY

## 2020-12-02 ENCOUNTER — Encounter: Payer: Self-pay | Admitting: Cardiology

## 2020-12-02 ENCOUNTER — Ambulatory Visit: Payer: Medicare HMO | Admitting: Cardiology

## 2020-12-02 ENCOUNTER — Other Ambulatory Visit: Payer: Self-pay

## 2020-12-02 ENCOUNTER — Other Ambulatory Visit: Payer: Self-pay | Admitting: Internal Medicine

## 2020-12-02 VITALS — BP 98/60 | HR 60 | Ht 70.0 in | Wt 180.0 lb

## 2020-12-02 DIAGNOSIS — I4819 Other persistent atrial fibrillation: Secondary | ICD-10-CM | POA: Diagnosis not present

## 2020-12-02 NOTE — Progress Notes (Signed)
Electrophysiology Office Note   Date:  12/02/2020   ID:  Carl Harper, DOB 1934-11-27, MRN [id number]  PCP:  Bernerd Limbo, MD  Cardiologist:  Lyman Bishop Primary Electrophysiologist:  Constance Haw, MD    No chief complaint on file.    History of Present Illness: Carl Harper is a 86 y.o. male who presents today for electrophysiology evaluation.  He has a history of [place] peripheral arterial disease with rotational atherectomy in 2013 and stenting of the right common iliac artery.  He also has mild nonobstructive coronary artery disease by cath in 2001 and a negative Myoview in 2011.  He has obstructive sleep apnea on CPAP.  He underwent stress testing which showed PVCs.  He wore a cardiac monitor that showed a 17% burden.  He was initially on amiodarone but had corneal deposits.  He was switched to mexiletine.  He has atrial fibrillation and is on Eliquis.  He had a cardioversion 04/19/2020.  Today, denies symptoms of palpitations, chest pain, shortness of breath, orthopnea, PND, lower extremity edema, claudication, dizziness, presyncope, syncope, bleeding, or neurologic sequela. The patient is tolerating medications without difficulties.  Since being seen he has done well.  He has no chest pain or shortness of breath.  He is able to all of his daily activities without restriction.  He is unaware of palpitations or arrhythmias.  He is overall happy with his control.   Past Medical History:  Diagnosis Date   Benign neoplasm of colon 10/24/2006   BPH (benign prostatic hyperplasia)    Daily headache    Diverticulosis 09/03.2008   Dysrhythmia    "irregular"   Hypertension    Internal hemorrhoids without mention of complication 57/84/6962   Memory loss    PAD (peripheral artery disease) (HCC)    Restless leg syndrome    Sleep apnea    dx'd; wore mask; lost weight; turned in machine"   Past Surgical History:  Procedure Laterality Date   APPENDECTOMY  ~ 1954    ATHERECTOMY Right 11/07/2011   Procedure: ATHERECTOMY;  Surgeon: Lorretta Harp, MD;  Location: [facility] CATH LAB;  Service: Cardiovascular;  Laterality: Right;   CARDIAC CATHETERIZATION  03/30/1999   non-critical CAD   CARDIOVERSION N/A 04/19/2020   Procedure: CARDIOVERSION;  Surgeon: Fay Records, MD;  Location: [facility] ENDOSCOPY;  Service: Cardiovascular;  Laterality: N/A;   CATARACT EXTRACTION W/ INTRAOCULAR LENS  IMPLANT, BILATERAL  2011-2012   ILIAC ARTERY STENT Right 11/07/2011   Diamondback orbital rotational atherectomy, PTA & stenting of calcified R CIA (Dr. Adora Fridge)   [place] N/A 11/07/2011   Procedure: LOWER EXTREMITY ANGIOGRAM;  Surgeon: Lorretta Harp, MD;  Location: [facility] CATH LAB;  Service: Cardiovascular;  Laterality: N/A;   NM MYOCAR PERF WALL MOTION  09/2012   lexiscan myoview - low risk with fixed inferior defect with underlying bowel attenuation suggestive of artifact   PERCUTANEOUS STENT INTERVENTION  11/07/2011   Procedure: PERCUTANEOUS STENT INTERVENTION;  Surgeon: Lorretta Harp, MD;  Location: [facility] CATH LAB;  Service: Cardiovascular;;   SHOULDER ARTHROSCOPY W/ ROTATOR CUFF REPAIR Left 2010   Sleep Study  01/09/2011   AHI during total sleep 14.4/hr and during REM 19.4/hr   TRANSTHORACIC ECHOCARDIOGRAM  09/2012   EF 55-60%, LA mildly dilated     Current Outpatient Medications  Medication Sig Dispense Refill   alendronate (FOSAMAX) 70 MG tablet Take 70 mg by mouth every Sunday.     aspirin 81 MG tablet Take  81 mg by mouth daily.     Calcium Citrate 250 MG TABS Take 1-2 tablets by mouth See admin instructions. 2 tabs twice daily, 1 tab at noon     Cholecalciferol (VITAMIN D3) 50 MCG (2000 UT) CAPS Take 2,000 Units by mouth daily.     ELIQUIS 5 MG TABS tablet TAKE ONE TABLET BY MOUTH TWICE A DAY 60 tablet 5   finasteride (PROSCAR) 5 MG tablet Take 5 mg by mouth daily.     furosemide (LASIX) 20 MG tablet Take 2 tablets (40 mg total) by mouth 2 (two) times daily.  120 tablet 11   ipratropium (ATROVENT) 0.03 % nasal spray      isosorbide mononitrate (IMDUR) 30 MG 24 hr tablet Take 15 mg by mouth daily.     losartan (COZAAR) 25 MG tablet TAKE ONE TABLET BY MOUTH DAILY 90 tablet 2   LUTEIN PO Take 20 mg by mouth daily.     metoprolol succinate (TOPROL-XL) 25 MG 24 hr tablet TAKE 1/2 TABLET BY MOUTH DAILY 45 tablet 2   Multiple Vitamin (MULTIVITAMIN WITH MINERALS) TABS tablet Take 1 tablet by mouth daily.     Multiple Vitamins-Minerals (PRESERVISION AREDS) CAPS Take 1 capsule by mouth in the morning and at bedtime.     nitroGLYCERIN (NITROSTAT) 0.4 MG SL tablet Place 1 tablet (0.4 mg total) under the tongue every 5 (five) minutes as needed for chest pain. 25 tablet 11   pramipexole (MIRAPEX) 0.75 MG tablet Take 0.75 mg by mouth daily.     solifenacin (VESICARE) 5 MG tablet Take 5 mg by mouth daily.     Tamsulosin HCl (FLOMAX) 0.4 MG CAPS Take 0.4 mg by mouth in the morning and at bedtime.     traZODone (DESYREL) 100 MG tablet Take 100 mg by mouth at bedtime.  3   XIIDRA 5 % SOLN Place 5 % into both eyes 2 (two) times daily.     No current facility-administered medications for this visit.    Allergies:   Amiodarone and Bee venom   Social History:  The patient  reports that he quit smoking about 26 years ago. His smoking use included cigarettes. He has a 55.00 pack-year smoking history. He has never used smokeless tobacco. He reports current alcohol use of about 14.0 standard drinks per week. He reports that he does not use drugs.   Family History:  The patient's family history includes Cancer in his maternal grandfather; Cancer (age of onset: 34) in his brother; Heart Problems (age of onset: 48) in his father; Heart failure in his brother.   ROS:  Please see the history of present illness.   Otherwise, review of systems is positive for none.   All other systems are reviewed and negative.   PHYSICAL EXAM: VS:  BP 98/60   Pulse 60   Ht 5\' 10"  (1.778 m)    Wt 180 lb (81.6 kg)   SpO2 97%   BMI 25.83 kg/m  , BMI Body mass index is 25.83 kg/m. GEN: Well nourished, well developed, in no acute distress  HEENT: normal  Neck: no JVD, carotid bruits, or masses Cardiac: RRR; no murmurs, rubs, or gallops,no edema  Respiratory:  clear to auscultation bilaterally, normal work of breathing GI: soft, nontender, nondistended, + BS MS: no deformity or atrophy  Skin: warm and dry Neuro:  Strength and sensation are intact Psych: euthymic mood, full affect  EKG:  EKG is ordered today. Personal review of the ekg ordered shows  sinus rhythm, rate 60  Recent Labs: 04/14/2020: BUN 29; Creatinine, Ser 1.33; Hemoglobin 13.4; Magnesium 2.1; Platelets 255; Potassium 4.6; Sodium 141; TSH 1.693    Lipid Panel     Component Value Date/Time   CHOL 189 04/11/2013 1058   TRIG 59 04/11/2013 1058   HDL 77 04/11/2013 1058   LDLCALC 100 (H) 04/11/2013 1058     Wt Readings from Last 3 Encounters:  12/02/20 180 lb (81.6 kg)  11/26/20 175 lb (79.4 kg)  08/26/20 176 lb 3.2 oz (79.9 kg)      Other studies Reviewed: Additional studies/ records that were reviewed today include: holter personally reviewed Review of the above records today demonstrates:  PAF, SVT, short runs of VT and ventricular bigeminy. 2.2 second pause, lowest HR of 35.  32% PVCs  3/16 MPI:  No perfusion defects to suggest ischemia. EF 71%.  TTE 11/24/15 - Left ventricle: The cavity size was normal. Wall thickness was   normal. Systolic function was normal. The estimated ejection   fraction was in the range of 55% to 60%. Wall motion was normal;   there were no regional wall motion abnormalities. Left   ventricular diastolic function parameters were normal. - Left atrium: The atrium was mildly dilated.  Holter Minimum HR: 34 BPM at 8:30:44 AM Maximum HR: 109 BPM at 8:49:36 PM Average HR: 66 BPM Sinus rhythm 17.4% PVCs 1.7% APCs Occasional atrial and ventricular  bigeminy  ASSESSMENT AND PLAN:  1.  Persistent atrial fibrillation: Currently on Eliquis.  CHA2DS2-VASc of 5.  Has been taken off of amiodarone due to coronary deposits.  Is status post cardioversion 02/16/2021.  He remains in sinus rhythm without issue.  We Carl Harper continue with current management.  2.  PVCs: 17% burden on cardiac monitor.  No PVCs on ECG today.  Continue with current management.  3.  Coronary artery disease: No current chest pain.  Current medicines are reviewed at length with the patient today.   The patient does not have concerns regarding his medicines.  The following changes were made today: None  Labs/ tests ordered today include:  Orders Placed This Encounter  Procedures   EKG 12-Lead      Disposition:   FU with Carl Harper 12 months  Signed, Carl Blatchford Meredith Leeds, MD  12/02/2020 9:41 AM     [place] [address] [phone] (office) [phone] (fax)

## 2020-12-02 NOTE — Patient Instructions (Signed)
Medication Instructions:  Your physician recommends that you continue on your current medications as directed. Please refer to the Current Medication list given to you today.  *If you need a refill on your cardiac medications before your next appointment, please call your pharmacy*   Lab Work: None ordered   Testing/Procedures: None ordered   Follow-Up: At [facility], you and your health needs are our priority.  As part of our continuing mission to provide you with exceptional heart care, we have created designated Provider Care Teams.  These Care Teams include your primary Cardiologist (physician) and Advanced Practice Providers (APPs -  Physician Assistants and Nurse Practitioners) who all work together to provide you with the care you need, when you need it.  We recommend signing up for the patient portal called "MyChart".  Sign up information is provided on this After Visit Summary.  MyChart is used to connect with patients for Virtual Visits (Telemedicine).  Patients are able to view lab/test results, encounter notes, upcoming appointments, etc.  Non-urgent messages can be sent to your provider as well.   To learn more about what you can do with MyChart, go to [email or web address].    Your next appointment:   1 year(s)  The format for your next appointment:   In Person  Provider:   You will see one of the following Advanced Practice Providers on your designated Care Team:   Tommye Standard, [place] Legrand Como "Jonni Sanger" Chalmers Cater, [place]    Thank you for choosing [facility]!!   Trinidad Curet, RN [phone]

## 2020-12-03 ENCOUNTER — Other Ambulatory Visit (HOSPITAL_COMMUNITY): Payer: Self-pay | Admitting: Internal Medicine

## 2020-12-03 DIAGNOSIS — Z95828 Presence of other vascular implants and grafts: Secondary | ICD-10-CM

## 2020-12-16 ENCOUNTER — Other Ambulatory Visit: Payer: Self-pay

## 2020-12-16 ENCOUNTER — Ambulatory Visit (HOSPITAL_COMMUNITY)
Admission: RE | Admit: 2020-12-16 | Discharge: 2020-12-16 | Disposition: A | Discharge status: Home / Self Care | Payer: Medicare HMO | Source: Ambulatory Visit | Attending: Cardiology | Admitting: Cardiology

## 2020-12-16 ENCOUNTER — Ambulatory Visit (HOSPITAL_BASED_OUTPATIENT_CLINIC_OR_DEPARTMENT_OTHER)
Admission: RE | Admit: 2020-12-16 | Discharge: 2020-12-16 | Disposition: A | Discharge status: Home / Self Care | Payer: Medicare HMO | Source: Ambulatory Visit | Attending: Cardiology | Admitting: Cardiology

## 2020-12-16 DIAGNOSIS — Z95828 Presence of other vascular implants and grafts: Secondary | ICD-10-CM | POA: Insufficient documentation

## 2020-12-17 IMAGING — CT CT ANGIOGRAPHY CHEST
2 of 8 series · 18 of 46 positions shown · IV contrast (iopamidol)
Comparison: None.

CLINICAL DATA: Thoracic aortic aneurysm.

EXAM:
CT ANGIOGRAPHY CHEST WITH CONTRAST
TECHNIQUE: Multidetector CT imaging of the chest was performed using the
standard protocol during bolus administration of intravenous
contrast. Multiplanar CT image reconstructions and MIPs were
obtained to evaluate the vascular anatomy.
CONTRAST:  75mL [id number] IOPAMIDOL (UHA826-UBV) INJECTION 76%

[Series 7: dissection 3.0 i31f 2 · axial · 0.70mm/px · z∈[-281,+10]mm · 15 of 111 slices shown]
[im 7/111  lung]
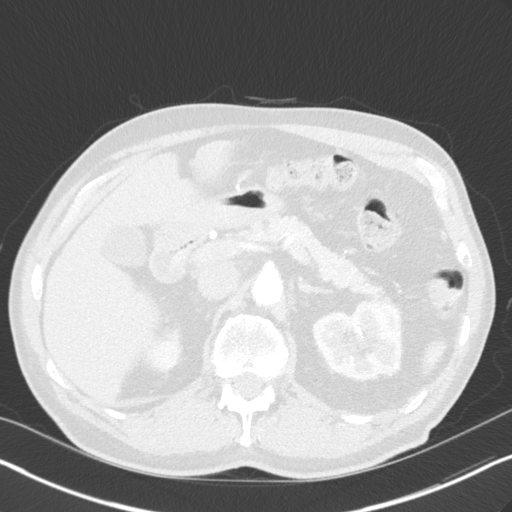
[im 13/111  soft-tissue]
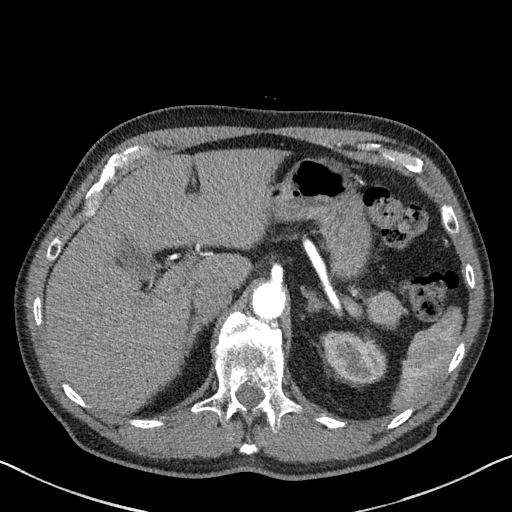
[im 19/111  lung]
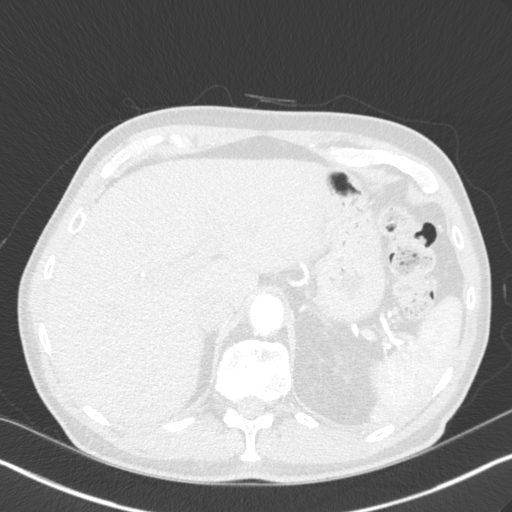
[im 25/111  soft-tissue]
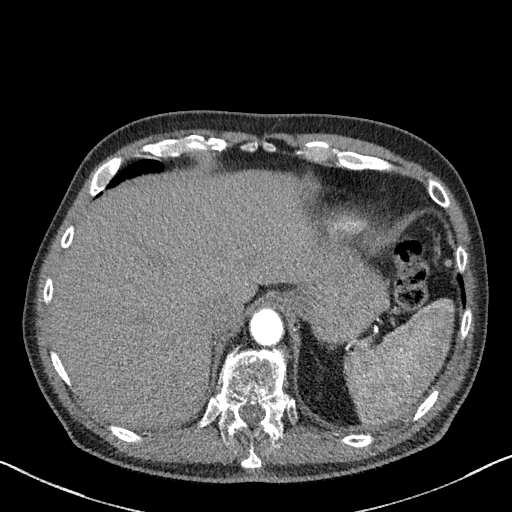
[im 37/111  lung]
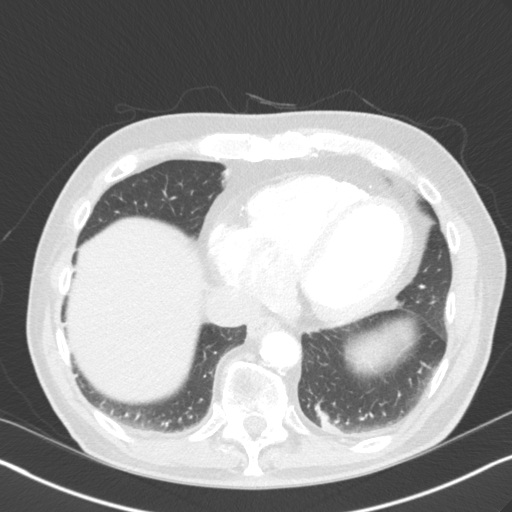
[im 43/111  soft-tissue]
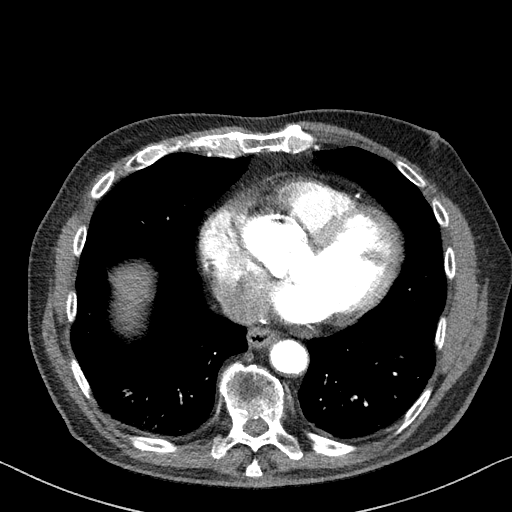
[im 49/111  lung]
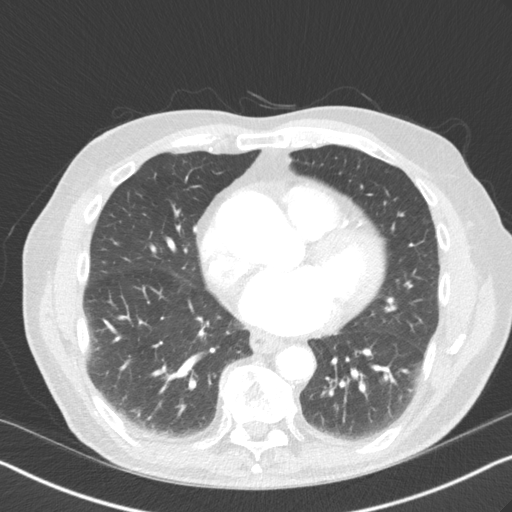
[im 56/111  soft-tissue]
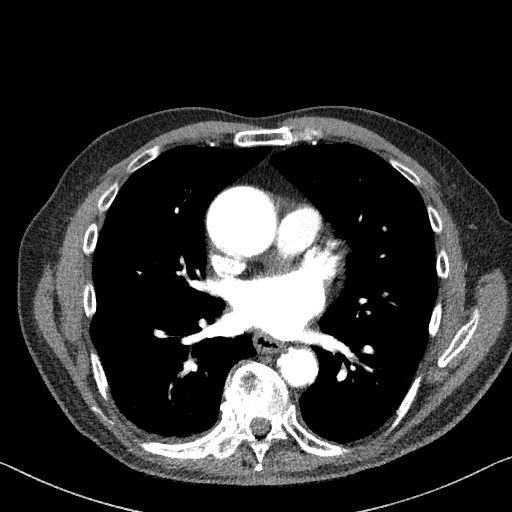
[im 62/111  lung]
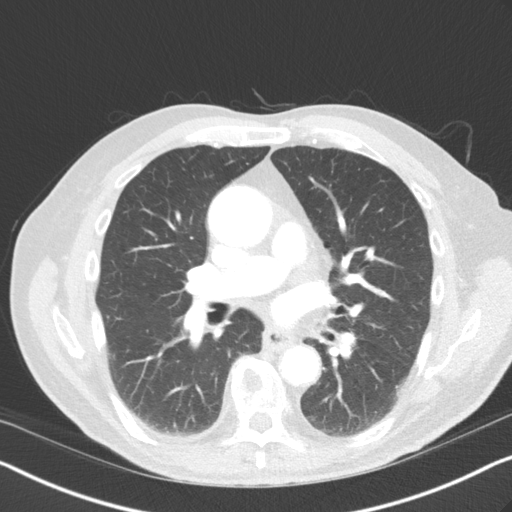
[im 68/111  soft-tissue]
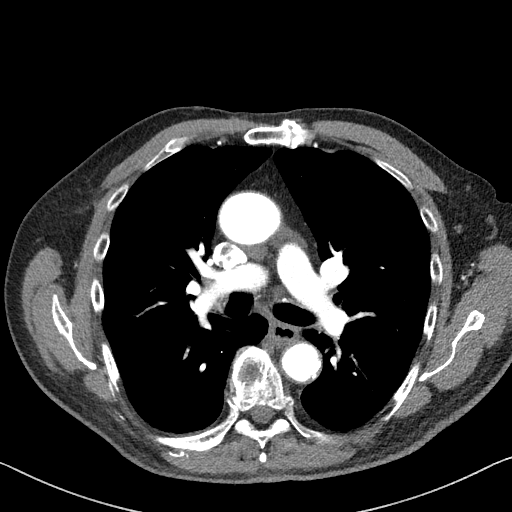
[im 74/111  lung]
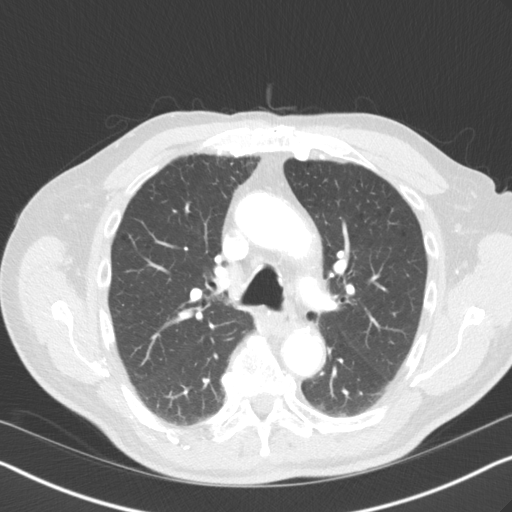
[im 86/111  soft-tissue]
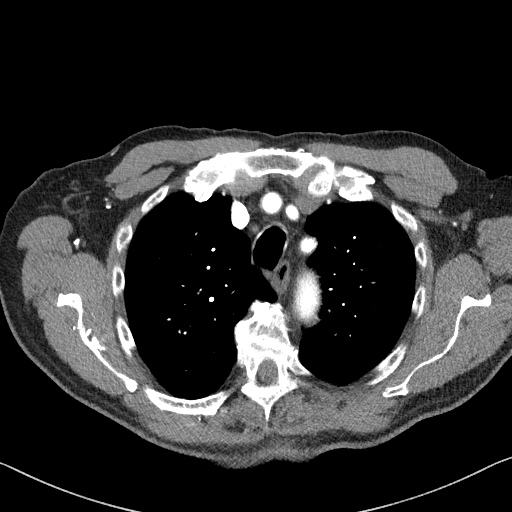
[im 92/111  lung]
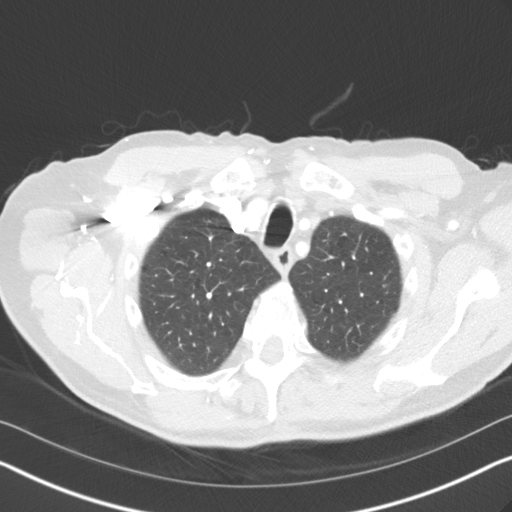
[im 98/111  soft-tissue]
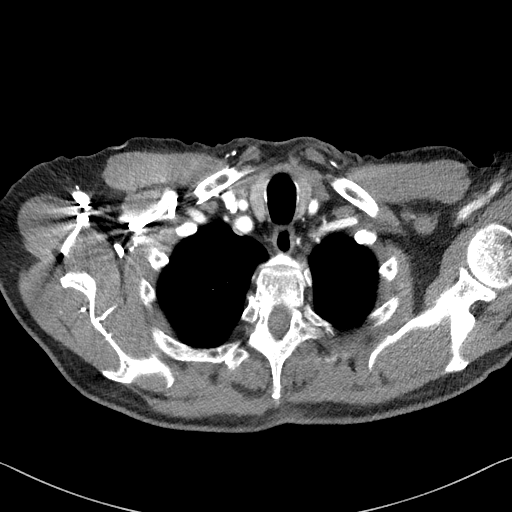
[im 104/111  lung]
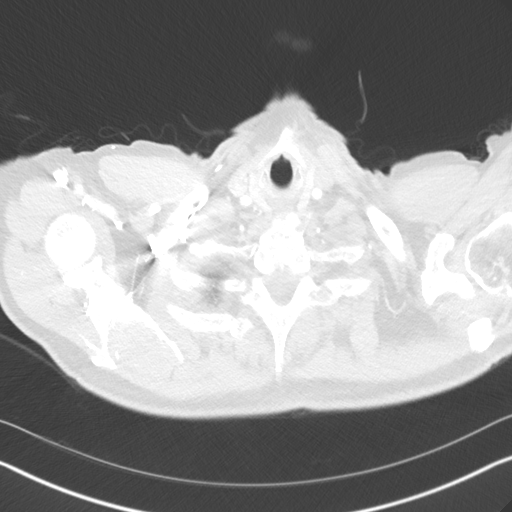

[Series 10: coronals · coronal · 0.65mm/px · 3 of 150 slices shown]
[im 38/150  soft-tissue]
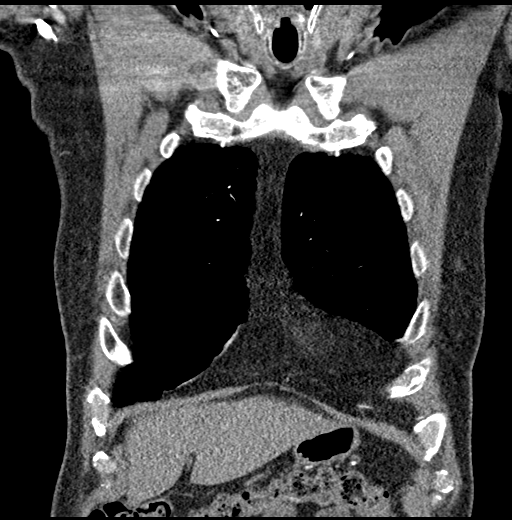
[im 75/150  soft-tissue]
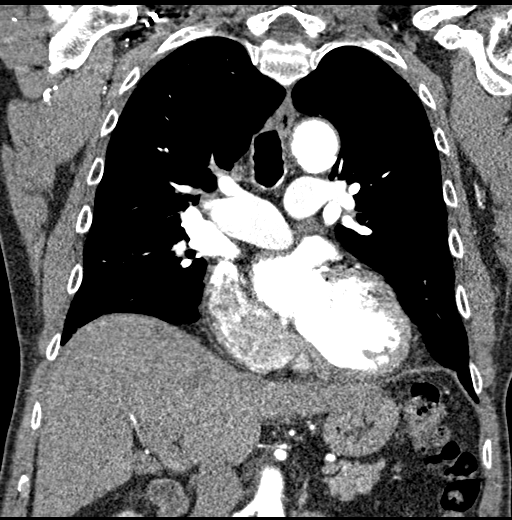
[im 112/150  soft-tissue]
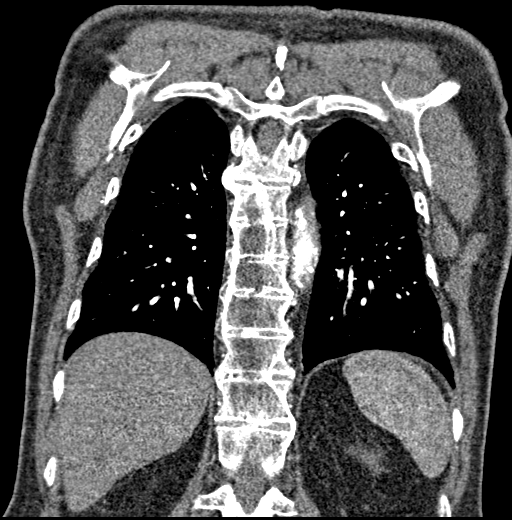

[18 of 46 positions shown; findings below may reference images not displayed]

FINDINGS: Cardiovascular: 4.8 cm ascending thoracic aortic aneurysm is noted.
There is no evidence of aortic dissection. Atherosclerosis of
thoracic aorta is noted. Transverse aortic arch measures 3.3 cm.
Proximal descending thoracic aorta measures 2.7 cm. Great vessels
are widely patent without significant stenosis. Normal cardiac size.
No pericardial effusion is noted. Coronary artery calcifications are
noted.

Mediastinum/Nodes: No enlarged mediastinal, hilar, or axillary lymph
nodes. Thyroid gland, trachea, and esophagus demonstrate no
significant findings.

Lungs/Pleura: No pneumothorax or pleural effusion is noted. Minimal
emphysematous disease is noted in the upper lobes bilaterally.
Minimal left posterior basilar subsegmental atelectasis is noted.

Upper Abdomen: No acute abnormality.

Musculoskeletal: No chest wall abnormality. No acute or significant
osseous findings.

Review of the MIP images confirms the above findings.
IMPRESSION: 4.8 cm ascending thoracic aortic aneurysm. Recommend semi-annual
imaging followup by CTA or MRA and referral to cardiothoracic
surgery if not already obtained. This recommendation follows 1747
ACCF/AHA/AATS/ACR/ASA/SCA/NDANGANENI/PASCUALA/DABAN/CLAUS Guidelines for the
Diagnosis and Management of Patients With Thoracic Aortic Disease.
Circulation. 1747; 121: E266-e369. Aortic aneurysm NOS
(2QZ74-YLY.S).

Coronary artery calcifications are noted.

Aortic Atherosclerosis (2QZ74-6G1.1) and Emphysema (2QZ74-8W4.D).

## 2020-12-24 ENCOUNTER — Other Ambulatory Visit: Payer: Self-pay | Admitting: Internal Medicine

## 2020-12-24 DIAGNOSIS — I739 Peripheral vascular disease, unspecified: Secondary | ICD-10-CM

## 2020-12-24 DIAGNOSIS — Z95828 Presence of other vascular implants and grafts: Secondary | ICD-10-CM

## 2020-12-28 ENCOUNTER — Inpatient Hospital Stay: Admission: RE | Admit: 2020-12-28 | Payer: Medicare HMO | Source: Ambulatory Visit

## 2020-12-28 ENCOUNTER — Other Ambulatory Visit: Payer: Self-pay

## 2020-12-28 ENCOUNTER — Ambulatory Visit
Admission: RE | Admit: 2020-12-28 | Discharge: 2020-12-28 | Disposition: A | Discharge status: Home / Self Care | Payer: Medicare HMO | Source: Ambulatory Visit | Attending: Physician Assistant | Admitting: Physician Assistant

## 2020-12-28 DIAGNOSIS — I712 Thoracic aortic aneurysm, without rupture, unspecified: Secondary | ICD-10-CM

## 2020-12-28 MED ORDER — IOPAMIDOL (ISOVUE-370) INJECTION 76%
75.0000 mL | Freq: Once | INTRAVENOUS | Status: AC | PRN
  Administered 2020-12-28: 75 mL via INTRAVENOUS

## 2021-03-27 ENCOUNTER — Other Ambulatory Visit: Payer: Self-pay | Admitting: Internal Medicine

## 2021-03-28 NOTE — Telephone Encounter (Signed)
Prescription refill request for Eliquis received. Indication:Afib Last office visit:10/22 Scr:1.3 Age: 87 Weight:81.6 kg  Prescription refilled

## 2021-06-23 ENCOUNTER — Telehealth: Payer: Self-pay

## 2021-06-23 NOTE — Telephone Encounter (Signed)
REFERRAL SCANNED TO REFERRAL ?

## 2021-06-24 ENCOUNTER — Telehealth: Payer: Self-pay

## 2021-06-24 NOTE — Telephone Encounter (Signed)
NOTES SCANNED TO REFERRAL 

## 2021-06-27 ENCOUNTER — Encounter: Payer: Self-pay | Admitting: Internal Medicine

## 2021-06-27 ENCOUNTER — Ambulatory Visit: Payer: Medicare HMO | Admitting: Internal Medicine

## 2021-06-27 VITALS — BP 96/52 | HR 53 | Ht 70.0 in | Wt 177.0 lb

## 2021-06-27 DIAGNOSIS — I712 Thoracic aortic aneurysm, without rupture, unspecified: Secondary | ICD-10-CM | POA: Diagnosis not present

## 2021-06-27 DIAGNOSIS — R079 Chest pain, unspecified: Secondary | ICD-10-CM

## 2021-06-27 DIAGNOSIS — I2510 Atherosclerotic heart disease of native coronary artery without angina pectoris: Secondary | ICD-10-CM | POA: Diagnosis not present

## 2021-06-27 DIAGNOSIS — I480 Paroxysmal atrial fibrillation: Secondary | ICD-10-CM

## 2021-06-27 MED ORDER — ISOSORBIDE MONONITRATE ER 30 MG PO TB24: 30.0000 mg | ORAL_TABLET | Freq: Every day | ORAL | 3 refills | Status: DC

## 2021-06-27 NOTE — Progress Notes (Signed)
? ? ?OFFICE NOTE ? ?Chief Complaint:  ?Chest pain ? ?Primary Care Physician: ?Bernerd Limbo, MD ? ?HPI:  ?Carl Harper is a pleasant 87 year old male previously followed Dr. Rollene Fare with a history of peripheral arterial disease. He underwent diamondback orbital rotational atherectomy by Dr. Gwenlyn Found in 2013 with stenting of the calcified right common iliac stenosis. He has mild nonobstructive coronary disease by cath in 2001 and a negative Myoview in 2011. He also has obstructive sleep apnea on CPAP has had reflux symptoms and atypical chest pain from time to time.  His echocardiogram does show mild concentric LVH and borderline aortic root dilatation with a possible small ascending aortic aneurysm which will need to be followed. At his last visit with Dr. Rollene Fare his Toprol was cut down to 25 mg alternating with 12.5 mg every 2 some bradycardia.  ? ?[name] was recently seen by Dr. Percival Spanish in the office for shortness of breath and palpitations as well as exertional dyspnea. He wore the monitor however that was never performed as no monitors were available. He did undergo an exercise tolerance test for which he exercised for 6 minutes and 7 metabolic equivalents. There was no evidence of ischemia. He did have PVCs and a fairly flat blood pressure response to exercise. There was marked dyspnea with exertion. He also is reported recent tremors, some memory loss and instability with his gait. ? ?[name] returns today and is complaining of a sinking spell his chest. When pressed further he felt like he was possibly presyncopal occasionally has trouble getting his breath. He has some occasional dizziness. He was evaluated for monitor however none was placed because there was not a monitor available. He did have his metoprolol increased to 12.5/25 mg every other day. He seems to think that this is helped his symptoms and feels better than he had when he saw Dr. Percival Spanish. ? ?I saw [name] back in the office  today. He says that he call and make an appointment about a month ago because his been feeling some episodes of chest discomfort in the morning. He's also felt somewhat short of breath. An EKG in the office today demonstrates new onset atrial flutter with ventricular bigeminy at a rate of 77. This is a new diagnosis for him. I spent greater than 10 minutes discussing atrial fibrillation and anticoagulation options with him. He understands his options and the need for trying to establish a sinus rhythm. ? ?[name] returns today the office for follow-up. His EKG today fortunately shows sinus rhythm with PVCs. This is good news as we will not need to schedule him for a cardioversion. He has been taking Eliquis and feels that this is tolerable. He denies any bleeding problems. Here she does feel improvement in energy and I suspect this is from converting back to sinus rhythm at some point. Based on his TIA event, I suspect it may been related to paroxysmal atrial flutter. He did have carotid Dopplers which show very mild bilateral carotid disease and are not likely the source of his TIA event. ? ?I saw [name] back today in the office. He is maintaining sinus bradycardia with first degree AV block at a rate of 52. He denies any bleeding problems on Eliquis. He is on low-dose amiodarone. This was started by Dr. Curt Bears on 12/30/14, after monitor was placed which showed a very high burden of PVCs greater than 30% as well as A. fib. A repeat echo was performed which showed preserved LV  systolic function. Mr. Goetze was symptomatic with his PVCs. He reports a marked reduction in his symptoms after starting the amiodarone. I do not see these had baseline pulmonary function testing, thyroid or liver function tests. ? ?11/09/2015 ? ?[name] returns today for follow-up. He has had recent worsening shortness of breath and significant fatigue and leg weakness with exercise. This is of fairly recent finding. He was  seen by Dr. Curt Bears, who noted that recently Mr. Morganti ophthalmologist had recommended discontinuing amiodarone due to corneal deposits. Since that time, he has been having a washout of the amiodarone with the plan to reassess his burden of PVCs by monitoring at the end of September. This is a ready been scheduled. His last echo was in 2016 and showed normal LV function, but he has had new symptoms as described above and would likely benefit from a repeat echo to rule out cardiomyopathy. ? ?02/04/2016 ? ?[name] returns today for follow-up. He has been seen by Dr. Curt Bears for evaluation of his a-fib. He was on amiodarone, but we discontinued it due to corneal deposits. He has maintained sinus rhythm without recurrent atrial fibrillation. PVCs were noted and noted to be significant and he was started on mexiletine. Since that he's had some improvement in his symptoms although was noted to have some PVCs today. He has a follow-up with Dr. Curt Bears in a few months. We repeated an echocardiogram which showed normal systolic and diastolic function with mild left atrial enlargement. ? ?08/10/2016 ? ?[name] returns today. He reports his palpitations have improved significantly with the addition of mexiletine. He is seen Dr. Curt Bears with cardiac electrophysiology for this. Blood pressure is well-controlled today 102/52. EKG which is personally reviewed shows sinus bradycardia with marked sinus arrhythmia and first-degree AV block at 53. QTC is 377 ms with a left anterior fascicular block and first-degree AV block.  ? ?02/05/2017 ? ?[name] was seen today in follow-up.  Overall is without complaints.  He denies any significant palpitations, shortness of breath, chest pain or lower extremity claudication.  He is been successfully treated on mexiletine and although he continues to have some PVCs, the frequency and burden have decreased significantly.  Blood pressure is at goal.  EKG was personally reviewed  today which is sinus rhythm, first-degree AV block and occasional PVCs, left anterior fascicular block and QTC 413 ms. ? ?09/11/2017 ? ?Mr. Verba returns today for follow-up.  Again he is doing well without complaints.  He denies any palpitations or significant arrhythmias.  He denies any chest pain or worsening shortness of breath.  He occasionally gets some fatigue with working out in the hot weather for long periods of time.  This may be related to some low normal blood pressure.  He is using Lasix as needed.  Blood pressure today was 108/58 however he generally says that his blood pressures around 161 systolic. ? ?11/28/2017 ? ?Mr. Charter is seen today in follow-up.  He seems to be doing well.  He denies any recurrent atrial fibrillation.  He is tolerating Eliquis without bleeding issues.  Blood pressures well controlled today 121/60.  He denies any PVCs and he is compliant with mexiletine and metoprolol.  EKG shows sinus bradycardia with first-degree AV block, nonspecific IVCD with QTC of 411 ms.  His only other concern is he has some right leg pain.  He does have a history of PAD.  He says sometimes this is worse with exertion and improves at rest but at other  times he gets a pain in his right hip when laying in bed, which is more concerning for a bursitis or possibly osteoarthritis. ? ?09/02/2018 ? ?Mr. Tapper is seen today for follow-up.  He he had to virtual visit since I last saw him in the office.  He had some chest pain and underwent a CT of the chest because of a dilated aorta.  This showed aortic ectasia measuring between 4.8 and 5 cm.  The previous 5 cm measurement was in 2001 however his most recent CT measurement was less significant.  There was no evidence of dissection.  I had increased his Imdur however he reduced it to 30 mg daily.  He notes that he is only taking sublingual nitro about once a week.  In general he is fairly comfortable.  His EKG incidentally did show atrial fibrillation  today which is rate controlled.  He is on mexiletine and Eliquis. ? ?01/06/2019 ? ?Mr. Metzner returns for follow-up.  He is complaining of some fatigue and weakness.  He just had a repeat CT which showed s

## 2021-06-27 NOTE — Patient Instructions (Signed)
Medication Instructions:  ?INCREASE isosorbide mononitrate to '30mg'$  daily ? ?*If you need a refill on your cardiac medications before your next appointment, please call your pharmacy* ? ? ?Follow-Up: ?At [facility], you and your health needs are our priority.  As part of our continuing mission to provide you with exceptional heart care, we have created designated Provider Care Teams.  These Care Teams include your primary Cardiologist (physician) and Advanced Practice Providers (APPs -  Physician Assistants and Nurse Practitioners) who all work together to provide you with the care you need, when you need it. ? ?We recommend signing up for the patient portal called "MyChart".  Sign up information is provided on this After Visit Summary.  MyChart is used to connect with patients for Virtual Visits (Telemedicine).  Patients are able to view lab/test results, encounter notes, upcoming appointments, etc.  Non-urgent messages can be sent to your provider as well.   ?To learn more about what you can do with MyChart, go to [email or web address].   ? ?Your next appointment:   ? ?6 months with Dr. Debara Pickett  ?

## 2021-07-28 ENCOUNTER — Other Ambulatory Visit: Payer: Self-pay | Admitting: Physician Assistant

## 2021-09-21 ENCOUNTER — Other Ambulatory Visit: Payer: Self-pay | Admitting: Internal Medicine

## 2021-09-21 NOTE — Telephone Encounter (Signed)
Prescription refill request for Eliquis received. Indication: Atrial Fib/Flutter Last office visit: 06/27/21  Alma Friendly MD Scr: 1.37 on 06/22/21 Age: 87 Weight: 80.3kg  Based on above findings Eliquis '5mg'$  twice daily is the appropriate dose.  Refill approved.

## 2021-10-03 ENCOUNTER — Other Ambulatory Visit: Payer: Self-pay | Admitting: Internal Medicine

## 2021-10-06 ENCOUNTER — Telehealth: Payer: Self-pay | Admitting: Internal Medicine

## 2021-10-06 MED ORDER — APIXABAN 5 MG PO TABS: 5.0000 mg | ORAL_TABLET | Freq: Two times a day (BID) | ORAL | 5 refills | Status: DC

## 2021-10-06 NOTE — Telephone Encounter (Signed)
*  STAT* If patient is at the pharmacy, call can be transferred to refill team.   1. Which medications need to be refilled? (please list name of each medication and dose if known)   ELIQUIS 5 MG TABS tablet    2. Which pharmacy/location (including street and city if local pharmacy) is medication to be sent to? [id number] - [place], [place]  3. Do they need a 30 day or 90 day supply?  30 day

## 2021-10-06 NOTE — Telephone Encounter (Signed)
Prescription refill request for Eliquis received. Indication: afib  Last office visit: [place], 06/27/2021 Scr: 1.37, 06/22/2021 Age: 87 Weight:  80.3 kg

## 2021-12-16 ENCOUNTER — Ambulatory Visit (HOSPITAL_COMMUNITY)
Admission: RE | Admit: 2021-12-16 | Discharge: 2021-12-16 | Disposition: A | Discharge status: Home / Self Care | Payer: Medicare HMO | Source: Ambulatory Visit | Attending: Cardiology | Admitting: Cardiology

## 2021-12-16 ENCOUNTER — Ambulatory Visit (HOSPITAL_BASED_OUTPATIENT_CLINIC_OR_DEPARTMENT_OTHER)
Admission: RE | Admit: 2021-12-16 | Discharge: 2021-12-16 | Disposition: A | Discharge status: Home / Self Care | Payer: Medicare HMO | Source: Ambulatory Visit | Attending: Internal Medicine | Admitting: Internal Medicine

## 2021-12-16 DIAGNOSIS — Z95828 Presence of other vascular implants and grafts: Secondary | ICD-10-CM | POA: Diagnosis not present

## 2021-12-16 DIAGNOSIS — I739 Peripheral vascular disease, unspecified: Secondary | ICD-10-CM

## 2021-12-29 ENCOUNTER — Ambulatory Visit: Payer: Medicare HMO | Attending: Internal Medicine | Admitting: Internal Medicine

## 2021-12-29 VITALS — BP 114/66 | HR 68 | Ht 68.0 in | Wt 174.2 lb

## 2021-12-29 DIAGNOSIS — I480 Paroxysmal atrial fibrillation: Secondary | ICD-10-CM | POA: Diagnosis not present

## 2021-12-29 DIAGNOSIS — I7121 Aneurysm of the ascending aorta, without rupture: Secondary | ICD-10-CM | POA: Diagnosis not present

## 2021-12-29 DIAGNOSIS — I10 Essential (primary) hypertension: Secondary | ICD-10-CM

## 2021-12-29 DIAGNOSIS — I739 Peripheral vascular disease, unspecified: Secondary | ICD-10-CM

## 2021-12-29 NOTE — Progress Notes (Signed)
OFFICE NOTE  Chief Complaint:  No complaints  Primary Care Physician: Bernerd Limbo, MD  HPI:  Carl Harper is a pleasant 87 year old male previously followed Dr. Rollene Fare with a history of peripheral arterial disease. He underwent diamondback orbital rotational atherectomy by Dr. Gwenlyn Found in 2013 with stenting of the calcified right common iliac stenosis. He has mild nonobstructive coronary disease by cath in 2001 and a negative Myoview in 2011. He also has obstructive sleep apnea on CPAP has had reflux symptoms and atypical chest pain from time to time.  His echocardiogram does show mild concentric LVH and borderline aortic root dilatation with a possible small ascending aortic aneurysm which will need to be followed. At his last visit with Dr. Rollene Fare his Toprol was cut down to 25 mg alternating with 12.5 mg every 2 some bradycardia.   Carl Harper was recently seen by Dr. Percival Spanish in the office for shortness of breath and palpitations as well as exertional dyspnea. He wore the monitor however that was never performed as no monitors were available. He did undergo an exercise tolerance test for which he exercised for 6 minutes and 7 metabolic equivalents. There was no evidence of ischemia. He did have PVCs and a fairly flat blood pressure response to exercise. There was marked dyspnea with exertion. He also is reported recent tremors, some memory loss and instability with his gait.  [name] returns today and is complaining of a sinking spell his chest. When pressed further he felt like he was possibly presyncopal occasionally has trouble getting his breath. He has some occasional dizziness. He was evaluated for monitor however none was placed because there was not a monitor available. He did have his metoprolol increased to 12.5/25 mg every other day. He seems to think that this is helped his symptoms and feels better than he had when he saw Dr. Percival Spanish.  I saw Carl Harper back in the  office today. He says that he call and make an appointment about a month ago because his been feeling some episodes of chest discomfort in the morning. He's also felt somewhat short of breath. An EKG in the office today demonstrates new onset atrial flutter with ventricular bigeminy at a rate of 77. This is a new diagnosis for him. I spent greater than 10 minutes discussing atrial fibrillation and anticoagulation options with him. He understands his options and the need for trying to establish a sinus rhythm.  [name] returns today the office for follow-up. His EKG today fortunately shows sinus rhythm with PVCs. This is good news as we will not need to schedule him for a cardioversion. He has been taking Eliquis and feels that this is tolerable. He denies any bleeding problems. Here she does feel improvement in energy and I suspect this is from converting back to sinus rhythm at some point. Based on his TIA event, I suspect it may been related to paroxysmal atrial flutter. He did have carotid Dopplers which show very mild bilateral carotid disease and are not likely the source of his TIA event.  I saw Carl Harper back today in the office. He is maintaining sinus bradycardia with first degree AV block at a rate of 52. He denies any bleeding problems on Eliquis. He is on low-dose amiodarone. This was started by Dr. Curt Bears on 12/30/14, after monitor was placed which showed a very high burden of PVCs greater than 30% as well as A. fib. A repeat echo was performed which showed preserved LV  systolic function. Carl Harper was symptomatic with his PVCs. He reports a marked reduction in his symptoms after starting the amiodarone. I do not see these had baseline pulmonary function testing, thyroid or liver function tests.  11/09/2015  Carl Harper returns today for follow-up. He has had recent worsening shortness of breath and significant fatigue and leg weakness with exercise. This is of fairly recent finding.  He was seen by Dr. Curt Bears, who noted that recently [name] ophthalmologist had recommended discontinuing amiodarone due to corneal deposits. Since that time, he has been having a washout of the amiodarone with the plan to reassess his burden of PVCs by monitoring at the end of September. This is a ready been scheduled. His last echo was in 2016 and showed normal LV function, but he has had new symptoms as described above and would likely benefit from a repeat echo to rule out cardiomyopathy.  02/04/2016  Carl Harper returns today for follow-up. He has been seen by Dr. Curt Bears for evaluation of his a-fib. He was on amiodarone, but we discontinued it due to corneal deposits. He has maintained sinus rhythm without recurrent atrial fibrillation. PVCs were noted and noted to be significant and he was started on mexiletine. Since that he's had some improvement in his symptoms although was noted to have some PVCs today. He has a follow-up with Dr. Curt Bears in a few months. We repeated an echocardiogram which showed normal systolic and diastolic function with mild left atrial enlargement.  08/10/2016  Carl Harper returns today. He reports his palpitations have improved significantly with the addition of mexiletine. He is seen Dr. Curt Bears with cardiac electrophysiology for this. Blood pressure is well-controlled today 102/52. EKG which is personally reviewed shows sinus bradycardia with marked sinus arrhythmia and first-degree AV block at 53. QTC is 377 ms with a left anterior fascicular block and first-degree AV block.   02/05/2017  Carl Harper was seen today in follow-up.  Overall is without complaints.  He denies any significant palpitations, shortness of breath, chest pain or lower extremity claudication.  He is been successfully treated on mexiletine and although he continues to have some PVCs, the frequency and burden have decreased significantly.  Blood pressure is at goal.  EKG was personally  reviewed today which is sinus rhythm, first-degree AV block and occasional PVCs, left anterior fascicular block and QTC 413 ms.  09/11/2017  Mr. Noorani returns today for follow-up.  Again he is doing well without complaints.  He denies any palpitations or significant arrhythmias.  He denies any chest pain or worsening shortness of breath.  He occasionally gets some fatigue with working out in the hot weather for long periods of time.  This may be related to some low normal blood pressure.  He is using Lasix as needed.  Blood pressure today was 108/58 however he generally says that his blood pressures around 614 systolic.  11/28/2017  Mr. Oleski is seen today in follow-up.  He seems to be doing well.  He denies any recurrent atrial fibrillation.  He is tolerating Eliquis without bleeding issues.  Blood pressures well controlled today 121/60.  He denies any PVCs and he is compliant with mexiletine and metoprolol.  EKG shows sinus bradycardia with first-degree AV block, nonspecific IVCD with QTC of 411 ms.  His only other concern is he has some right leg pain.  He does have a history of PAD.  He says sometimes this is worse with exertion and improves at rest but at other  times he gets a pain in his right hip when laying in bed, which is more concerning for a bursitis or possibly osteoarthritis.  09/02/2018  Mr. Encarnacion is seen today for follow-up.  He he had to virtual visit since I last saw him in the office.  He had some chest pain and underwent a CT of the chest because of a dilated aorta.  This showed aortic ectasia measuring between 4.8 and 5 cm.  The previous 5 cm measurement was in 2001 however his most recent CT measurement was less significant.  There was no evidence of dissection.  I had increased his Imdur however he reduced it to 30 mg daily.  He notes that he is only taking sublingual nitro about once a week.  In general he is fairly comfortable.  His EKG incidentally did show atrial  fibrillation today which is rate controlled.  He is on mexiletine and Eliquis.  01/06/2019  Mr. Argabright returns for follow-up.  He is complaining of some fatigue and weakness.  He just had a repeat CT which showed stable aortic ectasia measuring 4.8 cm.  He had a 5 cm aorta noted in 2001 as well.  I suspect this will continue to be stable.  He does report some fatigue and leg weakness.  Blood pressure was noted to be low today.  He is also an bradycardic response with his A. Fib.  12/12/2019  Mr. Rogel is seen today for follow-up.  I did lower extremity arterial Dopplers to follow-up on leg weakness however this showed no evidence of arterial insufficiency with a patent.  Abdominal aortic imaging showed no evidence of aneurysm.  He does have a thoracic aneurysm which is stable by CT measuring between 4.8 and 4.9 cm.  We will continue to follow that with annual CTs.  He is to follow-up with his PCP regarding fatigue.  Blood pressure was low today at 96/50 however his wife reported that was unusual.  He is on very little medication to lower blood pressure.  He seems to be tolerating mexiletine and is in the sinus rhythm today with PACs.  05/06/2020  Mr. Nygren is seen in follow-up today.  He called the office and noted that he has had worsening lower extremity swelling.  He was recently cardioverted for atrial fibrillation.  He has maintained sinus rhythm and saw Roderic Palau, NP in the A. fib clinic.  He was given an increased dose of Lasix for 3 days without much improvement.  His wife increase his Lasix up to '40mg'$  daily however his swelling is only minimally improved.  He also reports persistent fatigue and some shortness of breath.  In general it sounds like he is fairly sedentary according to his wife he says that he sleeps most of the morning and is back in bed with his feet elevated in the afternoon.  06/27/2021  Mr. Tesch returns today for follow-up.  He was recently added on because of  an increase in chest discomfort.  He saw his primary care provider about a week ago and it was noted that he had used nitroglycerin sublingual more frequently now several times a week.  He says he often gets discomfort in his chest when he wakes up.  He takes it fairly quickly but does not allow the discomfort to potentially go away.  Is not clear if this is angina since it seems to always occur at rest.  He is on long-acting isosorbide.  He has been monitored long-term for ascending  aortic aneurysm.  His last CT actually showed somewhat of a smaller measurement and it seems that the aorta measures between 4.6 and 4.8 cm.  He said he will not have surgery for this therefore I think there is little utility to continue to monitor it especially at his age.  He has been seen by EP and after cardioversion has been maintaining sinus rhythm.  He had a high burden of PVCs in the past but more recently has not had issues with him.  EKG today shows sinus bradycardia with no ischemic changes.  12/29/2021  Mr. Inabinet returns today for follow-up.  Overall he seems to be doing well.  He has some complaints particularly in the morning with aches and pains but it does improve throughout the day.  No real chest pain complaints.  He had recent repeat vascular ultrasounds which are stable.  No stenosis of a prior stent and no significant arterial stenosis.  As this has been stable, I think we could probably follow this as needed.  He had an echo last year which showed normal systolic function but a dilated aorta up to 50 mm.  He says that he would not want surgery for this and therefore we have decided not to follow it.  He remains in A-fib/flutter.  He is on Eliquis.  Of note is also these been on aspirin for a number of years.  I am concerned about the long-term risks of bleeding on both I think we could go with Eliquis monotherapy.  PMHx:  Past Medical History:  Diagnosis Date   Benign neoplasm of colon 10/24/2006   BPH  (benign prostatic hyperplasia)    Daily headache    Diverticulosis 09/03.2008   Dysrhythmia    "irregular"   Hypertension    Internal hemorrhoids without mention of complication 75/64/3329   Memory loss    PAD (peripheral artery disease) (HCC)    Restless leg syndrome    Sleep apnea    dx'd; wore mask; lost weight; turned in machine"    Past Surgical History:  Procedure Laterality Date   APPENDECTOMY  ~ 1954   ATHERECTOMY Right 11/07/2011   Procedure: ATHERECTOMY;  Surgeon: Lorretta Harp, MD;  Location: [facility] CATH LAB;  Service: Cardiovascular;  Laterality: Right;   CARDIAC CATHETERIZATION  03/30/1999   non-critical CAD   CARDIOVERSION N/A 04/19/2020   Procedure: CARDIOVERSION;  Surgeon: Fay Records, MD;  Location: [facility] ENDOSCOPY;  Service: Cardiovascular;  Laterality: N/A;   CATARACT EXTRACTION W/ INTRAOCULAR LENS  IMPLANT, BILATERAL  2011-2012   ILIAC ARTERY STENT Right 11/07/2011   Diamondback orbital rotational atherectomy, PTA & stenting of calcified R CIA (Dr. Adora Fridge)   [employer] [employer] N/A 11/07/2011   Procedure: LOWER EXTREMITY ANGIOGRAM;  Surgeon: Lorretta Harp, MD;  Location: [facility] CATH LAB;  Service: Cardiovascular;  Laterality: N/A;   NM MYOCAR PERF WALL MOTION  09/2012   lexiscan myoview - low risk with fixed inferior defect with underlying bowel attenuation suggestive of artifact   PERCUTANEOUS STENT INTERVENTION  11/07/2011   Procedure: PERCUTANEOUS STENT INTERVENTION;  Surgeon: Lorretta Harp, MD;  Location: [facility] CATH LAB;  Service: Cardiovascular;;   SHOULDER ARTHROSCOPY W/ ROTATOR CUFF REPAIR Left 2010   Sleep Study  01/09/2011   AHI during total sleep 14.4/hr and during REM 19.4/hr   TRANSTHORACIC ECHOCARDIOGRAM  09/2012   EF 55-60%, LA mildly dilated    FAMHx:  Family History  Problem Relation Age of Onset   Heart Problems  Father 26   Cancer Brother 34   Heart failure Brother    Cancer Maternal Grandfather        prostate    SOCHx:   reports  that he quit smoking about 27 years ago. His smoking use included cigarettes. He has a 55.00 pack-year smoking history. He has never used smokeless tobacco. He reports current alcohol use of about 14.0 standard drinks of alcohol per week. He reports that he does not use drugs.  ALLERGIES:  Allergies  Allergen Reactions   Amiodarone Other (See Comments)    Other reaction(s): Other (See Comments) Corneal deposits Corneal deposits   Bee Venom Other (See Comments)    unknown    ROS: Pertinent items noted in HPI and remainder of comprehensive ROS otherwise negative.  HOME MEDS: Current Outpatient Medications  Medication Sig Dispense Refill   alendronate (FOSAMAX) 70 MG tablet Take 70 mg by mouth every Sunday.     apixaban (ELIQUIS) 5 MG TABS tablet Take 1 tablet (5 mg total) by mouth 2 (two) times daily. 60 tablet 5   aspirin 81 MG tablet Take 81 mg by mouth daily.     Calcium Citrate 250 MG TABS Take 1-2 tablets by mouth See admin instructions. 2 tabs twice daily, 1 tab at noon     Cholecalciferol (VITAMIN D3) 50 MCG (2000 UT) CAPS Take 2,000 Units by mouth daily.     cycloSPORINE (RESTASIS) 0.05 % ophthalmic emulsion Place one drop into both eyes 2 (two) times daily.     diclofenac Sodium (VOLTAREN) 1 % GEL SMARTSIG:2 Topical Twice Daily     finasteride (PROSCAR) 5 MG tablet Take 5 mg by mouth daily.     furosemide (LASIX) 20 MG tablet Take 2 tablets (40 mg total) by mouth 2 (two) times daily. 120 tablet 11   gabapentin (NEURONTIN) 100 MG capsule Take by mouth.     ipratropium (ATROVENT) 0.03 % nasal spray      isosorbide mononitrate (IMDUR) 30 MG 24 hr tablet Take 1 tablet (30 mg total) by mouth daily. 90 tablet 3   losartan (COZAAR) 25 MG tablet TAKE ONE TABLET BY MOUTH DAILY 90 tablet 3   LUTEIN PO Take 20 mg by mouth daily.     metoprolol succinate (TOPROL-XL) 25 MG 24 hr tablet TAKE 1/2 TABLET BY MOUTH DAILY 45 tablet 2   mexiletine (MEXITIL) 250 MG capsule      Multiple Vitamin  (MULTIVITAMIN WITH MINERALS) TABS tablet Take 1 tablet by mouth daily.     Multiple Vitamins-Minerals (PRESERVISION AREDS) CAPS Take 1 capsule by mouth in the morning and at bedtime.     nitroGLYCERIN (NITROSTAT) 0.4 MG SL tablet DISSOLVE 1 TAB UNDER TONGUE FOR CHEST PAIN - IF PAIN REMAINS AFTER 5 MIN, CALL 911 AND REPEAT DOSE. MAX 3 TABS IN 15 MINUTES 25 tablet 11   pramipexole (MIRAPEX) 0.75 MG tablet Take 0.75 mg by mouth daily.     RAPAFLO 8 MG CAPS capsule      solifenacin (VESICARE) 5 MG tablet Take 5 mg by mouth daily.     Tamsulosin HCl (FLOMAX) 0.4 MG CAPS Take 0.4 mg by mouth in the morning and at bedtime.     traZODone (DESYREL) 100 MG tablet Take 100 mg by mouth at bedtime.  3   Vitamin D, Ergocalciferol, (DRISDOL) 1.25 MG (50000 UNIT) CAPS capsule Take by mouth.     XIIDRA 5 % SOLN Place 5 % into both eyes 2 (two) times daily.  No current facility-administered medications for this visit.    LABS/IMAGING: No results found for this or any previous visit (from the past 48 hour(s)). No results found.  VITALS: BP 114/66   Pulse 68   Ht '5\' 8"'$  (1.727 m)   Wt 174 lb 3.2 oz (79 kg)   SpO2 97%   BMI 26.49 kg/m   EXAM: General appearance: alert and no distress Neck: no carotid bruit, no JVD and thyroid not enlarged, symmetric, no tenderness/mass/nodules Lungs: clear to auscultation bilaterally Heart: irregularly irregular rhythm Abdomen: soft, non-tender; bowel sounds normal; no masses,  no organomegaly Extremities: edema Trace ankle edema bilaterally Pulses: 2+ and symmetric Skin: Skin color, texture, turgor normal. No rashes or lesions Neurologic: Grossly normal Psych: Pleasant  EKG: Atrial flutter with 4 1 AV conduction at 68-personally reviewed  ASSESSMENT: Paroxysmal afib/flutter with frequent PVC's - CHADSVASC score of 4 on Eliquis- amiodarone discontinued due to corneal deposits PVC's -suppressed with mexilitine PAD status post diamondback were orbital  atherectomy and stenting of the right common iliac artery Hypertension Dyslipidemia Mild CAD Obstructive sleep apnea- not on CPAP DOE - negative treadmill stress test Tremor/memory loss/gait abnormalities - no evidence of Parkinson's by recent neurology consult Ascending aortic aneurysm measuring 4.8 cm (5.0 cm measurement in 2001) -plan discontinue surveillance as he does not want to consider aortic surgery.  PLAN: 1.   Mr. Dusek doing fairly well.  No cardiac sounding chest pain.  He is in a flutter today but is paroxysmal.  He has an ascending aortic aneurysm that has increased up to 5 cm in size.  We had not recommended any further follow-up imaging because he is not interested in aortic surgery.  I do not think we need to continue to follow lower extremity Dopplers because he has had stable findings and no symptoms of claudication.  As he is on combination aspirin and Eliquis, and concerned about bleeding risk.  We will go ahead and stop aspirin and maintain Eliquis monotherapy.  Follow-up with me in 6 months or sooner as necessary  Pixie Casino, MD, FACC, [place] Director of the [facility]  [facility] Attending Cardiologist  Direct Dial: [phone]  Fax: [phone]  Website:  [email or web address] 12/29/2021, 11:22 AM

## 2021-12-29 NOTE — Patient Instructions (Signed)
Medication Instructions:  NO CHANGES  *If you need a refill on your cardiac medications before your next appointment, please call your pharmacy*   Follow-Up: At [facility], you and your health needs are our priority.  As part of our continuing mission to provide you with exceptional heart care, we have created designated Provider Care Teams.  These Care Teams include your primary Cardiologist (physician) and Advanced Practice Providers (APPs -  Physician Assistants and Nurse Practitioners) who all work together to provide you with the care you need, when you need it.  We recommend signing up for the patient portal called "MyChart".  Sign up information is provided on this After Visit Summary.  MyChart is used to connect with patients for Virtual Visits (Telemedicine).  Patients are able to view lab/test results, encounter notes, upcoming appointments, etc.  Non-urgent messages can be sent to your provider as well.   To learn more about what you can do with MyChart, go to [email or web address].    Your next appointment:   6 month(s)  The format for your next appointment:   In Person  Provider:   Kenneth C Hilty, MD   

## 2022-03-30 ENCOUNTER — Encounter (HOSPITAL_COMMUNITY): Payer: Self-pay | Admitting: *Deleted

## 2022-05-31 ENCOUNTER — Other Ambulatory Visit (HOSPITAL_BASED_OUTPATIENT_CLINIC_OR_DEPARTMENT_OTHER): Payer: Self-pay | Admitting: Family Medicine

## 2022-05-31 ENCOUNTER — Ambulatory Visit (HOSPITAL_BASED_OUTPATIENT_CLINIC_OR_DEPARTMENT_OTHER)
Admission: RE | Admit: 2022-05-31 | Discharge: 2022-05-31 | Disposition: A | Discharge status: Home / Self Care | Payer: Medicare HMO | Source: Ambulatory Visit | Attending: Family Medicine | Admitting: Family Medicine

## 2022-05-31 DIAGNOSIS — R339 Retention of urine, unspecified: Secondary | ICD-10-CM

## 2022-05-31 DIAGNOSIS — R338 Other retention of urine: Secondary | ICD-10-CM | POA: Insufficient documentation

## 2022-05-31 DIAGNOSIS — N401 Enlarged prostate with lower urinary tract symptoms: Secondary | ICD-10-CM | POA: Insufficient documentation

## 2022-06-13 ENCOUNTER — Telehealth: Payer: Self-pay | Admitting: Internal Medicine

## 2022-06-13 NOTE — Telephone Encounter (Signed)
Spoke to Dr Corine Shelter    Dr Corine Shelter is requesting an appointment sooner for patient than 07/11/22   Dr Corine Shelter (  works for [employer] - home visit)   he states patient is having pitting edema, frequent chest pain , history of afib and  repaired aneurysm    Per  patient is taking Lasix 40 mg.  Appointment schedule for 06/14/22 at 11:30 am.   Aware to be at office at 11 am.

## 2022-06-13 NOTE — Telephone Encounter (Signed)
Dr. Corine Shelter is calling due to being with the patient for a in home visit. He reports significant CHF, 4+ pitting edema, chest pain, & orthostatic hypotension. Patient last used nitro yesterday & has a repaired aortic aneurysm. He is requesting to speak with a nurse to get the patient scheduled to come in for sooner then 05/21. Call transferred to triage.

## 2022-06-14 ENCOUNTER — Encounter: Payer: Self-pay | Admitting: Internal Medicine

## 2022-06-14 ENCOUNTER — Ambulatory Visit: Payer: Medicare HMO | Attending: Internal Medicine | Admitting: Internal Medicine

## 2022-06-14 VITALS — BP 98/58 | HR 71 | Ht 70.0 in | Wt 178.8 lb

## 2022-06-14 DIAGNOSIS — I951 Orthostatic hypotension: Secondary | ICD-10-CM | POA: Diagnosis not present

## 2022-06-14 MED ORDER — FUROSEMIDE 40 MG PO TABS: 40.0000 mg | ORAL_TABLET | Freq: Every day | ORAL | 3 refills | Status: DC | PRN

## 2022-06-14 NOTE — Progress Notes (Signed)
Cardiology Office Note:    Date:  06/14/2022   ID:  Carl Harper, DOB [birth year], MRN [id number]  PCP:  Tracey Harries, MD   [facility] Providers Cardiologist:  Chrystie Nose, MD     Referring MD: Tracey Harries, MD   No chief complaint on file. Low BP  History of Present Illness:    Carl Harper is a 88 y.o. male with a hx atrial flutter/fib, PAD, HTN,  here for DOD appointment   Dr. Rennis Golden patient. Per his documentation: He underwent diamondback orbital rotational atherectomy by Dr. Allyson Sabal in 2013 with stenting of the calcified right common iliac stenosis. He has mild nonobstructive coronary disease by cath in 2001 and a negative Myoview in 2011. He also has obstructive sleep apnea on CPAP has had reflux symptoms and atypical chest pain from time to time. His echocardiogram does show mild concentric LVH and borderline aortic root dilatation with a possible small ascending aortic aneurysm which will need to be followed. He's had issues with bradycardia and his toprol was decreased. He's had a SPECT in 2021 that was normal.  He has hx of atrial flutter. He was on amiodarone. His amio was recommended to stop by his ophthalmologist for corneal deposits. Developed high burden of PVCs. He has hx of afib. His ascending aortic aneurysm is being monitored ~ 4.8 cm. PVCs improved over time. He has frequent c/o dyspnea and CP. Maintained on imdur.  He comes in today, he had an insurance test and was told to come in. He was told to see a cardiologist. He has lower energy. He is easily fatigued. Not hydrating well. He is not taking metoprolol. He has LE that has been going on for months. It is relieved by raising his legs.  He notes getting dizzy. The other night, he got up and s fell in to the bed.   Past Medical History:  Diagnosis Date   Benign neoplasm of colon 10/24/2006   BPH (benign prostatic hyperplasia)    Daily headache    Diverticulosis 09/03.2008   Dysrhythmia     "irregular"   Hypertension    Internal hemorrhoids without mention of complication 10/24/2006   Memory loss    PAD (peripheral artery disease)    Restless leg syndrome    Sleep apnea    dx'd; wore mask; lost weight; turned in machine"    Past Surgical History:  Procedure Laterality Date   APPENDECTOMY  ~ 1954   ATHERECTOMY Right 11/07/2011   Procedure: ATHERECTOMY;  Surgeon: Runell Gess, MD;  Location: [facility] CATH LAB;  Service: Cardiovascular;  Laterality: Right;   CARDIAC CATHETERIZATION  03/30/1999   non-critical CAD   CARDIOVERSION N/A 04/19/2020   Procedure: CARDIOVERSION;  Surgeon: Pricilla Riffle, MD;  Location: [facility] ENDOSCOPY;  Service: Cardiovascular;  Laterality: N/A;   CATARACT EXTRACTION W/ INTRAOCULAR LENS  IMPLANT, BILATERAL  2011-2012   ILIAC ARTERY STENT Right 11/07/2011   Diamondback orbital rotational atherectomy, PTA & stenting of calcified R CIA (Dr. Erlene Quan)   LOWER EXTREMITY ANGIOGRAM N/A 11/07/2011   Procedure: LOWER EXTREMITY ANGIOGRAM;  Surgeon: Runell Gess, MD;  Location: [facility] CATH LAB;  Service: Cardiovascular;  Laterality: N/A;   NM MYOCAR PERF WALL MOTION  09/2012   lexiscan myoview - low risk with fixed inferior defect with underlying bowel attenuation suggestive of artifact   PERCUTANEOUS STENT INTERVENTION  11/07/2011   Procedure: PERCUTANEOUS STENT INTERVENTION;  Surgeon: Runell Gess, MD;  Location: [facility] CATH  LAB;  Service: Cardiovascular;;   SHOULDER ARTHROSCOPY W/ ROTATOR CUFF REPAIR Left 2010   Sleep Study  01/09/2011   AHI during total sleep 14.4/hr and during REM 19.4/hr   TRANSTHORACIC ECHOCARDIOGRAM  09/2012   EF 55-60%, LA mildly dilated    Current Medications: Current Outpatient Medications on File Prior to Visit  Medication Sig Dispense Refill   alendronate (FOSAMAX) 70 MG tablet Take 70 mg by mouth every Sunday.     apixaban (ELIQUIS) 5 MG TABS tablet Take 1 tablet (5 mg total) by mouth 2 (two) times daily. 60 tablet 5   aspirin 81 MG  tablet Take 81 mg by mouth daily.     Calcium Citrate 250 MG TABS Take 1-2 tablets by mouth See admin instructions. 2 tabs twice daily, 1 tab at noon     Cholecalciferol (VITAMIN D3) 50 MCG (2000 UT) CAPS Take 2,000 Units by mouth daily.     cycloSPORINE (RESTASIS) 0.05 % ophthalmic emulsion Place one drop into both eyes 2 (two) times daily.     diclofenac Sodium (VOLTAREN) 1 % GEL SMARTSIG:2 Topical Twice Daily     isosorbide mononitrate (IMDUR) 30 MG 24 hr tablet Take 1 tablet (30 mg total) by mouth daily. 90 tablet 3   losartan (COZAAR) 25 MG tablet TAKE ONE TABLET BY MOUTH DAILY 90 tablet 3   Multiple Vitamin (MULTIVITAMIN WITH MINERALS) TABS tablet Take 1 tablet by mouth daily.     Multiple Vitamins-Minerals (PRESERVISION AREDS) CAPS Take 1 capsule by mouth in the morning and at bedtime.     nitroGLYCERIN (NITROSTAT) 0.4 MG SL tablet DISSOLVE 1 TAB UNDER TONGUE FOR CHEST PAIN - IF PAIN REMAINS AFTER 5 MIN, CALL 911 AND REPEAT DOSE. MAX 3 TABS IN 15 MINUTES 25 tablet 11   pramipexole (MIRAPEX) 0.75 MG tablet Take 0.75 mg by mouth daily.     Tamsulosin HCl (FLOMAX) 0.4 MG CAPS Take 0.4 mg by mouth in the morning and at bedtime.     traZODone (DESYREL) 100 MG tablet Take 100 mg by mouth at bedtime.  3   Vitamin D, Ergocalciferol, (DRISDOL) 1.25 MG (50000 UNIT) CAPS capsule Take by mouth.     XIIDRA 5 % SOLN Place 5 % into both eyes 2 (two) times daily.     finasteride (PROSCAR) 5 MG tablet Take 5 mg by mouth daily.     gabapentin (NEURONTIN) 100 MG capsule Take by mouth.     ipratropium (ATROVENT) 0.03 % nasal spray  (Patient not taking: Reported on 06/14/2022)     LUTEIN PO Take 20 mg by mouth daily.     mexiletine (MEXITIL) 250 MG capsule      RAPAFLO 8 MG CAPS capsule      solifenacin (VESICARE) 5 MG tablet Take 5 mg by mouth daily.     No current facility-administered medications on file prior to visit.     Allergies:   Amiodarone and Bee venom   Social History   Socioeconomic  History   Marital status: Married    Spouse name: Not on file   Number of children: 0   Years of education: 13   Highest education level: Not on file  Occupational History   Occupation: retired    Associate Professor: [employer]  Tobacco Use   Smoking status: Former    Packs/day: 1.00    Years: 55.00    Additional pack years: 0.00    Total pack years: 55.00    Types: Cigarettes  Quit date: 06/24/1994    Years since quitting: 27.9   Smokeless tobacco: Never  Vaping Use   Vaping Use: Never used  Substance and Sexual Activity   Alcohol use: Yes    Alcohol/week: 14.0 standard drinks of alcohol    Types: 14 Glasses of wine per week    Comment: 11/07/2011 "couple drinks of wine q night"   Drug use: No   Sexual activity: Not Currently  Other Topics Concern   Not on file  Social History Narrative   Not on file   Social Determinants of Health   Financial Resource Strain: Not on file  Food Insecurity: Not on file  Transportation Needs: Not on file  Physical Activity: Not on file  Stress: Not on file  Social Connections: Not on file     Family History: The patient's family history includes Cancer in his maternal grandfather; Cancer (age of onset: 64) in his brother; Heart Problems (age of onset: 57) in his father; Heart failure in his brother.  ROS:   Please see the history of present illness.     All other systems reviewed and are negative.  EKGs/Labs/Other Studies Reviewed:    The following studies were reviewed today:   EKG:  EKG is  ordered today.  The ekg ordered today demonstrates   06/14/2022: typical atrial flutter with 4:1 AV conduction  Recent Labs: No results found for requested labs within last 365 days.  Recent Lipid Panel    Component Value Date/Time   CHOL 189 04/11/2013 1058   TRIG 59 04/11/2013 1058   HDL 77 04/11/2013 1058   LDLCALC 100 (H) 04/11/2013 1058     Risk Assessment/Calculations:    CHA2DS2-VASc Score = 4   This indicates a 4.8% annual  risk of stroke. The patient's score is based upon: CHF History: 0 HTN History: 1 Diabetes History: 0 Stroke History: 0 Vascular Disease History: 1 Age Score: 2 Gender Score: 0               Physical Exam:    VS:  Vitals:   06/14/22 1200  BP: (!) 98/58  Pulse: 71  SpO2: 96%    Wt Readings from Last 3 Encounters:  06/14/22 178 lb 12.8 oz (81.1 kg)  12/29/21 174 lb 3.2 oz (79 kg)  06/27/21 177 lb (80.3 kg)     GEN:  Well nourished, well developed in no acute distress HEENT: Normal NECK: No JVD; No carotid bruits CARDIAC: RRR, no murmurs, rubs, gallops RESPIRATORY:  Clear to auscultation without rales, wheezing or rhonchi  ABDOMEN: Soft, non-tender, non-distended MUSCULOSKELETAL:  No edema; No deformity  SKIN: Warm and dry NEUROLOGIC:  Alert and oriented x 3 PSYCHIATRIC:  Normal affect   ASSESSMENT:   Atrial Flutter: rate controlled off BB. Cannot tolerate amiodarone 2/2 ocular side effects. He is otherwise tolerating this, no changes today. Can continue eliquis 5 mg daily.  Possible Autonomic Dysfunction: He notes getting LH with standing and falling. This is c/f AD. He is off metop which is fine with lower Bps in the high 90s., Will plan to changes his lasix 40 mg BID to PRN they have been taking it this way. He will take his Bps at home, if continuously low and continues to have falls, can consider midodrine  Lymphadema: recommended continuing compression stockings. Recommended some walking  PLAN:    In order of problems listed above:  Stop BB, Lasix PRN Follow up with Dr. Rennis Golden  Medication Adjustments/Labs and Tests Ordered:  Current medicines are reviewed at length with the patient today.  Concerns regarding medicines are outlined above.  No orders of the defined types were placed in this encounter.  Meds ordered this encounter  Medications   furosemide (LASIX) 40 MG tablet    Sig: Take 1 tablet (40 mg total) by mouth daily as needed for edema.     Dispense:  45 tablet    Refill:  3    Patient Instructions  Medication Instructions:  Your physician has recommended you make the following change in your medication:  STOP: Metoprolol  CHANGE: Lasix 40 mg daily AS NEEDED *If you need a refill on your cardiac medications before your next appointment, please call your pharmacy*   Lab Work: None If you have labs (blood work) drawn today and your tests are completely normal, you will receive your results only by: MyChart Message (if you have MyChart) OR A paper copy in the mail If you have any lab test that is abnormal or we need to change your treatment, we will call you to review the results.   Testing/Procedures: None   Follow-Up: At [facility], you and your health needs are our priority.  As part of our continuing mission to provide you with exceptional heart care, we have created designated Provider Care Teams.  These Care Teams include your primary Cardiologist (physician) and Advanced Practice Providers (APPs -  Physician Assistants and Nurse Practitioners) who all work together to provide you with the care you need, when you need it.   Your next appointment:   3 months  Provider:   Chrystie Nose, MD   Signed, Maisie Fus, MD  06/14/2022 12:32 PM    [facility]

## 2022-06-14 NOTE — Addendum Note (Signed)
Addended by: Jethro Bolus A on: 06/14/2022 01:44 PM   Modules accepted: Orders

## 2022-06-14 NOTE — Patient Instructions (Signed)
Medication Instructions:  Your physician has recommended you make the following change in your medication:  STOP: Metoprolol  CHANGE: Lasix 40 mg daily AS NEEDED *If you need a refill on your cardiac medications before your next appointment, please call your pharmacy*   Lab Work: None If you have labs (blood work) drawn today and your tests are completely normal, you will receive your results only by: MyChart Message (if you have MyChart) OR A paper copy in the mail If you have any lab test that is abnormal or we need to change your treatment, we will call you to review the results.   Testing/Procedures: None   Follow-Up: At [facility], you and your health needs are our priority.  As part of our continuing mission to provide you with exceptional heart care, we have created designated Provider Care Teams.  These Care Teams include your primary Cardiologist (physician) and Advanced Practice Providers (APPs -  Physician Assistants and Nurse Practitioners) who all work together to provide you with the care you need, when you need it.   Your next appointment:   3 months  Provider:   Chrystie Nose, MD

## 2022-06-16 NOTE — Addendum Note (Signed)
Addended by: Jethro Bolus A on: 06/16/2022 08:55 AM   Modules accepted: Orders

## 2022-07-11 ENCOUNTER — Encounter: Payer: Self-pay | Admitting: Internal Medicine

## 2022-07-11 ENCOUNTER — Ambulatory Visit: Payer: Medicare HMO | Attending: Internal Medicine | Admitting: Internal Medicine

## 2022-07-11 VITALS — BP 118/60 | HR 56 | Ht 70.0 in | Wt 181.4 lb

## 2022-07-11 DIAGNOSIS — I480 Paroxysmal atrial fibrillation: Secondary | ICD-10-CM

## 2022-07-11 DIAGNOSIS — R600 Localized edema: Secondary | ICD-10-CM

## 2022-07-11 DIAGNOSIS — I951 Orthostatic hypotension: Secondary | ICD-10-CM | POA: Diagnosis not present

## 2022-07-11 DIAGNOSIS — I712 Thoracic aortic aneurysm, without rupture, unspecified: Secondary | ICD-10-CM | POA: Diagnosis not present

## 2022-07-11 MED ORDER — FUROSEMIDE 40 MG PO TABS: 40.0000 mg | ORAL_TABLET | Freq: Two times a day (BID) | ORAL | 3 refills | Status: DC

## 2022-07-11 NOTE — Patient Instructions (Signed)
Medication Instructions:  INCREASE furosemide (lasix) to 40mg  twice daily  DECREASE tamsulosin (flomax) to 0.4mg  once daily  STOP isosorbide   *If you need a refill on your cardiac medications before your next appointment, please call your pharmacy*   Lab Work: Non-Fasting BMET, BNP in 1 week (lab is closed on Monday 5/27)  If you have labs (blood work) drawn today and your tests are completely normal, you will receive your results only by: MyChart Message (if you have MyChart) OR A paper copy in the mail If you have any lab test that is abnormal or we need to change your treatment, we will call you to review the results.   Follow-Up: At [facility], you and your health needs are our priority.  As part of our continuing mission to provide you with exceptional heart care, we have created designated Provider Care Teams.  These Care Teams include your primary Cardiologist (physician) and Advanced Practice Providers (APPs -  Physician Assistants and Nurse Practitioners) who all work together to provide you with the care you need, when you need it.  We recommend signing up for the patient portal called "MyChart".  Sign up information is provided on this After Visit Summary.  MyChart is used to connect with patients for Virtual Visits (Telemedicine).  Patients are able to view lab/test results, encounter notes, upcoming appointments, etc.  Non-urgent messages can be sent to your provider as well.   To learn more about what you can do with MyChart, go to [email or web address].    Your next appointment:    September 14, 2022 with Dr. Rennis Golden

## 2022-07-11 NOTE — Progress Notes (Signed)
OFFICE NOTE  Chief Complaint:  No complaints  Primary Care Physician: Tracey Harries, MD  HPI:  Carl Harper is a pleasant 88 year old male previously followed Dr. Alanda Amass with a history of peripheral arterial disease. He underwent diamondback orbital rotational atherectomy by Dr. Allyson Sabal in 2013 with stenting of the calcified right common iliac stenosis. He has mild nonobstructive coronary disease by cath in 2001 and a negative Myoview in 2011. He also has obstructive sleep apnea on CPAP has had reflux symptoms and atypical chest pain from time to time.  His echocardiogram does show mild concentric LVH and borderline aortic root dilatation with a possible small ascending aortic aneurysm which will need to be followed. At his last visit with Dr. Alanda Amass his Toprol was cut down to 25 mg alternating with 12.5 mg every 2 some bradycardia.   Carl Harper was recently seen by Dr. Antoine Poche in the office for shortness of breath and palpitations as well as exertional dyspnea. He wore the monitor however that was never performed as no monitors were available. He did undergo an exercise tolerance test for which he exercised for 6 minutes and 7 metabolic equivalents. There was no evidence of ischemia. He did have PVCs and a fairly flat blood pressure response to exercise. There was marked dyspnea with exertion. He also is reported recent tremors, some memory loss and instability with his gait.  Carl Harper returns today and is complaining of a sinking spell his chest. When pressed further he felt like he was possibly presyncopal occasionally has trouble getting his breath. He has some occasional dizziness. He was evaluated for monitor however none was placed because there was not a monitor available. He did have his metoprolol increased to 12.5/25 mg every other day. He seems to think that this is helped his symptoms and feels better than he had when he saw Dr. Antoine Poche.  I saw Carl Harper back in the  office today. He says that he call and make an appointment about a month ago because his been feeling some episodes of chest discomfort in the morning. He's also felt somewhat short of breath. An EKG in the office today demonstrates new onset atrial flutter with ventricular bigeminy at a rate of 77. This is a new diagnosis for him. I spent greater than 10 minutes discussing atrial fibrillation and anticoagulation options with him. He understands his options and the need for trying to establish a sinus rhythm.  Carl Harper returns today the office for follow-up. His EKG today fortunately shows sinus rhythm with PVCs. This is good news as we will not need to schedule him for a cardioversion. He has been taking Eliquis and feels that this is tolerable. He denies any bleeding problems. Here she does feel improvement in energy and I suspect this is from converting back to sinus rhythm at some point. Based on his TIA event, I suspect it may been related to paroxysmal atrial flutter. He did have carotid Dopplers which show very mild bilateral carotid disease and are not likely the source of his TIA event.  I saw Carl Harper back today in the office. He is maintaining sinus bradycardia with first degree AV block at a rate of 52. He denies any bleeding problems on Eliquis. He is on low-dose amiodarone. This was started by Dr. Elberta Fortis on 12/30/14, after monitor was placed which showed a very high burden of PVCs greater than 30% as well as A. fib. A repeat echo was performed which showed preserved LV  systolic function. Carl Harper was symptomatic with his PVCs. He reports a marked reduction in his symptoms after starting the amiodarone. I do not see these had baseline pulmonary function testing, thyroid or liver function tests.  11/09/2015  Carl Harper returns today for follow-up. He has had recent worsening shortness of breath and significant fatigue and leg weakness with exercise. This is of fairly recent finding.  He was seen by Dr. Elberta Fortis, who noted that recently Mr. Denner ophthalmologist had recommended discontinuing amiodarone due to corneal deposits. Since that time, he has been having a washout of the amiodarone with the plan to reassess his burden of PVCs by monitoring at the end of September. This is a ready been scheduled. His last echo was in 2016 and showed normal LV function, but he has had new symptoms as described above and would likely benefit from a repeat echo to rule out cardiomyopathy.  02/04/2016  Carl Harper returns today for follow-up. He has been seen by Dr. Elberta Fortis for evaluation of his a-fib. He was on amiodarone, but we discontinued it due to corneal deposits. He has maintained sinus rhythm without recurrent atrial fibrillation. PVCs were noted and noted to be significant and he was started on mexiletine. Since that he's had some improvement in his symptoms although was noted to have some PVCs today. He has a follow-up with Dr. Elberta Fortis in a few months. We repeated an echocardiogram which showed normal systolic and diastolic function with mild left atrial enlargement.  08/10/2016  Carl Harper returns today. He reports his palpitations have improved significantly with the addition of mexiletine. He is seen Dr. Elberta Fortis with cardiac electrophysiology for this. Blood pressure is well-controlled today 102/52. EKG which is personally reviewed shows sinus bradycardia with marked sinus arrhythmia and first-degree AV block at 53. QTC is 377 ms with a left anterior fascicular block and first-degree AV block.   02/05/2017  Carl Harper was seen today in follow-up.  Overall is without complaints.  He denies any significant palpitations, shortness of breath, chest pain or lower extremity claudication.  He is been successfully treated on mexiletine and although he continues to have some PVCs, the frequency and burden have decreased significantly.  Blood pressure is at goal.  EKG was personally  reviewed today which is sinus rhythm, first-degree AV block and occasional PVCs, left anterior fascicular block and QTC 413 ms.  09/11/2017  Carl Harper returns today for follow-up.  Again he is doing well without complaints.  He denies any palpitations or significant arrhythmias.  He denies any chest pain or worsening shortness of breath.  He occasionally gets some fatigue with working out in the hot weather for long periods of time.  This may be related to some low normal blood pressure.  He is using Lasix as needed.  Blood pressure today was 108/58 however he generally says that his blood pressures around 120 systolic.  11/28/2017  Carl Harper is seen today in follow-up.  He seems to be doing well.  He denies any recurrent atrial fibrillation.  He is tolerating Eliquis without bleeding issues.  Blood pressures well controlled today 121/60.  He denies any PVCs and he is compliant with mexiletine and metoprolol.  EKG shows sinus bradycardia with first-degree AV block, nonspecific IVCD with QTC of 411 ms.  His only other concern is he has some right leg pain.  He does have a history of PAD.  He says sometimes this is worse with exertion and improves at rest but at other  times he gets a pain in his right hip when laying in bed, which is more concerning for a bursitis or possibly osteoarthritis.  09/02/2018  Carl Harper is seen today for follow-up.  He he had to virtual visit since I last saw him in the office.  He had some chest pain and underwent a CT of the chest because of a dilated aorta.  This showed aortic ectasia measuring between 4.8 and 5 cm.  The previous 5 cm measurement was in 2001 however his most recent CT measurement was less significant.  There was no evidence of dissection.  I had increased his Imdur however he reduced it to 30 mg daily.  He notes that he is only taking sublingual nitro about once a week.  In general he is fairly comfortable.  His EKG incidentally did show atrial  fibrillation today which is rate controlled.  He is on mexiletine and Eliquis.  01/06/2019  Carl Harper returns for follow-up.  He is complaining of some fatigue and weakness.  He just had a repeat CT which showed stable aortic ectasia measuring 4.8 cm.  He had a 5 cm aorta noted in 2001 as well.  I suspect this will continue to be stable.  He does report some fatigue and leg weakness.  Blood pressure was noted to be low today.  He is also an bradycardic response with his A. Fib.  12/12/2019  Carl Harper is seen today for follow-up.  I did lower extremity arterial Dopplers to follow-up on leg weakness however this showed no evidence of arterial insufficiency with a patent.  Abdominal aortic imaging showed no evidence of aneurysm.  He does have a thoracic aneurysm which is stable by CT measuring between 4.8 and 4.9 cm.  We will continue to follow that with annual CTs.  He is to follow-up with his PCP regarding fatigue.  Blood pressure was low today at 96/50 however his wife reported that was unusual.  He is on very little medication to lower blood pressure.  He seems to be tolerating mexiletine and is in the sinus rhythm today with PACs.  05/06/2020  Carl Harper is seen in follow-up today.  He called the office and noted that he has had worsening lower extremity swelling.  He was recently cardioverted for atrial fibrillation.  He has maintained sinus rhythm and saw Rudi Coco, NP in the A. fib clinic.  He was given an increased dose of Lasix for 3 days without much improvement.  His wife increase his Lasix up to 40mg  daily however his swelling is only minimally improved.  He also reports persistent fatigue and some shortness of breath.  In general it sounds like he is fairly sedentary according to his wife he says that he sleeps most of the morning and is back in bed with his feet elevated in the afternoon.  06/27/2021  Carl Harper returns today for follow-up.  He was recently added on because of  an increase in chest discomfort.  He saw his primary care provider about a week ago and it was noted that he had used nitroglycerin sublingual more frequently now several times a week.  He says he often gets discomfort in his chest when he wakes up.  He takes it fairly quickly but does not allow the discomfort to potentially go away.  Is not clear if this is angina since it seems to always occur at rest.  He is on long-acting isosorbide.  He has been monitored long-term for ascending  aortic aneurysm.  His last CT actually showed somewhat of a smaller measurement and it seems that the aorta measures between 4.6 and 4.8 cm.  He said he will not have surgery for this therefore I think there is little utility to continue to monitor it especially at his age.  He has been seen by EP and after cardioversion has been maintaining sinus rhythm.  He had a high burden of PVCs in the past but more recently has not had issues with him.  EKG today shows sinus bradycardia with no ischemic changes.  12/29/2021  Mr. Goeller returns today for follow-up.  Overall he seems to be doing well.  He has some complaints particularly in the morning with aches and pains but it does improve throughout the day.  No real chest pain complaints.  He had recent repeat vascular ultrasounds which are stable.  No stenosis of a prior stent and no significant arterial stenosis.  As this has been stable, I think we could probably follow this as needed.  He had an echo last year which showed normal systolic function but a dilated aorta up to 50 mm.  He says that he would not want surgery for this and therefore we have decided not to follow it.  He remains in A-fib/flutter.  He is on Eliquis.  Of note is also these been on aspirin for a number of years.  I am concerned about the long-term risks of bleeding on both I think we could go with Eliquis monotherapy.  07/11/2022  Mr. Kump is seen today in follow-up.  He is concerned that he has been  declining recently.  He has had worsening lower extremity edema.  He was seen by my partner Dr. Wyline Mood in April.  She had noted that he was orthostatic and recommended being off of beta-blocker.  He is diuretic was started at 40 mg daily.  He says he has been urinating a lot but has not really lost a lot of fluid weight.  He still has issues with dizziness and has been having some falls including having a fall hitting a glass table the other day.  Fortunately he had no bleeding with it.  I did review his med list.  There are several meds he takes which could be contributing to orthostasis.  PMHx:  Past Medical History:  Diagnosis Date   Benign neoplasm of colon 10/24/2006   BPH (benign prostatic hyperplasia)    Daily headache    Diverticulosis 09/03.2008   Dysrhythmia    "irregular"   Hypertension    Internal hemorrhoids without mention of complication 10/24/2006   Memory loss    PAD (peripheral artery disease) (HCC)    Restless leg syndrome    Sleep apnea    dx'd; wore mask; lost weight; turned in machine"    Past Surgical History:  Procedure Laterality Date   APPENDECTOMY  ~ 1954   ATHERECTOMY Right 11/07/2011   Procedure: ATHERECTOMY;  Surgeon: Runell Gess, MD;  Location[facility] CATH LAB;  Service: Cardiovascular;  Laterality: Right;   CARDIAC CATHETERIZATION  03/30/1999   non-critical CAD   CARDIOVERSION N/A 04/19/2020   Procedure: CARDIOVERSION;  Surgeon: Pricilla Riffle, MD;  Location: [facility] ENDOSCOPY;  Service: Cardiovascular;  Laterality: N/A;   CATARACT EXTRACTION W/ INTRAOCULAR LENS  IMPLANT, BILATERAL  2011-2012   ILIAC ARTERY STENT Right 11/07/2011   Diamondback orbital rotational atherectomy, PTA & stenting of calcified R CIA (Dr. Erlene Quan)   LOWER EXTREMITY ANGIOGRAM N/A 11/07/2011  Procedure: LOWER EXTREMITY ANGIOGRAM;  Surgeon: Runell Gess, MD;  Location: [facility] CATH LAB;  Service: Cardiovascular;  Laterality: N/A;   NM MYOCAR PERF WALL MOTION  09/2012   lexiscan myoview -  low risk with fixed inferior defect with underlying bowel attenuation suggestive of artifact   PERCUTANEOUS STENT INTERVENTION  11/07/2011   Procedure: PERCUTANEOUS STENT INTERVENTION;  Surgeon: Runell Gess, MD;  Location: [facility] CATH LAB;  Service: Cardiovascular;;   SHOULDER ARTHROSCOPY W/ ROTATOR CUFF REPAIR Left 2010   Sleep Study  01/09/2011   AHI during total sleep 14.4/hr and during REM 19.4/hr   TRANSTHORACIC ECHOCARDIOGRAM  09/2012   EF 55-60%, LA mildly dilated    FAMHx:  Family History  Problem Relation Age of Onset   Heart Problems Father 62   Cancer Brother 63   Heart failure Brother    Cancer Maternal Grandfather        prostate    SOCHx:   reports that he quit smoking about 28 years ago. His smoking use included cigarettes. He has a 55.00 pack-year smoking history. He has never used smokeless tobacco. He reports current alcohol use of about 14.0 standard drinks of alcohol per week. He reports that he does not use drugs.  ALLERGIES:  Allergies  Allergen Reactions   Amiodarone Other (See Comments)    Other reaction(s): Other (See Comments) Corneal deposits Corneal deposits   Bee Venom Other (See Comments)    unknown    ROS: Pertinent items noted in HPI and remainder of comprehensive ROS otherwise negative.  HOME MEDS: Current Outpatient Medications  Medication Sig Dispense Refill   alendronate (FOSAMAX) 70 MG tablet Take 70 mg by mouth every Sunday.     apixaban (ELIQUIS) 5 MG TABS tablet Take 1 tablet (5 mg total) by mouth 2 (two) times daily. 60 tablet 5   aspirin 81 MG tablet Take 81 mg by mouth daily.     Calcium Citrate 250 MG TABS Take 1-2 tablets by mouth See admin instructions. 2 tabs twice daily, 1 tab at noon     Cholecalciferol (VITAMIN D3) 50 MCG (2000 UT) CAPS Take 2,000 Units by mouth daily.     cycloSPORINE (RESTASIS) 0.05 % ophthalmic emulsion Place one drop into both eyes 2 (two) times daily.     diclofenac Sodium (VOLTAREN) 1 % GEL  SMARTSIG:2 Topical Twice Daily     furosemide (LASIX) 40 MG tablet Take 1 tablet (40 mg total) by mouth daily as needed for edema. 45 tablet 3   ipratropium (ATROVENT) 0.03 % nasal spray      isosorbide mononitrate (IMDUR) 30 MG 24 hr tablet Take 1 tablet (30 mg total) by mouth daily. 90 tablet 3   losartan (COZAAR) 25 MG tablet TAKE ONE TABLET BY MOUTH DAILY 90 tablet 3   Multiple Vitamin (MULTIVITAMIN WITH MINERALS) TABS tablet Take 1 tablet by mouth daily.     Multiple Vitamins-Minerals (PRESERVISION AREDS) CAPS Take 1 capsule by mouth in the morning and at bedtime.     nitroGLYCERIN (NITROSTAT) 0.4 MG SL tablet DISSOLVE 1 TAB UNDER TONGUE FOR CHEST PAIN - IF PAIN REMAINS AFTER 5 MIN, CALL 911 AND REPEAT DOSE. MAX 3 TABS IN 15 MINUTES 25 tablet 11   pramipexole (MIRAPEX) 0.75 MG tablet Take 0.75 mg by mouth daily.     Tamsulosin HCl (FLOMAX) 0.4 MG CAPS Take 0.4 mg by mouth in the morning and at bedtime.     traZODone (DESYREL) 100 MG tablet Take 100 mg  by mouth at bedtime.  3   Vitamin D, Ergocalciferol, (DRISDOL) 1.25 MG (50000 UNIT) CAPS capsule Take by mouth.     XIIDRA 5 % SOLN Place 5 % into both eyes 2 (two) times daily.     No current facility-administered medications for this visit.    LABS/IMAGING: No results found for this or any previous visit (from the past 48 hour(s)). No results found.  VITALS: BP 118/60   Pulse (!) 56   Ht 5\' 10"  (1.778 m)   Wt 181 lb 6.4 oz (82.3 kg)   SpO2 95%   BMI 26.03 kg/m   EXAM: General appearance: alert and no distress Neck: no carotid bruit, no JVD and thyroid not enlarged, symmetric, no tenderness/mass/nodules Lungs: clear to auscultation bilaterally Heart: irregularly irregular rhythm Abdomen: soft, non-tender; bowel sounds normal; no masses,  no organomegaly Extremities: edema 3+ bilateral pitting LE Pulses: 2+ and symmetric Skin: Skin color, texture, turgor normal. No rashes or lesions Neurologic: Grossly normal Psych:  Pleasant  EKG: Deferred  ASSESSMENT: Paroxysmal afib/flutter with frequent PVC's - CHADSVASC score of 4 on Eliquis- amiodarone discontinued due to corneal deposits PVC's -suppressed with mexilitine PAD status post diamondback were orbital atherectomy and stenting of the right common iliac artery Hypertension Dyslipidemia Mild CAD Obstructive sleep apnea- not on CPAP DOE - negative treadmill stress test Tremor/memory loss/gait abnormalities - no evidence of Parkinson's by recent neurology consult Ascending aortic aneurysm measuring 4.8 cm (5.0 cm measurement in 2001) -plan discontinue surveillance as he does not want to consider aortic surgery.  PLAN: 1.   Mr. Badia has had issues with worsening lower extremity edema and orthostasis.  On review of his med list, he is on 0.4 mg tamsulosin twice a day.  I advised him to cut that in half and only take 0.4 mg at night.  I will also stop his nitrate which could also contribute to orthostasis.  He will need more diuresis.  Response to Lasix has been suboptimal and I advise increasing that to 40 mg twice daily.  Will get a metabolic profile and BNP in about 2 weeks.  He will need early follow-up to see if there has been any improvement.  Chrystie Nose, MD, [facility], FACP  [place]  [facility]  Medical Director of the Advanced Lipid Disorders &  Cardiovascular Risk Reduction Clinic Attending Cardiologist  Direct Dial: [phone]  Fax: [phone]  Website:  [email or web address] 07/11/2022, 11:30 AM

## 2022-07-21 LAB — BASIC METABOLIC PANEL
BUN/Creatinine Ratio: 26 — ABNORMAL HIGH (ref 10–24)
BUN: 39 mg/dL — ABNORMAL HIGH (ref 8–27)
CO2: 23 mmol/L (ref 20–29)
Calcium: 9.3 mg/dL (ref 8.6–10.2)
Chloride: 102 mmol/L (ref 96–106)
Creatinine, Ser: 1.52 mg/dL — ABNORMAL HIGH (ref 0.76–1.27)
Glucose: 100 mg/dL — ABNORMAL HIGH (ref 70–99)
Potassium: 3.9 mmol/L (ref 3.5–5.2)
Sodium: 140 mmol/L (ref 134–144)
eGFR: 44 mL/min/{1.73_m2} — ABNORMAL LOW (ref >59)

## 2022-07-21 LAB — BRAIN NATRIURETIC PEPTIDE: BNP: 89.7 pg/mL (ref 0.0–100.0)

## 2022-08-11 ENCOUNTER — Encounter: Payer: Self-pay | Admitting: Internal Medicine

## 2022-09-06 ENCOUNTER — Telehealth: Payer: Self-pay | Admitting: Internal Medicine

## 2022-09-06 MED ORDER — NITROGLYCERIN 0.4 MG SL SUBL: 0.4000 mg | SUBLINGUAL_TABLET | SUBLINGUAL | 2 refills | Status: DC | PRN

## 2022-09-06 NOTE — Telephone Encounter (Signed)
*  STAT* If patient is at the pharmacy, call can be transferred to refill team.   1. Which medications need to be refilled? (please list name of each medication and dose if known)   nitroGLYCERIN (NITROSTAT) 0.4 MG SL tablet    2. Which pharmacy/location (including street and city if local pharmacy) is medication to be sent to?  [facility] [address] [place], [place] - [address] AT [facility] [address]      3. Do they need a 30 day or 90 day supply? 90 day    Pt is completely out of medication

## 2022-09-06 NOTE — Telephone Encounter (Signed)
Pt's medication was sent to pt's pharmacy as requested. Confirmation received.  °

## 2022-09-14 ENCOUNTER — Ambulatory Visit: Payer: Medicare HMO | Attending: Internal Medicine | Admitting: Internal Medicine

## 2022-09-14 ENCOUNTER — Encounter: Payer: Self-pay | Admitting: Internal Medicine

## 2022-09-14 VITALS — BP 124/70 | HR 70 | Ht 69.0 in | Wt 181.8 lb

## 2022-09-14 DIAGNOSIS — I2510 Atherosclerotic heart disease of native coronary artery without angina pectoris: Secondary | ICD-10-CM

## 2022-09-14 DIAGNOSIS — R600 Localized edema: Secondary | ICD-10-CM

## 2022-09-14 DIAGNOSIS — I951 Orthostatic hypotension: Secondary | ICD-10-CM

## 2022-09-14 DIAGNOSIS — I480 Paroxysmal atrial fibrillation: Secondary | ICD-10-CM

## 2022-09-14 DIAGNOSIS — I4892 Unspecified atrial flutter: Secondary | ICD-10-CM

## 2022-09-14 DIAGNOSIS — R002 Palpitations: Secondary | ICD-10-CM | POA: Diagnosis not present

## 2022-09-14 MED ORDER — NITROGLYCERIN 0.4 MG SL SUBL
0.4000 mg | SUBLINGUAL_TABLET | SUBLINGUAL | 2 refills | Status: DC | PRN
Start: 2022-09-14 — End: 2023-04-06

## 2022-09-14 NOTE — Patient Instructions (Signed)
Medication Instructions:  No Changes *If you need a refill on your cardiac medications before your next appointment, please call your pharmacy*   Lab Work: No Labs If you have labs (blood work) drawn today and your tests are completely normal, you will receive your results only by: [place] (if you have MyChart) OR A paper copy in the mail If you have any lab test that is abnormal or we need to change your treatment, we will call you to review the results.   Testing/Procedures: No Testing   Follow-Up: At [facility], you and your health needs are our priority.  As part of our continuing mission to provide you with exceptional heart care, we have created designated Provider Care Teams.  These Care Teams include your primary Cardiologist (physician) and Advanced Practice Providers (APPs -  Physician Assistants and Nurse Practitioners) who all work together to provide you with the care you need, when you need it.  We recommend signing up for the patient portal called "MyChart".  Sign up information is provided on this After Visit Summary.  MyChart is used to connect with patients for Virtual Visits (Telemedicine).  Patients are able to view lab/test results, encounter notes, upcoming appointments, etc.  Non-urgent messages can be sent to your provider as well.   To learn more about what you can do with MyChart, go to [email or web address].    Your next appointment:   6 month(s)  Provider:   Pixie Casino, MD

## 2022-09-14 NOTE — Progress Notes (Signed)
OFFICE NOTE  Chief Complaint:  No complaints  Primary Care Physician: Tracey Harries, MD  HPI:  Carl Harper is a pleasant 88 year old male previously followed Dr. Alanda Amass with a history of peripheral arterial disease. He underwent diamondback orbital rotational atherectomy by Dr. Allyson Sabal in 2013 with stenting of the calcified right common iliac stenosis. He has mild nonobstructive coronary disease by cath in 2001 and a negative Myoview in 2011. He also has obstructive sleep apnea on CPAP has had reflux symptoms and atypical chest pain from time to time.  His echocardiogram does show mild concentric LVH and borderline aortic root dilatation with a possible small ascending aortic aneurysm which will need to be followed. At his last visit with Dr. Alanda Amass his Toprol was cut down to 25 mg alternating with 12.5 mg every 2 some bradycardia.   Carl Harper was recently seen by Dr. Antoine Poche in the office for shortness of breath and palpitations as well as exertional dyspnea. He wore the monitor however that was never performed as no monitors were available. He did undergo an exercise tolerance test for which he exercised for 6 minutes and 7 metabolic equivalents. There was no evidence of ischemia. He did have PVCs and a fairly flat blood pressure response to exercise. There was marked dyspnea with exertion. He also is reported recent tremors, some memory loss and instability with his gait.  Carl Harper returns today and is complaining of a sinking spell his chest. When pressed further he felt like he was possibly presyncopal occasionally has trouble getting his breath. He has some occasional dizziness. He was evaluated for monitor however none was placed because there was not a monitor available. He did have his metoprolol increased to 12.5/25 mg every other day. He seems to think that this is helped his symptoms and feels better than he had when he saw Dr. Antoine Poche.  I saw Carl Harper back in the  office today. He says that he call and make an appointment about a month ago because his been feeling some episodes of chest discomfort in the morning. He's also felt somewhat short of breath. An EKG in the office today demonstrates new onset atrial flutter with ventricular bigeminy at a rate of 77. This is a new diagnosis for him. I spent greater than 10 minutes discussing atrial fibrillation and anticoagulation options with him. He understands his options and the need for trying to establish a sinus rhythm.  Carl Harper returns today the office for follow-up. His EKG today fortunately shows sinus rhythm with PVCs. This is good news as we will not need to schedule him for a cardioversion. He has been taking Eliquis and feels that this is tolerable. He denies any bleeding problems. Here she does feel improvement in energy and I suspect this is from converting back to sinus rhythm at some point. Based on his TIA event, I suspect it may been related to paroxysmal atrial flutter. He did have carotid Dopplers which show very mild bilateral carotid disease and are not likely the source of his TIA event.  I saw Carl Harper back today in the office. He is maintaining sinus bradycardia with first degree AV block at a rate of 52. He denies any bleeding problems on Eliquis. He is on low-dose amiodarone. This was started by Dr. Elberta Fortis on 12/30/14, after monitor was placed which showed a very high burden of PVCs greater than 30% as well as A. fib. A repeat echo was performed which showed preserved LV  systolic function. Carl Harper was symptomatic with his PVCs. He reports a marked reduction in his symptoms after starting the amiodarone. I do not see these had baseline pulmonary function testing, thyroid or liver function tests.  11/09/2015  Carl Harper returns today for follow-up. He has had recent worsening shortness of breath and significant fatigue and leg weakness with exercise. This is of fairly recent finding.  He was seen by Dr. Elberta Fortis, who noted that recently Carl Harper. Mukherjee ophthalmologist had recommended discontinuing amiodarone due to corneal deposits. Since that time, he has been having a washout of the amiodarone with the plan to reassess his burden of PVCs by monitoring at the end of September. This is a ready been scheduled. His last echo was in 2016 and showed normal LV function, but he has had new symptoms as described above and would likely benefit from a repeat echo to rule out cardiomyopathy.  02/04/2016  Carl Harper returns today for follow-up. He has been seen by Dr. Elberta Fortis for evaluation of his a-fib. He was on amiodarone, but we discontinued it due to corneal deposits. He has maintained sinus rhythm without recurrent atrial fibrillation. PVCs were noted and noted to be significant and he was started on mexiletine. Since that he's had some improvement in his symptoms although was noted to have some PVCs today. He has a follow-up with Dr. Elberta Fortis in a few months. We repeated an echocardiogram which showed normal systolic and diastolic function with mild left atrial enlargement.  08/10/2016  Carl Harper returns today. He reports his palpitations have improved significantly with the addition of mexiletine. He is seen Dr. Elberta Fortis with cardiac electrophysiology for this. Blood pressure is well-controlled today 102/52. EKG which is personally reviewed shows sinus bradycardia with marked sinus arrhythmia and first-degree AV block at 53. QTC is 377 ms with a left anterior fascicular block and first-degree AV block.   02/05/2017  Carl Harper was seen today in follow-up.  Overall is without complaints.  He denies any significant palpitations, shortness of breath, chest pain or lower extremity claudication.  He is been successfully treated on mexiletine and although he continues to have some PVCs, the frequency and burden have decreased significantly.  Blood pressure is at goal.  EKG was personally  reviewed today which is sinus rhythm, first-degree AV block and occasional PVCs, left anterior fascicular block and QTC 413 ms.  09/11/2017  Carl Harper returns today for follow-up.  Again he is doing well without complaints.  He denies any palpitations or significant arrhythmias.  He denies any chest pain or worsening shortness of breath.  He occasionally gets some fatigue with working out in the hot weather for long periods of time.  This may be related to some low normal blood pressure.  He is using Lasix as needed.  Blood pressure today was 108/58 however he generally says that his blood pressures around 120 systolic.  11/28/2017  Carl Harper is seen today in follow-up.  He seems to be doing well.  He denies any recurrent atrial fibrillation.  He is tolerating Eliquis without bleeding issues.  Blood pressures well controlled today 121/60.  He denies any PVCs and he is compliant with mexiletine and metoprolol.  EKG shows sinus bradycardia with first-degree AV block, nonspecific IVCD with QTC of 411 ms.  His only other concern is he has some right leg pain.  He does have a history of PAD.  He says sometimes this is worse with exertion and improves at rest but at other  times he gets a pain in his right hip when laying in bed, which is more concerning for a bursitis or possibly osteoarthritis.  09/02/2018  Carl Harper is seen today for follow-up.  He he had to virtual visit since I last saw him in the office.  He had some chest pain and underwent a CT of the chest because of a dilated aorta.  This showed aortic ectasia measuring between 4.8 and 5 cm.  The previous 5 cm measurement was in 2001 however his most recent CT measurement was less significant.  There was no evidence of dissection.  I had increased his Imdur however he reduced it to 30 mg daily.  He notes that he is only taking sublingual nitro about once a week.  In general he is fairly comfortable.  His EKG incidentally did show atrial  fibrillation today which is rate controlled.  He is on mexiletine and Eliquis.  01/06/2019  Carl Harper returns for follow-up.  He is complaining of some fatigue and weakness.  He just had a repeat CT which showed stable aortic ectasia measuring 4.8 cm.  He had a 5 cm aorta noted in 2001 as well.  I suspect this will continue to be stable.  He does report some fatigue and leg weakness.  Blood pressure was noted to be low today.  He is also an bradycardic response with his A. Fib.  12/12/2019  Carl Harper is seen today for follow-up.  I did lower extremity arterial Dopplers to follow-up on leg weakness however this showed no evidence of arterial insufficiency with a patent.  Abdominal aortic imaging showed no evidence of aneurysm.  He does have a thoracic aneurysm which is stable by CT measuring between 4.8 and 4.9 cm.  We will continue to follow that with annual CTs.  He is to follow-up with his PCP regarding fatigue.  Blood pressure was low today at 96/50 however his wife reported that was unusual.  He is on very little medication to lower blood pressure.  He seems to be tolerating mexiletine and is in the sinus rhythm today with PACs.  05/06/2020  Carl Harper is seen in follow-up today.  He called the office and noted that he has had worsening lower extremity swelling.  He was recently cardioverted for atrial fibrillation.  He has maintained sinus rhythm and saw Rudi Coco, NP in the A. fib clinic.  He was given an increased dose of Lasix for 3 days without much improvement.  His wife increase his Lasix up to 40mg  daily however his swelling is only minimally improved.  He also reports persistent fatigue and some shortness of breath.  In general it sounds like he is fairly sedentary according to his wife he says that he sleeps most of the morning and is back in bed with his feet elevated in the afternoon.  06/27/2021  Carl Harper returns today for follow-up.  He was recently added on because of  an increase in chest discomfort.  He saw his primary care provider about a week ago and it was noted that he had used nitroglycerin sublingual more frequently now several times a week.  He says he often gets discomfort in his chest when he wakes up.  He takes it fairly quickly but does not allow the discomfort to potentially go away.  Is not clear if this is angina since it seems to always occur at rest.  He is on long-acting isosorbide.  He has been monitored long-term for ascending  aortic aneurysm.  His last CT actually showed somewhat of a smaller measurement and it seems that the aorta measures between 4.6 and 4.8 cm.  He said he will not have surgery for this therefore I think there is little utility to continue to monitor it especially at his age.  He has been seen by EP and after cardioversion has been maintaining sinus rhythm.  He had a high burden of PVCs in the past but more recently has not had issues with him.  EKG today shows sinus bradycardia with no ischemic changes.  12/29/2021  Carl Harper. [place] returns today for follow-up.  Overall he seems to be doing well.  He has some complaints particularly in the morning with aches and pains but it does improve throughout the day.  No real chest pain complaints.  He had recent repeat vascular ultrasounds which are stable.  No stenosis of a prior stent and no significant arterial stenosis.  As this has been stable, I think we could probably follow this as needed.  He had an echo last year which showed normal systolic function but a dilated aorta up to 50 mm.  He says that he would not want surgery for this and therefore we have decided not to follow it.  He remains in A-fib/flutter.  He is on Eliquis.  Of note is also these been on aspirin for a number of years.  I am concerned about the long-term risks of bleeding on both I think we could go with Eliquis monotherapy.  07/11/2022  Carl Harper. Wilkowski is seen today in follow-up.  He is concerned that he has been  declining recently.  He has had worsening lower extremity edema.  He was seen by my partner Dr. Wyline Mood in April.  She had noted that he was orthostatic and recommended being off of beta-blocker.  He is diuretic was started at 40 mg daily.  He says he has been urinating a lot but has not really lost a lot of fluid weight.  He still has issues with dizziness and has been having some falls including having a fall hitting a glass table the other day.  Fortunately he had no bleeding with it.  I did review his med list.  There are several meds he takes which could be contributing to orthostasis.  09/14/2022  Carl Harper. [place] returns today for follow-up.  He reports some interval improvement in his orthostatic symptoms and is less dizzy but has had numerous falls this week.  He has not hit his head but I am concerned about him being on Eliquis.  He has had frequent urination and recently had an E. coli cystitis.  He has follow-up with urology next week I believe.  Increasing his diuretics has not made a big difference in his lower extremity edema.  His weight has been fairly stable.  There was a slight increase in his creatinine on diuretics with a normal BNP.  I suspect his edema may be due to third spacing, neuropathy or poor venous return.  This will not likely improve further with diuretics.  His other concern today is poor sleep and he is inquiring about sleep medications because he notes his bladder issues are painful and they keep him up at night.  PMHx:  Past Medical History:  Diagnosis Date   Benign neoplasm of colon 10/24/2006   BPH (benign prostatic hyperplasia)    Daily headache    Diverticulosis 09/03.2008   Dysrhythmia    "irregular"   Hypertension  Internal hemorrhoids without mention of complication 10/24/2006   Memory loss    PAD (peripheral artery disease) (HCC)    Restless leg syndrome    Sleep apnea    dx'd; wore mask; lost weight; turned in machine"    Past Surgical History:   Procedure Laterality Date   APPENDECTOMY  ~ 1954   ATHERECTOMY Right 11/07/2011   Procedure: ATHERECTOMY;  Surgeon: Runell Gess, MD;  Location: [facility] CATH LAB;  Service: Cardiovascular;  Laterality: Right;   CARDIAC CATHETERIZATION  03/30/1999   non-critical CAD   CARDIOVERSION N/A 04/19/2020   Procedure: CARDIOVERSION;  Surgeon: Pricilla Riffle, MD;  Location: [facility] ENDOSCOPY;  Service: Cardiovascular;  Laterality: N/A;   CATARACT EXTRACTION W/ INTRAOCULAR LENS  IMPLANT, BILATERAL  2011-2012   ILIAC ARTERY STENT Right 11/07/2011   Diamondback orbital rotational atherectomy, PTA & stenting of calcified R CIA (Dr. Erlene Quan)   LOWER EXTREMITY ANGIOGRAM N/A 11/07/2011   Procedure: LOWER EXTREMITY ANGIOGRAM;  Surgeon: Runell Gess, MD;  Location: [facility] CATH LAB;  Service: Cardiovascular;  Laterality: N/A;   NM MYOCAR PERF WALL MOTION  09/2012   lexiscan myoview - low risk with fixed inferior defect with underlying bowel attenuation suggestive of artifact   PERCUTANEOUS STENT INTERVENTION  11/07/2011   Procedure: PERCUTANEOUS STENT INTERVENTION;  Surgeon: Runell Gess, MD;  Location: [facility] CATH LAB;  Service: Cardiovascular;;   SHOULDER ARTHROSCOPY W/ ROTATOR CUFF REPAIR Left 2010   Sleep Study  01/09/2011   AHI during total sleep 14.4/hr and during REM 19.4/hr   TRANSTHORACIC ECHOCARDIOGRAM  09/2012   EF 55-60%, LA mildly dilated    FAMHx:  Family History  Problem Relation Age of Onset   Heart Problems Father 67   Cancer Brother 74   Heart failure Brother    Cancer Maternal Grandfather        prostate    SOCHx:   reports that he quit smoking about 28 years ago. His smoking use included cigarettes. He started smoking about 83 years ago. He has a 55 pack-year smoking history. He has never used smokeless tobacco. He reports current alcohol use of about 14.0 standard drinks of alcohol per week. He reports that he does not use drugs.  ALLERGIES:  Allergies  Allergen Reactions   Amiodarone  Other (See Comments)    Other reaction(s): Other (See Comments) Corneal deposits Corneal deposits   Bee Venom Other (See Comments)    unknown    ROS: Pertinent items noted in HPI and remainder of comprehensive ROS otherwise negative.  HOME MEDS: Current Outpatient Medications  Medication Sig Dispense Refill   alendronate (FOSAMAX) 70 MG tablet Take 70 mg by mouth every Sunday.     apixaban (ELIQUIS) 5 MG TABS tablet Take 1 tablet (5 mg total) by mouth 2 (two) times daily. 60 tablet 5   aspirin 81 MG tablet Take 81 mg by mouth daily.     Calcium Citrate 250 MG TABS Take 1-2 tablets by mouth See admin instructions. 2 tabs twice daily, 1 tab at noon     Cholecalciferol (VITAMIN D3) 50 MCG (2000 UT) CAPS Take 2,000 Units by mouth daily.     cycloSPORINE (RESTASIS) 0.05 % ophthalmic emulsion Place one drop into both eyes 2 (two) times daily.     diclofenac Sodium (VOLTAREN) 1 % GEL SMARTSIG:2 Topical Twice Daily     furosemide (LASIX) 40 MG tablet Take 1 tablet (40 mg total) by mouth 2 (two) times daily. 180 tablet 3  losartan (COZAAR) 25 MG tablet TAKE ONE TABLET BY MOUTH DAILY 90 tablet 3   Multiple Vitamin (MULTIVITAMIN WITH MINERALS) TABS tablet Take 1 tablet by mouth daily.     Multiple Vitamins-Minerals (PRESERVISION AREDS) CAPS Take 1 capsule by mouth in the morning and at bedtime.     nitroGLYCERIN (NITROSTAT) 0.4 MG SL tablet Place 1 tablet (0.4 mg total) under the tongue every 5 (five) minutes as needed for chest pain. 75 tablet 2   pramipexole (MIRAPEX) 0.75 MG tablet Take 0.75 mg by mouth daily.     Tamsulosin HCl (FLOMAX) 0.4 MG CAPS Take 0.4 mg by mouth daily.     traZODone (DESYREL) 100 MG tablet Take 100 mg by mouth at bedtime.  3   XIIDRA 5 % SOLN Place 5 % into both eyes 2 (two) times daily.     ipratropium (ATROVENT) 0.03 % nasal spray  (Patient not taking: Reported on 09/14/2022)     Vitamin D, Ergocalciferol, (DRISDOL) 1.25 MG (50000 UNIT) CAPS capsule Take by mouth.  (Patient not taking: Reported on 09/14/2022)     No current facility-administered medications for this visit.    LABS/IMAGING: No results found for this or any previous visit (from the past 48 hour(s)). No results found.  VITALS: There were no vitals taken for this visit.  EXAM: General appearance: alert and no distress Neck: no carotid bruit, no JVD and thyroid not enlarged, symmetric, no tenderness/mass/nodules Lungs: clear to auscultation bilaterally Heart: irregularly irregular rhythm Abdomen: soft, non-tender; bowel sounds normal; no masses,  no organomegaly Extremities: edema 2+ bilateral pitting LE to the mid shin Pulses: 2+ and symmetric Skin: Skin color, texture, turgor normal. No rashes or lesions Neurologic: Grossly normal Psych: Pleasant  EKG: EKG Interpretation Date/Time:  Thursday September 14 2022 13:42:52 EDT Ventricular Rate:  81 PR Interval:    QRS Duration:  118 QT Interval:  362 QTC Calculation: 420 R Axis:   -59  Text Interpretation: Coarse atrial fibrillation / flutter with CVR Left axis deviation Non-specific intra-ventricular conduction delay When compared with ECG of 14-Sep-2022 13:39, No significant change was found Confirmed by Zoila Shutter [phone]) on 09/14/2022 2:12:57 PM    ASSESSMENT: Paroxysmal afib/flutter with frequent PVC's - CHADSVASC score of 4 on Eliquis- amiodarone discontinued due to corneal deposits PVC's -suppressed with mexilitine PAD status post diamondback were orbital atherectomy and stenting of the right common iliac artery Hypertension Dyslipidemia Mild CAD Obstructive sleep apnea- not on CPAP DOE - negative treadmill stress test Tremor/memory loss/gait abnormalities - no evidence of Parkinson's by recent neurology consult Ascending aortic aneurysm measuring 4.8 cm (5.0 cm measurement in 2001) -plan discontinue surveillance as he does not want to consider aortic surgery.  PLAN: 1.   Carl Harper. [place] has not had any significant  improvement in lower extremity edema after increasing his Lasix to 80 mg twice a day.  BNP is normal.  I would be hesitant to increase it any further due to concern about worsening renal function.  EKG today shows an A-fib flutter which is rate controlled.  He is on Eliquis but has had recent falls which is concerning.  He has had ongoing issues with his urinary tract including what he tells me was a recent E. coli cystitis.Marland Kitchen  He has frequent pain related to this and has led to poor sleep.  I would be hesitant to provide sleep medications due to significant side effect potentials in an 88 year old male.  He is requesting a refill of his nitro today  which I could provide.  Otherwise no changes to his medicines.  Plan follow-up in 6 months or sooner as necessary.  Chrystie Nose, MD, [facility], FACP  [place]  [facility]  Medical Director of the [facility] Lipid [facility] &  Cardiovascular Risk Reduction Clinic Attending Cardiologist  Direct Dial: [phone]  Fax: [phone]  Website:  [email or web address]  Lisette Abu Morayma [place] 09/14/2022, 1:37 PM

## 2022-10-27 ENCOUNTER — Other Ambulatory Visit: Payer: Self-pay | Admitting: *Deleted

## 2022-10-27 DIAGNOSIS — I4892 Unspecified atrial flutter: Secondary | ICD-10-CM

## 2022-10-27 DIAGNOSIS — I480 Paroxysmal atrial fibrillation: Secondary | ICD-10-CM

## 2022-10-27 NOTE — Telephone Encounter (Signed)
Eliquis 5mg  refill request received. Patient is 88 years old, weight-82.5kg, Crea-1.52 on 07/20/22, Diagnosis-Aflutter, and last seen by Dr. Rennis Golden on 09/14/22. Dose is appropriate based on dosing criteria.   Will ask PharmD to evaluate dose since  creatinine level has increased.

## 2022-10-30 ENCOUNTER — Telehealth: Payer: Self-pay

## 2022-10-30 MED ORDER — APIXABAN 2.5 MG PO TABS: 2.5000 mg | ORAL_TABLET | Freq: Two times a day (BID) | ORAL | 5 refills | Status: DC

## 2022-10-30 NOTE — Telephone Encounter (Signed)
Prescription refill request for Eliquis received. Indication:afib Last office visit:7/24 Scr:1.52  5/24 Age: 88 Weight:82.5  kg  Under review for dose change

## 2022-11-06 ENCOUNTER — Other Ambulatory Visit: Payer: Self-pay | Admitting: *Deleted

## 2022-11-06 NOTE — Telephone Encounter (Signed)
error 

## 2022-11-26 ENCOUNTER — Other Ambulatory Visit: Payer: Self-pay | Admitting: Internal Medicine

## 2023-01-31 ENCOUNTER — Observation Stay (HOSPITAL_COMMUNITY): Payer: Medicare HMO

## 2023-01-31 ENCOUNTER — Emergency Department (HOSPITAL_COMMUNITY): Payer: Medicare HMO

## 2023-01-31 ENCOUNTER — Encounter (HOSPITAL_COMMUNITY): Payer: Self-pay

## 2023-01-31 ENCOUNTER — Inpatient Hospital Stay (HOSPITAL_COMMUNITY)
Admission: EM | Admit: 2023-01-31 | Discharge: 2023-02-03 | DRG: 552 | Disposition: A | Discharge status: Home Health | Payer: Medicare HMO | Attending: Internal Medicine | Admitting: Internal Medicine

## 2023-01-31 DIAGNOSIS — N1832 Chronic kidney disease, stage 3b: Secondary | ICD-10-CM | POA: Diagnosis not present

## 2023-01-31 DIAGNOSIS — Z8249 Family history of ischemic heart disease and other diseases of the circulatory system: Secondary | ICD-10-CM

## 2023-01-31 DIAGNOSIS — S32059A Unspecified fracture of fifth lumbar vertebra, initial encounter for closed fracture: Secondary | ICD-10-CM | POA: Diagnosis not present

## 2023-01-31 DIAGNOSIS — Z79899 Other long term (current) drug therapy: Secondary | ICD-10-CM

## 2023-01-31 DIAGNOSIS — S32019A Unspecified fracture of first lumbar vertebra, initial encounter for closed fracture: Principal | ICD-10-CM | POA: Diagnosis present

## 2023-01-31 DIAGNOSIS — I480 Paroxysmal atrial fibrillation: Secondary | ICD-10-CM | POA: Diagnosis present

## 2023-01-31 DIAGNOSIS — Z91030 Bee allergy status: Secondary | ICD-10-CM | POA: Diagnosis not present

## 2023-01-31 DIAGNOSIS — I5032 Chronic diastolic (congestive) heart failure: Secondary | ICD-10-CM | POA: Diagnosis present

## 2023-01-31 DIAGNOSIS — I700 Atherosclerosis of aorta: Secondary | ICD-10-CM | POA: Diagnosis present

## 2023-01-31 DIAGNOSIS — Z9181 History of falling: Secondary | ICD-10-CM

## 2023-01-31 DIAGNOSIS — C44712 Basal cell carcinoma of skin of right lower limb, including hip: Secondary | ICD-10-CM | POA: Insufficient documentation

## 2023-01-31 DIAGNOSIS — M47812 Spondylosis without myelopathy or radiculopathy, cervical region: Secondary | ICD-10-CM | POA: Diagnosis present

## 2023-01-31 DIAGNOSIS — Z888 Allergy status to other drugs, medicaments and biological substances status: Secondary | ICD-10-CM

## 2023-01-31 DIAGNOSIS — M4312 Spondylolisthesis, cervical region: Secondary | ICD-10-CM | POA: Diagnosis present

## 2023-01-31 DIAGNOSIS — N183 Chronic kidney disease, stage 3 unspecified: Secondary | ICD-10-CM | POA: Diagnosis present

## 2023-01-31 DIAGNOSIS — Y92019 Unspecified place in single-family (private) house as the place of occurrence of the external cause: Secondary | ICD-10-CM

## 2023-01-31 DIAGNOSIS — M545 Low back pain, unspecified: Secondary | ICD-10-CM | POA: Diagnosis present

## 2023-01-31 DIAGNOSIS — F0390 Unspecified dementia without behavioral disturbance: Secondary | ICD-10-CM | POA: Diagnosis not present

## 2023-01-31 DIAGNOSIS — I4892 Unspecified atrial flutter: Secondary | ICD-10-CM | POA: Diagnosis present

## 2023-01-31 DIAGNOSIS — I2510 Atherosclerotic heart disease of native coronary artery without angina pectoris: Secondary | ICD-10-CM | POA: Diagnosis present

## 2023-01-31 DIAGNOSIS — D72829 Elevated white blood cell count, unspecified: Secondary | ICD-10-CM | POA: Diagnosis not present

## 2023-01-31 DIAGNOSIS — I10 Essential (primary) hypertension: Secondary | ICD-10-CM | POA: Diagnosis present

## 2023-01-31 DIAGNOSIS — M48061 Spinal stenosis, lumbar region without neurogenic claudication: Secondary | ICD-10-CM | POA: Diagnosis present

## 2023-01-31 DIAGNOSIS — R519 Headache, unspecified: Secondary | ICD-10-CM | POA: Diagnosis present

## 2023-01-31 DIAGNOSIS — I739 Peripheral vascular disease, unspecified: Secondary | ICD-10-CM | POA: Diagnosis present

## 2023-01-31 DIAGNOSIS — S32119A Unspecified Zone I fracture of sacrum, initial encounter for closed fracture: Principal | ICD-10-CM | POA: Diagnosis present

## 2023-01-31 DIAGNOSIS — E785 Hyperlipidemia, unspecified: Secondary | ICD-10-CM | POA: Diagnosis not present

## 2023-01-31 DIAGNOSIS — S3210XA Unspecified fracture of sacrum, initial encounter for closed fracture: Secondary | ICD-10-CM

## 2023-01-31 DIAGNOSIS — Z87891 Personal history of nicotine dependence: Secondary | ICD-10-CM | POA: Diagnosis not present

## 2023-01-31 DIAGNOSIS — N401 Enlarged prostate with lower urinary tract symptoms: Secondary | ICD-10-CM | POA: Diagnosis present

## 2023-01-31 DIAGNOSIS — Z95820 Peripheral vascular angioplasty status with implants and grafts: Secondary | ICD-10-CM

## 2023-01-31 DIAGNOSIS — G4733 Obstructive sleep apnea (adult) (pediatric): Secondary | ICD-10-CM | POA: Diagnosis present

## 2023-01-31 DIAGNOSIS — D631 Anemia in chronic kidney disease: Secondary | ICD-10-CM | POA: Diagnosis present

## 2023-01-31 DIAGNOSIS — I5189 Other ill-defined heart diseases: Secondary | ICD-10-CM

## 2023-01-31 DIAGNOSIS — R296 Repeated falls: Secondary | ICD-10-CM | POA: Diagnosis present

## 2023-01-31 DIAGNOSIS — W19XXXA Unspecified fall, initial encounter: Secondary | ICD-10-CM | POA: Diagnosis present

## 2023-01-31 DIAGNOSIS — G2581 Restless legs syndrome: Secondary | ICD-10-CM | POA: Diagnosis not present

## 2023-01-31 DIAGNOSIS — Z7901 Long term (current) use of anticoagulants: Secondary | ICD-10-CM | POA: Diagnosis not present

## 2023-01-31 DIAGNOSIS — D649 Anemia, unspecified: Secondary | ICD-10-CM | POA: Diagnosis present

## 2023-01-31 DIAGNOSIS — Z8042 Family history of malignant neoplasm of prostate: Secondary | ICD-10-CM

## 2023-01-31 DIAGNOSIS — N3943 Post-void dribbling: Secondary | ICD-10-CM | POA: Diagnosis present

## 2023-01-31 DIAGNOSIS — Z7983 Long term (current) use of bisphosphonates: Secondary | ICD-10-CM

## 2023-01-31 DIAGNOSIS — M4322 Fusion of spine, cervical region: Secondary | ICD-10-CM | POA: Diagnosis present

## 2023-01-31 DIAGNOSIS — I708 Atherosclerosis of other arteries: Secondary | ICD-10-CM | POA: Diagnosis present

## 2023-01-31 DIAGNOSIS — I130 Hypertensive heart and chronic kidney disease with heart failure and stage 1 through stage 4 chronic kidney disease, or unspecified chronic kidney disease: Secondary | ICD-10-CM | POA: Diagnosis present

## 2023-01-31 DIAGNOSIS — Z7982 Long term (current) use of aspirin: Secondary | ICD-10-CM

## 2023-01-31 DIAGNOSIS — G8929 Other chronic pain: Secondary | ICD-10-CM | POA: Diagnosis present

## 2023-01-31 DIAGNOSIS — K5900 Constipation, unspecified: Secondary | ICD-10-CM | POA: Diagnosis present

## 2023-01-31 DIAGNOSIS — M4807 Spinal stenosis, lumbosacral region: Secondary | ICD-10-CM | POA: Diagnosis present

## 2023-01-31 DIAGNOSIS — Z8719 Personal history of other diseases of the digestive system: Secondary | ICD-10-CM

## 2023-01-31 HISTORY — DX: Other ill-defined heart diseases: I51.89

## 2023-01-31 LAB — BASIC METABOLIC PANEL
Anion gap: 7 (ref 5–15)
BUN: 31 mg/dL — ABNORMAL HIGH (ref 8–23)
CO2: 22 mmol/L (ref 22–32)
Calcium: 8.6 mg/dL — ABNORMAL LOW (ref 8.9–10.3)
Chloride: 109 mmol/L (ref 98–111)
Creatinine, Ser: 1.31 mg/dL — ABNORMAL HIGH (ref 0.61–1.24)
GFR, Estimated: 53 mL/min — ABNORMAL LOW (ref >60)
Glucose, Bld: 97 mg/dL (ref 70–99)
Potassium: 3.9 mmol/L (ref 3.5–5.1)
Sodium: 138 mmol/L (ref 135–145)

## 2023-01-31 LAB — CBC
HCT: 36.8 % — ABNORMAL LOW (ref 39.0–52.0)
Hemoglobin: 11.7 g/dL — ABNORMAL LOW (ref 13.0–17.0)
MCH: 28.2 pg (ref 26.0–34.0)
MCHC: 31.8 g/dL (ref 30.0–36.0)
MCV: 88.7 fL (ref 80.0–100.0)
Platelets: 306 10*3/uL (ref 150–400)
RBC: 4.15 MIL/uL — ABNORMAL LOW (ref 4.22–5.81)
RDW: 14 % (ref 11.5–15.5)
WBC: 11.5 10*3/uL — ABNORMAL HIGH (ref 4.0–10.5)
nRBC: 0 % (ref 0.0–0.2)

## 2023-01-31 MED ORDER — TAMSULOSIN HCL 0.4 MG PO CAPS
0.4000 mg | ORAL_CAPSULE | Freq: Every day | ORAL | Status: DC
  Administered 2023-01-31 – 2023-02-03 (×4): 0.4 mg via ORAL
  Filled 2023-01-31 (×4): qty 1

## 2023-01-31 MED ORDER — LIDOCAINE 5 % EX PTCH
1.0000 | MEDICATED_PATCH | Freq: Once | CUTANEOUS | Status: AC
  Administered 2023-01-31: 1 via TRANSDERMAL
  Filled 2023-01-31: qty 1

## 2023-01-31 MED ORDER — MAGNESIUM HYDROXIDE NICU ORAL SYRINGE 400 MG/5 ML: 15.0000 mL | Freq: Every day | ORAL | Status: DC | PRN

## 2023-01-31 MED ORDER — FINASTERIDE 5 MG PO TABS
5.0000 mg | ORAL_TABLET | Freq: Every day | ORAL | Status: DC
  Administered 2023-01-31 – 2023-02-03 (×4): 5 mg via ORAL
  Filled 2023-01-31 (×4): qty 1

## 2023-01-31 MED ORDER — OXYCODONE HCL 5 MG PO TABS
5.0000 mg | ORAL_TABLET | ORAL | Status: DC | PRN
  Administered 2023-01-31 – 2023-02-03 (×3): 5 mg via ORAL
  Filled 2023-01-31 (×3): qty 1

## 2023-01-31 MED ORDER — PRAMIPEXOLE DIHYDROCHLORIDE 1 MG PO TABS
1.5000 mg | ORAL_TABLET | Freq: Every day | ORAL | Status: DC
  Administered 2023-02-01 – 2023-02-02 (×2): 1.5 mg via ORAL
  Filled 2023-01-31 (×4): qty 2

## 2023-01-31 MED ORDER — OXYCODONE-ACETAMINOPHEN 5-325 MG PO TABS
1.0000 | ORAL_TABLET | Freq: Once | ORAL | Status: AC
Start: 2023-01-31 — End: 2023-01-31
  Administered 2023-01-31: 1 via ORAL
  Filled 2023-01-31: qty 1

## 2023-01-31 MED ORDER — CYCLOSPORINE 0.05 % OP EMUL
1.0000 [drp] | Freq: Two times a day (BID) | OPHTHALMIC | Status: DC
  Administered 2023-01-31 – 2023-02-03 (×6): 1 [drp] via OPHTHALMIC
  Filled 2023-01-31 (×7): qty 30

## 2023-01-31 MED ORDER — IOHEXOL 350 MG/ML SOLN
65.0000 mL | Freq: Once | INTRAVENOUS | Status: AC | PRN
  Administered 2023-01-31: 65 mL via INTRAVENOUS

## 2023-01-31 MED ORDER — ACETAMINOPHEN 500 MG PO TABS
1000.0000 mg | ORAL_TABLET | Freq: Once | ORAL | Status: AC
  Administered 2023-01-31: 1000 mg via ORAL
  Filled 2023-01-31: qty 2

## 2023-01-31 MED ORDER — CIPROFLOXACIN HCL 500 MG PO TABS
250.0000 mg | ORAL_TABLET | Freq: Two times a day (BID) | ORAL | Status: DC
  Administered 2023-01-31 – 2023-02-03 (×6): 250 mg via ORAL
  Filled 2023-01-31 (×6): qty 1

## 2023-01-31 MED ORDER — HYDROMORPHONE HCL 1 MG/ML IJ SOLN
0.5000 mg | INTRAMUSCULAR | Status: DC | PRN
  Administered 2023-01-31 – 2023-02-03 (×13): 0.5 mg via INTRAVENOUS
  Filled 2023-01-31 (×13): qty 0.5

## 2023-01-31 MED ORDER — LOSARTAN POTASSIUM 25 MG PO TABS
25.0000 mg | ORAL_TABLET | Freq: Every day | ORAL | Status: DC
  Administered 2023-01-31 – 2023-02-03 (×4): 25 mg via ORAL
  Filled 2023-01-31 (×4): qty 1

## 2023-01-31 MED ORDER — SENNOSIDES-DOCUSATE SODIUM 8.6-50 MG PO TABS
1.0000 | ORAL_TABLET | Freq: Two times a day (BID) | ORAL | Status: DC
  Administered 2023-01-31 – 2023-02-03 (×6): 1 via ORAL
  Filled 2023-01-31 (×6): qty 1

## 2023-01-31 NOTE — Care Plan (Signed)
Orthopaedic Surgery Plan of Care Note   -history and imaging reviewed with primary team (ER) -pt has lumbopelvic rx from fall at home including bilateral sacral ala -baseline community ambulator, lives at home with wife -reviewed CT with Ortho Trauma colleagues -admit to Hospitalist team at [facility] -PT/OT to assist with mobilization, if pain uncontrolled with failed attempt at mobilization will plan on ORIF sacrum by Ortho Trauma team on Friday 02/02/23 if indicated -full consult note to follow   Netta Cedars, MD Orthopaedic Surgery [facility]

## 2023-01-31 NOTE — Progress Notes (Signed)
Patient ID: Carl Harper, male   DOB: 02-06-1935, 88 y.o.   MRN: [id number] BP 121/63   Pulse 67   Temp 97.7 F (36.5 C) (Oral)   Resp 18   SpO2 94%  Films reviewed. There are no neurosurgical concerns with the presumed acute lumbar fractures. Bracing is not needed. Alignment is normal. Recommend trauma orthopedics for sacral fracture evaluation.

## 2023-01-31 NOTE — Progress Notes (Signed)
Pt arrived to [facility] from [facility] ED via [facility], alert and oriented, wife came in.

## 2023-01-31 NOTE — Progress Notes (Signed)
Both fall mats placed on each side of bed and yellow arm band placed on pt. Given menu to order. 6 2x2 foam dressings placed on various skin tears and scrapes on arms and legs. Bilateral lower extremities are edematous, 2+ pitting edema. Heels are OK.

## 2023-01-31 NOTE — H&P (Addendum)
History and Physical    Patient: Carl Harper:[id number] DOB: 1934/11/23 DOA: 01/31/2023 DOS: the patient was seen and examined on 01/31/2023 PCP: Tracey Harries, MD  Patient coming from: Home  Chief Complaint:  Chief Complaint  Patient presents with   Fall   Back Pain   HPI: Carl Harper is a 88 y.o. male with medical history significant of benign neoplasm of the colon, BPH, chronic lower back pain, daily headaches, diverticulosis, hemorrhoids, memory loss, restless leg syndrome, sleep apnea no longer on CPAP after weight loss, hypertension, stage 3b CKD, grade 1 diastolic dysfunction, PAD, atrial fibrillation/flutter on apixaban who came into the emergency department after having an unwitnessed fall at home hitting the back of the head on a wall, but no LOC.  He has had significant trauma of the lumbosacral spine area.  No prodromal symptoms.He denied fever, chills, rhinorrhea, sore throat, wheezing or hemoptysis.  No chest pain, palpitations, diaphoresis, PND, orthopnea or pitting edema of the lower extremities.  No abdominal pain, nausea, emesis, melena or hematochezia.  He gets diarrhea once or twice a week.  However, he has been constipated for the past few days, but passing gas.  No flank pain, dysuria, frequency or hematuria.  No polyuria, polydipsia, polyphagia or blurred vision.   Lab work: CBC a white count of 11.5, hemoglobin 11.7 g/dL and platelets 440.  BMP showed a BUN of 31, creatinine 1.31 and calcium of 8.6 mg/deciliter.  Glucose and the rest of the electrolytes were normal.  Imaging: One-view pelvics x-ray with no acute finding.  Portable 1 view chest with no active disease.  CT head without contrast with mildly motion degraded exam.  No acute posttraumatic intracranial findings.  Moderate chronic small vessel changes within the cerebral white matter.  Mild to moderate dilation of the lateral and third ventricles.  Paranasal sinus disease.  CT cervical spine with no  evidence of an acute cervical spine fracture.  There is a 2 mm C4-C5 grade 1 anterolisthesis.  Cervical spondylosis.  Multiple vertebral ankylosis.  CT lumbar spine showing acute nondisplaced fracture through the anterior superior endplate of L5 on the left.  Likely additional nondisplaced fracture through the right lateral aspect of the L1 vertebral body.  Acute fractures through the bilateral sacral ala.  Age-indeterminate fracture of the posterior elements of S3.  Severe spinal canal stenosis at L4-L5 and L5-S1.  Consider further evaluation with lumbar spine MRI.  Aortic atherosclerosis.  ED course: Initial vital signs were temperature 97.8 F, pulse 70, respiration 20, BP 150 over 170 mmHg O2 sats 98% on room air.  The patient received a tablet of oxycodone/APAP 5/325 mg p.o. x 1 and 1000 mg of acetaminophen 3 hours prior.   Review of Systems: As mentioned in the history of present illness. All other systems reviewed and are negative. Past Medical History:  Diagnosis Date   Benign neoplasm of colon 10/24/2006   BPH (benign prostatic hyperplasia)    Daily headache    Diverticulosis 09/03.2008   Dysrhythmia    "irregular"   Hypertension    Internal hemorrhoids without mention of complication 10/24/2006   Memory loss    PAD (peripheral artery disease) (HCC)    Restless leg syndrome    Sleep apnea    dx'd; wore mask; lost weight; turned in machine"   Past Surgical History:  Procedure Laterality Date   APPENDECTOMY  ~ 1954   ATHERECTOMY Right 11/07/2011   Procedure: ATHERECTOMY;  Surgeon: Runell Gess, MD;  Location: [facility] CATH LAB;  Service: Cardiovascular;  Laterality: Right;   CARDIAC CATHETERIZATION  03/30/1999   non-critical CAD   CARDIOVERSION N/A 04/19/2020   Procedure: CARDIOVERSION;  Surgeon: Pricilla Riffle, MD;  Location[facility] ENDOSCOPY;  Service: Cardiovascular;  Laterality: N/A;   CATARACT EXTRACTION W/ INTRAOCULAR LENS  IMPLANT, BILATERAL  2011-2012   ILIAC ARTERY STENT Right  11/07/2011   Diamondback orbital rotational atherectomy, PTA & stenting of calcified R CIA (Dr. Erlene Quan)   LOWER EXTREMITY ANGIOGRAM N/A 11/07/2011   Procedure: LOWER EXTREMITY ANGIOGRAM;  Surgeon: Runell Gess, MD;  Location: [facility] CATH LAB;  Service: Cardiovascular;  Laterality: N/A;   NM MYOCAR PERF WALL MOTION  09/2012   lexiscan myoview - low risk with fixed inferior defect with underlying bowel attenuation suggestive of artifact   PERCUTANEOUS STENT INTERVENTION  11/07/2011   Procedure: PERCUTANEOUS STENT INTERVENTION;  Surgeon: Runell Gess, MD;  Location: [facility] CATH LAB;  Service: Cardiovascular;;   SHOULDER ARTHROSCOPY W/ ROTATOR CUFF REPAIR Left 2010   Sleep Study  01/09/2011   AHI during total sleep 14.4/hr and during REM 19.4/hr   TRANSTHORACIC ECHOCARDIOGRAM  09/2012   EF 55-60%, LA mildly dilated   Social History:  reports that he quit smoking about 28 years ago. His smoking use included cigarettes. He started smoking about 83 years ago. He has a 55 pack-year smoking history. He has never used smokeless tobacco. He reports current alcohol use of about 14.0 standard drinks of alcohol per week. He reports that he does not use drugs.  Allergies  Allergen Reactions   Amiodarone Other (See Comments)    Other reaction(s): Other (See Comments) Corneal deposits Corneal deposits   Bee Venom Other (See Comments)    unknown    Family History  Problem Relation Age of Onset   Heart Problems Father 103   Cancer Brother 20   Heart failure Brother    Cancer Maternal Grandfather        prostate    Prior to Admission medications   Medication Sig Start Date End Date Taking? Authorizing Provider  alendronate (FOSAMAX) 70 MG tablet Take 70 mg by mouth every Sunday. 04/12/20   [provider]  apixaban (ELIQUIS) 2.5 MG TABS tablet Take 1 tablet (2.5 mg total) by mouth 2 (two) times daily. 10/30/22   Chrystie Nose, MD  aspirin 81 MG tablet Take 81 mg by mouth daily.    [provider]  Calcium Citrate 250 MG TABS Take 1-2 tablets by mouth See admin instructions. 2 tabs twice daily, 1 tab at noon    [provider]  Cholecalciferol (VITAMIN D3) 50 MCG (2000 UT) CAPS Take 2,000 Units by mouth daily.    [provider]  cycloSPORINE (RESTASIS) 0.05 % ophthalmic emulsion Place one drop into both eyes 2 (two) times daily. 05/11/17   [provider]  diclofenac Sodium (VOLTAREN) 1 % GEL SMARTSIG:2 Topical Twice Daily 06/22/21   [provider]  furosemide (LASIX) 40 MG tablet Take 1 tablet (40 mg total) by mouth 2 (two) times daily. 07/11/22   Hilty, Lisette Abu, MD  ipratropium (ATROVENT) 0.03 % nasal spray  06/12/20   [provider]  losartan (COZAAR) 25 MG tablet TAKE ONE TABLET BY MOUTH ONE TIME DAILY 11/28/22   Hilty, Lisette Abu, MD  Multiple Vitamin (MULTIVITAMIN WITH MINERALS) TABS tablet Take 1 tablet by mouth daily.    [provider]  Multiple Vitamins-Minerals (PRESERVISION AREDS) CAPS Take 1 capsule  by mouth in the morning and at bedtime.    [provider]  nitroGLYCERIN (NITROSTAT) 0.4 MG SL tablet Place 1 tablet (0.4 mg total) under the tongue every 5 (five) minutes as needed for chest pain. 09/14/22   Hilty, Lisette Abu, MD  pramipexole (MIRAPEX) 0.75 MG tablet Take 0.75 mg by mouth daily. 02/04/18   [provider]  Tamsulosin HCl (FLOMAX) 0.4 MG CAPS Take 0.4 mg by mouth daily.    [provider]  traZODone (DESYREL) 100 MG tablet Take 100 mg by mouth at bedtime. 12/19/15   [provider]  XIIDRA 5 % SOLN Place 5 % into both eyes 2 (two) times daily. 05/28/20   [provider]    Physical Exam: Vitals:   01/31/23 1015 01/31/23 1030 01/31/23 1045 01/31/23 1211  BP: 129/67 120/62 121/63   Pulse: 68 68 67   Resp: 19 17 18    Temp:    97.7 F (36.5 C)  TempSrc:    Oral  SpO2: 96% 94% 94%    Physical Exam Vitals and nursing note reviewed.  Constitutional:       General: He is awake. He is not in acute distress.    Appearance: Normal appearance. He is ill-appearing.  HENT:     Head: Normocephalic.     Nose: No rhinorrhea.     Mouth/Throat:     Mouth: Mucous membranes are moist.  Eyes:     General: No scleral icterus.    Pupils: Pupils are equal, round, and reactive to light.  Neck:     Vascular: No JVD.  Cardiovascular:     Rate and Rhythm: Normal rate and regular rhythm.     Heart sounds: S1 normal and S2 normal.  Pulmonary:     Effort: Pulmonary effort is normal.     Breath sounds: Normal breath sounds. No wheezing, rhonchi or rales.  Abdominal:     General: Bowel sounds are normal. There is no distension.     Palpations: Abdomen is soft.     Tenderness: There is no abdominal tenderness.  Musculoskeletal:     Cervical back: Neck supple.     Lumbar back: Tenderness present. Decreased range of motion.     Right lower leg: No edema.     Left lower leg: No edema.  Skin:    General: Skin is warm and dry.     Findings: Bruising present.  Neurological:     General: No focal deficit present.     Mental Status: He is alert and oriented to person, place, and time.  Psychiatric:        Mood and Affect: Mood normal.        Behavior: Behavior normal. Behavior is cooperative.     Data Reviewed:  Results are pending, will review when available.  06/01/2020 transthoracic echocardiogram.   IMPRESSIONS:   1. Left ventricular ejection fraction, by estimation, is 60 to 65%. The left ventricle has normal function. The left ventricle has no regional wall motion abnormalities. Left ventricular diastolic parameters are consistent with Grade I diastolic dysfunction (impaired relaxation).  2. Right ventricular systolic function is normal. The right ventricular size is normal. There is normal pulmonary artery systolic pressure. The estimated right ventricular systolic pressure is 21.2 mmHg.  3. Left atrial size was mildly dilated.  4. The  mitral valve is grossly normal. Trivial mitral valve regurgitation.  5. The aortic valve is tricuspid. There is moderate calcification of the aortic valve. Aortic valve regurgitation  is trivial. Mild to moderate aortic valve sclerosis/calcification is present, without any evidence of aortic stenosis.  6. Aortic dilatation noted. There is mild dilatation of the aortic root, measuring 40 mm. There is severe dilatation of the ascending aorta, measuring 50 mm.  7. The inferior vena cava is dilated in size with >50% respiratory variability, suggesting right atrial pressure of 8 mmHg.  EKG: Vent. rate 70 BPM PR interval * ms QRS duration 113 ms QT/QTcB 423/457 ms P-R-T axes * -50 47 Atrial flutter with predominant 4:1 AV block Incomplete left bundle branch block Anterior Q waves, possibly due to ILBBB  Assessment and Plan: Principal Problem:   Sacral fracture, closed (HCC) Superimposed on:   Chronic left-sided low back pain without sciatica Admit to telemetry/inpatient. Analgesics as needed. Antiemetics as needed. Consult TOC team. Consult nutritional services. PT/OT evaluation. Orthopedic surgery input appreciated. -Will follow recommendations of full consult. -Patient informed that I will hold apixaban tonight.   in case surgery is planned for later this week. -Resume apixaban if surgery not needed or postop.  Active Problems:  Closed L5 vertebral fracture [facility]) Neurosurgery on-call contacted by ED. No surgical intervention at this time. Will follow recommendations from neurosurgery.    PAD (peripheral artery disease) (HCC)   Aortic atherosclerosis (HCC) On apixaban. Might benefit from therapy with a statin. Follow-up with primary care provider.    HTN (hypertension) Continue losartan 25 mg p.o. daily. BP soft after analgesics. Will hold furosemide today.    OSA on CPAP No longer on CPAP.    Paroxysmal atrial fibrillation (HCC) CHA?DS?-VASc Score of at least  5. Hold DOAC. Keep electrolytes optimized.    Coronary artery disease involving native  coronary artery of native heart without angina pectoris No angina at this time. Nitroglycerin as needed. Resume apixaban in the future. Not on statin. Follow-up with PCP and cardiology as an outpatient.    Grade I diastolic dysfunction Seems to be euvolemic. Holding furosemide to avoid hypotension. Resume furosemide in 24 to 48 hours.    Benign prostatic hyperplasia with post-void dribbling Continue nostra 5 mg p.o. daily. Continue tamsulosin 0.4 mg p.o. daily.    Restless legs syndrome Continue pramipexole 1.5 mg p.o. bedtime.    Chronic kidney disease (CKD), stage III (moderate) (HCC) Monitor renal function electrolytes.    Normocytic anemia Monitor hematocrit and hemoglobin.    Advance Care Planning:   Code Status: Full Code   Consults: Orthopedic surgery (Dr. Odis Hollingshead).  Family Communication:   Severity of Illness: The appropriate patient status for this patient is INPATIENT. Inpatient status is judged to be reasonable and necessary in order to provide the required intensity of service to ensure the patient's safety. The patient's presenting symptoms, physical exam findings, and initial radiographic and laboratory data in the context of their chronic comorbidities is felt to place them at high risk for further clinical deterioration. Furthermore, it is not anticipated that the patient will be medically stable for discharge from the hospital within 2 midnights of admission.   * I certify that at the point of admission it is my clinical judgment that the patient will require inpatient hospital care spanning beyond 2 midnights from the point of admission due to high intensity of service, high risk for further deterioration and high frequency of surveillance required.*  Author: Bobette Mo, MD 01/31/2023 2:11 PM  For on call review [email or web address].   This document was  prepared using Dragon voice recognition software and may contain some unintended transcription errors.

## 2023-01-31 NOTE — ED Triage Notes (Signed)
Pt BIB [facility] from Home due to a unwitnessed fall. Pt hit back of head on wall; reports no pain in head but has lower back pain. No LOC. Pt is a blood thinner eliquis. Hx of stroke and reports 13 falls this year. Alert and Oriented at Baseline. Pt has active UTI.  BP 170/100 CBG 101

## 2023-01-31 NOTE — Progress Notes (Signed)
[facility] admitting physician addendum:  The nursing staff reported that the patient feels like his abdomen is getting more distended.  He stated earlier he was constipated for the past couple days.  CT scan of the abdomen/pelvis has been ordered.  Sanda Klein, MD.

## 2023-01-31 NOTE — Plan of Care (Signed)
Pt high fall risk, wife at bedside. Cardiac monitoring on and called to verify.

## 2023-01-31 NOTE — ED Provider Notes (Signed)
Carl Harper   Carl Harper Arrival date & time: 01/31/23  2130     History  Chief Complaint  Patient presents with   Fall   Back Pain    Carl Harper is a 88 y.o. male.  Is an 88 year old male presenting emergency department for evaluation after a fall.  Was unwitnessed, but was found by wife on backside with did not in the wall.  He is on Eliquis.  Complains of pain to his low back.  No chest pain, no shortness of breath, no abdominal pain.  Denies saddle anesthesia, no weakness in lower extremities.  He does not recall specifics surrounding the fall, does have underlying dementia, wife at bedside states that he is at his baseline.  Has a history of frequent falls.  States that he lost balance   Fall  Back Pain      Home Medications Prior to Admission medications   Medication Sig Start Date End Date Taking? Authorizing Provider  alendronate (FOSAMAX) 70 MG tablet Take 70 mg by mouth every Sunday. 04/12/20   [provider]  apixaban (ELIQUIS) 2.5 MG TABS tablet Take 1 tablet (2.5 mg total) by mouth 2 (two) times daily. 10/30/22   Chrystie Nose, MD  aspirin 81 MG tablet Take 81 mg by mouth daily.    [provider]  Calcium Citrate 250 MG TABS Take 1-2 tablets by mouth See admin instructions. 2 tabs twice daily, 1 tab at noon    [provider]  Cholecalciferol (VITAMIN D3) 50 MCG (2000 UT) CAPS Take 2,000 Units by mouth daily.    [provider]  cycloSPORINE (RESTASIS) 0.05 % ophthalmic emulsion Place one drop into both eyes 2 (two) times daily. 05/11/17   [provider]  diclofenac Sodium (VOLTAREN) 1 % GEL SMARTSIG:2 Topical Twice Daily 06/22/21   [provider]  furosemide (LASIX) 40 MG tablet Take 1 tablet (40 mg total) by mouth 2 (two) times daily. 07/11/22   Hilty, Lisette Abu, MD  ipratropium (ATROVENT) 0.03 % nasal spray  06/12/20   [provider]  losartan (COZAAR) 25 MG tablet TAKE ONE TABLET BY MOUTH ONE TIME DAILY 11/28/22   Hilty, Lisette Abu, MD  Multiple Vitamin (MULTIVITAMIN WITH MINERALS) TABS tablet Take 1 tablet by mouth daily.    [provider]  Multiple Vitamins-Minerals (PRESERVISION AREDS) CAPS Take 1 capsule by mouth in the morning and at bedtime.    [provider]  nitroGLYCERIN (NITROSTAT) 0.4 MG SL tablet Place 1 tablet (0.4 mg total) under the tongue every 5 (five) minutes as needed for chest pain. 09/14/22   Hilty, Lisette Abu, MD  pramipexole (MIRAPEX) 0.75 MG tablet Take 0.75 mg by mouth daily. 02/04/18   [provider]  Tamsulosin HCl (FLOMAX) 0.4 MG CAPS Take 0.4 mg by mouth daily.    [provider]  traZODone (DESYREL) 100 MG tablet Take 100 mg by mouth at bedtime. 12/19/15   [provider]  XIIDRA 5 % SOLN Place 5 % into both eyes 2 (two) times daily. 05/28/20   [provider]      Allergies    Amiodarone and Bee venom    Review of Systems   Review of Systems  Musculoskeletal:  Positive for back pain.    Physical Exam Updated Vital Signs BP 121/63   Pulse 67   Temp 97.7 F (36.5 C) (Oral)   Resp 18  SpO2 94%  Physical Exam Vitals and nursing [name].  Constitutional:      General: He is not in acute distress.    Appearance: He is not toxic-appearing.  HENT:     Head: Normocephalic and atraumatic.     Mouth/Throat:     Mouth: Mucous membranes are moist.  Eyes:     Conjunctiva/sclera: Conjunctivae normal.  Cardiovascular:     Rate and Rhythm: Normal rate and regular rhythm.  Pulmonary:     Effort: Pulmonary effort is normal.     Breath sounds: Normal breath sounds.  Abdominal:     General: There is no distension.     Tenderness: There is no abdominal tenderness. There is no guarding or rebound.  Musculoskeletal:     Comments: Chest wall stable nontender.  Pelvis stable nontender.  5 out of 5 plantarflexion  dorsiflexion.  No bony tenderness to upper extremities.  2+ radial pulses, he does have impressive lower extremity edema bilaterally.   Skin:    General: Skin is warm.     Capillary Refill: Capillary refill takes less than 2 seconds.  Neurological:     Mental Status: He is alert and oriented to person, place, and time.  Psychiatric:        Mood and Affect: Mood normal.        Behavior: Behavior normal.     ED Results / Procedures / Treatments   Labs (all labs ordered are listed, but only abnormal results are displayed) Labs Reviewed  CBC - Abnormal; Notable for the following components:      Result Value   WBC 11.5 (*)    RBC 4.15 (*)    Hemoglobin 11.7 (*)    HCT 36.8 (*)    All other components within normal limits  BASIC METABOLIC PANEL - Abnormal; Notable for the following components:   BUN 31 (*)    Creatinine, Ser 1.31 (*)    Calcium 8.6 (*)    GFR, Estimated 53 (*)    All other components within normal limits    EKG EKG Interpretation Date/Time:  Wednesday January 31 2023 08:02:58 EST Ventricular Rate:  70 PR Interval:    QRS Duration:  113 QT Interval:  423 QTC Calculation: 457 R Axis:   -50  Text Interpretation: Atrial flutter with predominant 4:1 AV block Incomplete left bundle branch block Anterior Q waves, possibly due to ILBBB Confirmed by Estanislado Pandy [phone]) on 01/31/2023 11:46:41 AM  Radiology DG Pelvis 1-2 Views  Result Date: 01/31/2023 CLINICAL DATA:  Fall with lower back pain EXAM: PELVIS - 1 VIEW COMPARISON:  07/23/2017 FINDINGS: No evidence of hip fracture or dislocation. No evidence of pelvic ring fracture or diastasis. Lower lumbar degenerative endplate spurring. IMPRESSION: No acute finding. Electronically Signed   By: Tiburcio Pea M.D.   On: 01/31/2023 09:50   CT Head Wo Contrast  Result Date: 01/31/2023 CLINICAL DATA:  Right history: Head trauma, minor. Neck trauma. Additional history provided: Fall (with head trauma). On blood  thinners. History of stroke. Low back pain. EXAM: CT HEAD WITHOUT CONTRAST CT CERVICAL SPINE WITHOUT CONTRAST TECHNIQUE: Multidetector CT imaging of the head and cervical spine was performed following the standard protocol without intravenous contrast. Multiplanar CT image reconstructions of the cervical spine were also generated. RADIATION DOSE REDUCTION: This exam was performed according to the departmental dose-optimization program which includes automated exposure control, adjustment of the mA and/or kV according to patient size and/or use of iterative reconstruction technique. COMPARISON:  Report from head CT 04/27/2022.  Head 01/20/2015. FINDINGS: CT HEAD FINDINGS Mildly motion degraded exam. Within this limitation, findings are as follows. Brain: Cerebral atrophy. Mild-to-moderate dilation of the lateral and third ventricles, similar to the prior head CT of 04/27/2022. Acute callosal angle with some crowding of the sulci at the vertex. Patchy and ill-defined hypoattenuation within the cerebral white matter, nonspecific but compatible with moderate chronic small vessel ischemic disease. There is no acute intracranial hemorrhage. No demarcated cortical infarct. No extra-axial fluid collection. No evidence of an intracranial mass. No midline shift. Vascular: No hyperdense vessel.  Atherosclerotic calcifications. Skull: No calvarial fracture or aggressive osseous lesion. Sinuses/Orbits: No mass or acute finding within the imaged orbits. Other: Small-volume frothy secretions within the right frontal sinus. Mild mucosal thickening within the bilateral ethmoid, left sphenoid and bilateral maxillary sinuses. CT CERVICAL SPINE FINDINGS Alignment: Nonspecific straightening of the expected cervical lordosis. 2 mm C4-C5 grade 1 anterolisthesis. Skull base and vertebrae: The basion-dental and atlanto-dental intervals are maintained.No evidence of acute fracture to the cervical spine. Vertebral body and facet ankylosis at  C3-C4. Facet ankylosis on the left at C6-C7. Soft tissues and spinal canal: No prevertebral fluid or swelling. No visible canal hematoma. Disc levels: Cervical spondylosis with multilevel disc space narrowing, disc bulges/central disc protrusions, posterior disc osteophyte complexes, endplate spurring, uncovertebral hypertrophy and facet arthrosis. At the non-fused levels, disc space narrowing is greatest at C5-C6 and C6-C7 (advanced at these levels). At C5-C6, a posterior disc osteophyte complex contributes to up to moderate spinal canal stenosis. Multilevel bony neural foraminal narrowing. Multilevel ventral osteophytes, most prominent at C4-C5 and C5-C6. Upper chest: No consolidation within the imaged lung apices. No visible pneumothorax. IMPRESSION: CT head: 1. Mildly motion degraded exam. 2. No acute post-traumatic intracranial findings. 3. Moderate chronic small vessel changes within the cerebral white matter. 4. Mild-to-moderate dilation of the lateral and third ventricles. This could reflect central predominant atrophy or, in the appropriate clinical setting, normal pressure hydrocephalus (NPH). 5. Paranasal sinus disease as described. CT cervical spine: 1. No evidence of an acute cervical spine fracture. 2. Nonspecific straightening of the expected cervical lordosis. 3. 2 mm C4-C5 grade 1 anterolisthesis. 4. Cervical spondylosis as described. 5. Multilevel vertebral ankylosis. Electronically Signed   By: Jackey Loge D.O.   On: 01/31/2023 09:40   CT Cervical Spine Wo Contrast  Result Date: 01/31/2023 CLINICAL DATA:  Right history: Head trauma, minor. Neck trauma. Additional history provided: Fall (with head trauma). On blood thinners. History of stroke. Low back pain. EXAM: CT HEAD WITHOUT CONTRAST CT CERVICAL SPINE WITHOUT CONTRAST TECHNIQUE: Multidetector CT imaging of the head and cervical spine was performed following the standard protocol without intravenous contrast. Multiplanar CT image  reconstructions of the cervical spine were also generated. RADIATION DOSE REDUCTION: This exam was performed according to the departmental dose-optimization program which includes automated exposure control, adjustment of the mA and/or kV according to patient size and/or use of iterative reconstruction technique. COMPARISON:  Report from head CT 04/27/2022.  Head 01/20/2015. FINDINGS: CT HEAD FINDINGS Mildly motion degraded exam. Within this limitation, findings are as follows. Brain: Cerebral atrophy. Mild-to-moderate dilation of the lateral and third ventricles, similar to the prior head CT of 04/27/2022. Acute callosal angle with some crowding of the sulci at the vertex. Patchy and ill-defined hypoattenuation within the cerebral white matter, nonspecific but compatible with moderate chronic small vessel ischemic disease. There is no acute intracranial hemorrhage. No demarcated cortical infarct. No extra-axial fluid collection. No evidence  of an intracranial mass. No midline shift. Vascular: No hyperdense vessel.  Atherosclerotic calcifications. Skull: No calvarial fracture or aggressive osseous lesion. Sinuses/Orbits: No mass or acute finding within the imaged orbits. Other: Small-volume frothy secretions within the right frontal sinus. Mild mucosal thickening within the bilateral ethmoid, left sphenoid and bilateral maxillary sinuses. CT CERVICAL SPINE FINDINGS Alignment: Nonspecific straightening of the expected cervical lordosis. 2 mm C4-C5 grade 1 anterolisthesis. Skull base and vertebrae: The basion-dental and atlanto-dental intervals are maintained.No evidence of acute fracture to the cervical spine. Vertebral body and facet ankylosis at C3-C4. Facet ankylosis on the left at C6-C7. Soft tissues and spinal canal: No prevertebral fluid or swelling. No visible canal hematoma. Disc levels: Cervical spondylosis with multilevel disc space narrowing, disc bulges/central disc protrusions, posterior disc osteophyte  complexes, endplate spurring, uncovertebral hypertrophy and facet arthrosis. At the non-fused levels, disc space narrowing is greatest at C5-C6 and C6-C7 (advanced at these levels). At C5-C6, a posterior disc osteophyte complex contributes to up to moderate spinal canal stenosis. Multilevel bony neural foraminal narrowing. Multilevel ventral osteophytes, most prominent at C4-C5 and C5-C6. Upper chest: No consolidation within the imaged lung apices. No visible pneumothorax. IMPRESSION: CT head: 1. Mildly motion degraded exam. 2. No acute post-traumatic intracranial findings. 3. Moderate chronic small vessel changes within the cerebral white matter. 4. Mild-to-moderate dilation of the lateral and third ventricles. This could reflect central predominant atrophy or, in the appropriate clinical setting, normal pressure hydrocephalus (NPH). 5. Paranasal sinus disease as described. CT cervical spine: 1. No evidence of an acute cervical spine fracture. 2. Nonspecific straightening of the expected cervical lordosis. 3. 2 mm C4-C5 grade 1 anterolisthesis. 4. Cervical spondylosis as described. 5. Multilevel vertebral ankylosis. Electronically Signed   By: Jackey Loge D.O.   On: 01/31/2023 09:40   CT Lumbar Spine Wo Contrast  Result Date: 01/31/2023 CLINICAL DATA:  Back trauma, no prior imaging (Age >= 16y) EXAM: CT LUMBAR SPINE WITHOUT CONTRAST TECHNIQUE: Multidetector CT imaging of the lumbar spine was performed without intravenous contrast administration. Multiplanar CT image reconstructions were also generated. RADIATION DOSE REDUCTION: This exam was performed according to the departmental dose-optimization program which includes automated exposure control, adjustment of the mA and/or kV according to patient size and/or use of iterative reconstruction technique. COMPARISON:  None Available. FINDINGS: Segmentation: Lumbarization of S1 with last well-formed disc space labeled S1-S2 Alignment: Normal. Vertebrae: There is  an acute nondisplaced fracture through the anterior superior endplate of L5 on the left (series 6, image 41). There is likely also an additional nondisplaced fracture through the right lateral aspect of the L1 vertebral body (series 6, image 38) There are acute fractures through the bilateral sacral ala. There is also an age indeterminate fracture of the posterior elements of S3 (series 8, image 49). Paraspinal and other soft tissues: Aortic atherosclerotic calcifications. Right common iliac artery stent in place. Disc levels: There is severe spinal canal stenosis at L4-L5 and L5-S1 IMPRESSION: 1. Acute nondisplaced fracture through the anterior superior endplate of L5 on the left. 2. Likely additional nondisplaced fracture through the right lateral aspect of the L1 vertebral body. 3. Acute fractures through the bilateral sacral ala. 4. Age indeterminate fracture of the posterior elements of S3. 5. Severe spinal canal stenosis at L4-L5 and L5-S1. Consider further evaluation with a lumbar spine MRI. Aortic Atherosclerosis (ICD10-I70.0). Electronically Signed   By: Lorenza Cambridge M.D.   On: 01/31/2023 09:40   DG Chest Portable 1 View  Result Date: 01/31/2023 CLINICAL  DATA:  Fall EXAM: PORTABLE CHEST 1 VIEW COMPARISON:  04/27/2022 FINDINGS: Stable cardiomediastinal contours. Aortic atherosclerosis. No focal airspace consolidation, pleural effusion, or pneumothorax. IMPRESSION: No active disease. Electronically Signed   By: Duanne Guess D.O.   On: 01/31/2023 08:49    Procedures Procedures    Medications Ordered in ED Medications  lidocaine (LIDODERM) 5 % 1 patch (1 patch Transdermal Patch Applied 01/31/23 0841)  acetaminophen (TYLENOL) tablet 1,000 mg (1,000 mg Oral Given 01/31/23 0836)  oxyCODONE-acetaminophen (PERCOCET/ROXICET) 5-325 MG per tablet 1 tablet (1 tablet Oral Given 01/31/23 1208)    ED Course/ Medical Decision Making/ A&P Clinical Course as of 01/31/23 1354  Wed Jan 31, 2023  1024  CT head and neck : CT head:  1. Mildly motion degraded exam. 2. No acute post-traumatic intracranial findings. 3. Moderate chronic small vessel changes within the cerebral white matter. 4. Mild-to-moderate dilation of the lateral and third ventricles. This could reflect central predominant atrophy or, in the appropriate clinical setting, normal pressure hydrocephalus (NPH). 5. Paranasal sinus disease as described.  CT cervical spine:  1. No evidence of an acute cervical spine fracture. 2. Nonspecific straightening of the expected cervical lordosis. 3. 2 mm C4-C5 grade 1 anterolisthesis. 4. Cervical spondylosis as described. 5. Multilevel vertebral ankylosis.   [TY]  1025 CT Lumbar Spine Wo Contrast IMPRESSION: 1. Acute nondisplaced fracture through the anterior superior endplate of L5 on the left. 2. Likely additional nondisplaced fracture through the right lateral aspect of the L1 vertebral body. 3. Acute fractures through the bilateral sacral ala. 4. Age indeterminate fracture of the posterior elements of S3. 5. Severe spinal canal stenosis at L4-L5 and L5-S1. Consider further evaluation with a lumbar spine MRI.  Aortic Atherosclerosis (ICD10-I70.0).   Electronically Signed   [TY]  1218 Case discussed with NSGY; [place]. Recommended brace, no acute intervention currently. Will look at scans and leave Harper.  [TY]  1227 Case discussed with Ortho; they will reach out to trauma Ortho to review images, but no acute intervention planned at this time. [TY]    Clinical Course User Index [TY] Coral Spikes, DO                                 Medical Decision Making 88 year old male presenting emergency department after fall.  Afebrile vital signs reassuring.  Physical exam largely reassuring.  CT scans revealed lumbar fractures and sacral ala fractures.  Neurosurgery recommended bracing, but no acute surgical interventions planned.  Case discussed with Ortho who spoke with  Ortho trauma; recommending pain control PT OT and if patient has persistent pain and unable to ambulate will do surgery on Friday.  Basic labs ordered minor elevation in creatinine appears to be at baseline.  Mild leukocytosis.  Reactionary?  Amount and/or Complexity of Data Reviewed Independent Historian:     Details: Spouse notes frequent falls. External Data Reviewed:     Details: Per chart review is on Eliquis Labs: ordered. Decision-making details documented in ED Course. Radiology: ordered. Decision-making details documented in ED Course. ECG/medicine tests: ordered. Discussion of management or test interpretation with external provider(s): Spoke with neurosurgery and with Ortho.  See ED course.  Risk OTC drugs. Prescription drug management. Parenteral controlled substances. Decision regarding hospitalization.         Final Clinical Impression(s) / ED Diagnoses Final diagnoses:  Closed fracture of first lumbar vertebra, unspecified fracture morphology, initial encounter (HCC)  Closed fracture  of sacrum, unspecified portion of sacrum, initial encounter [facility])    Rx / DC Orders ED Discharge Orders     None         Coral Spikes, DO 01/31/23 1354

## 2023-02-01 ENCOUNTER — Other Ambulatory Visit: Payer: Self-pay

## 2023-02-01 DIAGNOSIS — D72829 Elevated white blood cell count, unspecified: Secondary | ICD-10-CM | POA: Diagnosis present

## 2023-02-01 DIAGNOSIS — F0390 Unspecified dementia without behavioral disturbance: Secondary | ICD-10-CM | POA: Diagnosis present

## 2023-02-01 DIAGNOSIS — I2510 Atherosclerotic heart disease of native coronary artery without angina pectoris: Secondary | ICD-10-CM | POA: Diagnosis present

## 2023-02-01 DIAGNOSIS — I480 Paroxysmal atrial fibrillation: Secondary | ICD-10-CM | POA: Diagnosis present

## 2023-02-01 DIAGNOSIS — I130 Hypertensive heart and chronic kidney disease with heart failure and stage 1 through stage 4 chronic kidney disease, or unspecified chronic kidney disease: Secondary | ICD-10-CM | POA: Diagnosis present

## 2023-02-01 DIAGNOSIS — Z888 Allergy status to other drugs, medicaments and biological substances status: Secondary | ICD-10-CM | POA: Diagnosis not present

## 2023-02-01 DIAGNOSIS — I5032 Chronic diastolic (congestive) heart failure: Secondary | ICD-10-CM | POA: Diagnosis present

## 2023-02-01 DIAGNOSIS — I700 Atherosclerosis of aorta: Secondary | ICD-10-CM | POA: Diagnosis present

## 2023-02-01 DIAGNOSIS — Z8249 Family history of ischemic heart disease and other diseases of the circulatory system: Secondary | ICD-10-CM | POA: Diagnosis not present

## 2023-02-01 DIAGNOSIS — M545 Low back pain, unspecified: Secondary | ICD-10-CM | POA: Diagnosis present

## 2023-02-01 DIAGNOSIS — S32119A Unspecified Zone I fracture of sacrum, initial encounter for closed fracture: Secondary | ICD-10-CM | POA: Diagnosis present

## 2023-02-01 DIAGNOSIS — Z79899 Other long term (current) drug therapy: Secondary | ICD-10-CM | POA: Diagnosis not present

## 2023-02-01 DIAGNOSIS — Y92019 Unspecified place in single-family (private) house as the place of occurrence of the external cause: Secondary | ICD-10-CM | POA: Diagnosis not present

## 2023-02-01 DIAGNOSIS — Z87891 Personal history of nicotine dependence: Secondary | ICD-10-CM | POA: Diagnosis not present

## 2023-02-01 DIAGNOSIS — N401 Enlarged prostate with lower urinary tract symptoms: Secondary | ICD-10-CM | POA: Diagnosis present

## 2023-02-01 DIAGNOSIS — Z7901 Long term (current) use of anticoagulants: Secondary | ICD-10-CM | POA: Diagnosis not present

## 2023-02-01 DIAGNOSIS — E785 Hyperlipidemia, unspecified: Secondary | ICD-10-CM | POA: Diagnosis present

## 2023-02-01 DIAGNOSIS — S32019A Unspecified fracture of first lumbar vertebra, initial encounter for closed fracture: Secondary | ICD-10-CM | POA: Diagnosis present

## 2023-02-01 DIAGNOSIS — G4733 Obstructive sleep apnea (adult) (pediatric): Secondary | ICD-10-CM | POA: Diagnosis present

## 2023-02-01 DIAGNOSIS — G2581 Restless legs syndrome: Secondary | ICD-10-CM | POA: Diagnosis present

## 2023-02-01 DIAGNOSIS — Z9181 History of falling: Secondary | ICD-10-CM | POA: Diagnosis not present

## 2023-02-01 DIAGNOSIS — Z91030 Bee allergy status: Secondary | ICD-10-CM | POA: Diagnosis not present

## 2023-02-01 DIAGNOSIS — S32059A Unspecified fracture of fifth lumbar vertebra, initial encounter for closed fracture: Secondary | ICD-10-CM | POA: Diagnosis present

## 2023-02-01 DIAGNOSIS — D631 Anemia in chronic kidney disease: Secondary | ICD-10-CM | POA: Diagnosis present

## 2023-02-01 DIAGNOSIS — N1832 Chronic kidney disease, stage 3b: Secondary | ICD-10-CM | POA: Diagnosis present

## 2023-02-01 DIAGNOSIS — I4892 Unspecified atrial flutter: Secondary | ICD-10-CM | POA: Diagnosis present

## 2023-02-01 DIAGNOSIS — S3210XA Unspecified fracture of sacrum, initial encounter for closed fracture: Secondary | ICD-10-CM | POA: Diagnosis not present

## 2023-02-01 DIAGNOSIS — W19XXXA Unspecified fall, initial encounter: Secondary | ICD-10-CM | POA: Diagnosis present

## 2023-02-01 LAB — CBC
HCT: 38.1 % — ABNORMAL LOW (ref 39.0–52.0)
Hemoglobin: 12.4 g/dL — ABNORMAL LOW (ref 13.0–17.0)
MCH: 27.9 pg (ref 26.0–34.0)
MCHC: 32.5 g/dL (ref 30.0–36.0)
MCV: 85.6 fL (ref 80.0–100.0)
Platelets: 332 10*3/uL (ref 150–400)
RBC: 4.45 MIL/uL (ref 4.22–5.81)
RDW: 14.3 % (ref 11.5–15.5)
WBC: 14.5 10*3/uL — ABNORMAL HIGH (ref 4.0–10.5)
nRBC: 0 % (ref 0.0–0.2)

## 2023-02-01 LAB — BASIC METABOLIC PANEL
Anion gap: 10 (ref 5–15)
BUN: 21 mg/dL (ref 8–23)
CO2: 21 mmol/L — ABNORMAL LOW (ref 22–32)
Calcium: 8.8 mg/dL — ABNORMAL LOW (ref 8.9–10.3)
Chloride: 108 mmol/L (ref 98–111)
Creatinine, Ser: 1.42 mg/dL — ABNORMAL HIGH (ref 0.61–1.24)
GFR, Estimated: 48 mL/min — ABNORMAL LOW (ref >60)
Glucose, Bld: 140 mg/dL — ABNORMAL HIGH (ref 70–99)
Potassium: 4.2 mmol/L (ref 3.5–5.1)
Sodium: 139 mmol/L (ref 135–145)

## 2023-02-01 MED ORDER — ACETAMINOPHEN 500 MG PO TABS
1000.0000 mg | ORAL_TABLET | Freq: Three times a day (TID) | ORAL | Status: DC
  Administered 2023-02-01 – 2023-02-03 (×6): 1000 mg via ORAL
  Filled 2023-02-01 (×6): qty 2

## 2023-02-01 MED ORDER — APIXABAN 2.5 MG PO TABS
2.5000 mg | ORAL_TABLET | Freq: Two times a day (BID) | ORAL | Status: DC
  Administered 2023-02-01 – 2023-02-03 (×5): 2.5 mg via ORAL
  Filled 2023-02-01 (×5): qty 1

## 2023-02-01 NOTE — Evaluation (Signed)
Physical Therapy Evaluation Patient Details Name: CAMRON ORSAK MRN: [id number] DOB: 06/24/1934 Today's Date: 02/01/2023  History of Present Illness  88 y.o. male who came into the emergency department 01/31/23 after having an unwitnessed fall at home hitting the back of the head on a wall, but no LOC.  CT lumbar spine showing acute nondisplaced fracture through the anterior superior endplate of L5 on the left.  Likely additional nondisplaced fracture through the right lateral aspect of the L1 vertebral body.  Acute fractures through the bilateral sacral ala.  PMH significant of benign neoplasm of the colon, BPH, chronic lower back pain, daily headaches, diverticulosis, hemorrhoids, memory loss, restless leg syndrome, sleep apnea no longer on CPAP after weight loss, hypertension, stage 3b CKD, grade 1 diastolic dysfunction, PAD, atrial fibrillation/flutter on apixaban  Clinical Impression   Pt admitted secondary to problem above with deficits below. PTA patient was living at home with wife and using a RW to walk (per chart). He had multiple falls. Pt currently requires mod assist for bed mobility and min assist for transfers with RW. Patient is confused and no family available for home set-up information or to discuss ?discharge plan. Would be most beneficial cognitively for him to return home, however unsure if wife can manage him.  Anticipate patient will benefit from PT to address problems listed below.Will continue to follow acutely to maximize functional mobility independence and safety.           If plan is discharge home, recommend the following: A little help with walking and/or transfers;Assistance with cooking/housework;Direct supervision/assist for medications management;Direct supervision/assist for financial management;Assist for transportation;Help with stairs or ramp for entrance;Supervision due to cognitive status   Can travel by private vehicle        Equipment Recommendations  Other (comment) (TBD)  Recommendations for Other Services  Rehab consult    Functional Status Assessment Patient has had a recent decline in their functional status and demonstrates the ability to make significant improvements in function in a reasonable and predictable amount of time.     Precautions / Restrictions Precautions Precautions: Fall Precaution Comments: multiple recent falls; pt cannot recount how      Mobility  Bed Mobility Overal bed mobility: Needs Assistance Bed Mobility: Rolling, Sidelying to Sit Rolling: Min assist, Used rails Sidelying to sit: Mod assist, Used rails       General bed mobility comments: verbal and tactile cues for sequencing    Transfers Overall transfer level: Needs assistance Equipment used: Rolling walker (2 wheels) Transfers: Sit to/from Stand, Bed to chair/wheelchair/BSC Sit to Stand: Min assist   Step pivot transfers: Min assist       General transfer comment: stands with trunk flexion; able to advance feet on his own; advances RW on his own    Ambulation/Gait               General Gait Details: pivotal steps wiht RW  Stairs            Wheelchair Mobility     Tilt Bed    Modified Rankin (Stroke Patients Only)       Balance Overall balance assessment: History of Falls                                           Pertinent Vitals/Pain Pain Assessment Pain Assessment: Faces Faces Pain Scale: Hurts little more Pain  Location: back Pain Descriptors / Indicators: Discomfort, Guarding Pain Intervention(s): Limited activity within patient's tolerance, Monitored during session    Home Living Family/patient expects to be discharged to:: Private residence Living Arrangements: Spouse/significant other                 Additional Comments: pt unable to provide accurate history due to confusion    Prior Function Prior Level of Function : History of Falls (last six months);Patient poor  historian/Family not available             Mobility Comments: per chart, was using RW to ambulate       Extremity/Trunk Assessment   Upper Extremity Assessment Upper Extremity Assessment: Defer to OT evaluation    Lower Extremity Assessment Lower Extremity Assessment: Generalized weakness;RLE deficits/detail;LLE deficits/detail;Difficult to assess due to impaired cognition RLE: Unable to fully assess due to pain LLE: Unable to fully assess due to pain    Cervical / Trunk Assessment Cervical / Trunk Assessment: Other exceptions;Kyphotic  Communication   Communication Communication: No apparent difficulties Cueing Techniques: Verbal cues;Tactile cues  Cognition Arousal: Alert Behavior During Therapy: Restless, Anxious Overall Cognitive Status: No family/caregiver present to determine baseline cognitive functioning                                 General Comments: thinks he is at home and wondering where his wife is; thinks he hears her in the next room; when oriented to hospital, he is able to state that he came in because of multiple falls (could not state injuries); oriented to Dec, 2004        General Comments      Exercises     Assessment/Plan    PT Assessment Patient needs continued PT services  PT Problem List Decreased strength;Decreased balance;Decreased mobility;Decreased cognition;Decreased knowledge of use of DME;Decreased safety awareness;Pain       PT Treatment Interventions DME instruction;Gait training;Stair training;Functional mobility training;Therapeutic activities;Therapeutic exercise;Balance training;Cognitive remediation;Patient/family education    PT Goals (Current goals can be found in the Care Plan section)  Acute Rehab PT Goals Patient Stated Goal: to go home PT Goal Formulation: Patient unable to participate in goal setting Time For Goal Achievement: 02/15/23 Potential to Achieve Goals: Good    Frequency Min 1X/week      Co-evaluation               AM-PAC PT "6 Clicks" Mobility  Outcome Measure Help needed turning from your back to your side while in a flat bed without using bedrails?: A Little Help needed moving from lying on your back to sitting on the side of a flat bed without using bedrails?: A Lot Help needed moving to and from a bed to a chair (including a wheelchair)?: A Little Help needed standing up from a chair using your arms (e.g., wheelchair or bedside chair)?: A Little Help needed to walk in hospital room?: Total (<20 ft) Help needed climbing 3-5 steps with a railing? : Total 6 Click Score: 13    End of Session Equipment Utilized During Treatment: Gait belt Activity Tolerance: Patient tolerated treatment well Patient left: in chair;with call bell/phone within reach;with chair alarm set;with nursing/sitter in room Nurse Communication: Mobility status PT Visit Diagnosis: Other abnormalities of gait and mobility (R26.89);Repeated falls (R29.6);Muscle weakness (generalized) (M62.81)    Time: 1610-9604 PT Time Calculation (min) (ACUTE ONLY): 28 min   Charges:   PT Evaluation $PT  Eval Low Complexity: 1 Low PT Treatments $Gait Training: 8-22 mins PT General Charges $$ ACUTE PT VISIT: 1 Visit          Jerolyn Center, PT Acute Rehabilitation Services  Office [phone]   Zena Amos 02/01/2023, 3:38 PM

## 2023-02-01 NOTE — Progress Notes (Signed)
PROGRESS NOTE  Carl Harper  DOB: 21-May-1934  PCP: Tracey Harries, MD WUJ:[id number]  DOA: 01/31/2023  LOS: 0 days  Hospital Day: 2  Brief narrative: Carl Harper is a 88 y.o. male with PMH significant for HTN, HLD, A-fib/flutter on Eliquis PAD, CKD, sleep apnea, BPH, diverticulosis, restless legs, dementia. At baseline, patient lives at home with his wife.  He ambulates with a rolling walker and suffers frequent falls. 12/11, patient was brought to the ED from home after a fall.  Patient had an unwitnessed fall where he hit the back of his head, did not pass out.  Also had significant trauma to the lower back.  No prodromal symptoms of chest pain, palpitation, dizziness.  In the ED, hemodynamically stable CT head did not show any acute abnormality, showed moderate chronic small vessel ischemic changes, mild to moderate dilation of the lateral and third ventricles.   CT cervical spine did not show any evidence of an acute cervical spine fracture.  There is a 2 mm C4-C5 grade 1 anterolisthesis.  Cervical spondylosis.  Multiple vertebral ankylosis.   CT lumbar spine showed acute nondisplaced fracture through the anterior superior endplate of L5 on the left.  Likely additional nondisplaced fracture through the right lateral aspect of the L1 vertebral body.  Acute fractures through the bilateral sacral ala.  Age-indeterminate fracture of the posterior elements of S3.  Severe spinal canal stenosis at L4-L5 and L5-S1.    EDP discussed the case with orthopedics Dr. Odis Hollingshead.  Recommended PT eval, pain control.  If fails, or if sacrum could be an option. Admitted to [facility]  Subjective: Patient was seen and examined this morning.  Pleasant elderly Caucasian male.  Propped up in bed.  Trying to put weight off the fractured sacrum.  Received IV Dilaudid prior to my eval.  Wife at bedside.  Assessment and plan: Closed sacral fracture Secondary to a fall CT lumbar spine findings as above PT  eval ordered for mobilization with weightbearing as tolerated bilateral lower extremities. Currently on pain control with scheduled Tylenol, as needed oxycodone and as needed IV Dilaudid if no improvement in pain, orthopedics plans for screw fixation. Continue to monitor.  Closed L5 vertebral fracture EDP discussed with neurosurgery Dr. Franky Macho. No surgical intervention at this time.  PAD Aortic atherosclerosis Chronically anticoagulated with Eliquis.   HTN (hypertension) Chronic diastolic dysfunction Continue losartan 25 mg p.o. daily.  Lasix on hold. BP soft after analgesics.    Paroxysmal atrial fibrillation CHA?DS?-VASc Score of at least 5.  Eliquis can be resumed per orthopedics.  CAD No angina. Nitroglycerin as needed. Resume apixaban in the future. Not on statin. Follow-up with PCP and cardiology as an outpatient.   BPH Continue Flomax and Proscar.  OSA on CPAP No longer on CPAP   Restless legs syndrome Continue pramipexole 1.5 mg p.o. bedtime.   CKD 3B  Monitor renal function and electrolytes. Recent Labs    07/20/22 1352 01/31/23 1219 02/01/23 1008  BUN 39* 31* 21  CREATININE 1.52* 1.31* 1.42*  CO2 23 22 21*   Normocytic anemia Monitor hematocrit and hemoglobin. Recent Labs    01/31/23 1219 02/01/23 1008  HGB 11.7* 12.4*  MCV 88.7 85.6    Mobility: Encourage ambulation  Goals of care   Code Status: Full Code     DVT prophylaxis: Eliquis resumed apixaban (ELIQUIS) tablet 2.5 mg Start: 02/01/23 1400 apixaban (ELIQUIS) tablet 2.5 mg   Antimicrobials: None Fluid: None Consultants: Orthopedics Family Communication: Wife at bedside  Status: Inpatient Level of care:  Telemetry Medical   Patient is from: Home Needs to continue in-hospital care: For pain management.  Possible need of surgical intervention Anticipated d/c to: Pending clinical course   Diet:  Diet Order             Diet Heart Room service appropriate? Yes; Fluid  consistency: Thin  Diet effective now                   Scheduled Meds:  acetaminophen  1,000 mg Oral TID   apixaban  2.5 mg Oral BID   ciprofloxacin  250 mg Oral BID   cycloSPORINE  1 drop Both Eyes BID   finasteride  5 mg Oral Daily   losartan  25 mg Oral Daily   pramipexole  1.5 mg Oral QHS   senna-docusate  1 tablet Oral BID   tamsulosin  0.4 mg Oral Daily    PRN meds: HYDROmorphone (DILAUDID) injection, magnesium hydroxide, oxyCODONE   Infusions:    Antimicrobials: Anti-infectives (From admission, onward)    Start     Dose/Rate Route Frequency Ordered Stop   01/31/23 2000  ciprofloxacin (CIPRO) tablet 250 mg        250 mg Oral 2 times daily 01/31/23 1615         Objective: Vitals:   02/01/23 0325 02/01/23 0848  BP: 130/70 128/65  Pulse: 71 81  Resp: 18 20  Temp: 97.7 F (36.5 C) 97.9 F (36.6 C)  SpO2: 90% 91%    Intake/Output Summary (Last 24 hours) at 02/01/2023 1314 Last data filed at 02/01/2023 0300 Gross per 24 hour  Intake --  Output 250 ml  Net -250 ml   Filed Weights   01/31/23 1700  Weight: 81.3 kg   Weight change:  Body mass index is 26.48 kg/m.   Physical Exam: General exam: Pleasant, elderly Caucasian male Skin: No rashes, lesions or ulcers. HEENT: Atraumatic, normocephalic, no obvious bleeding Lungs: Clear to auscultation bilaterally CVS: Regular rate and rhythm, no murmur GI/Abd soft, nontender, nondistended, bowel sound present Back: Midline tenderness present in sacral area CNS: Alert, awake, oriented x 3 Psychiatry: Mood appropriate Extremities: No pedal edema, no calf tenderness  Data Review: I have personally reviewed the laboratory data and studies available.  F/u labs ordered Unresulted Labs (From admission, onward)     Start     Ordered   02/02/23 0500  CBC with Differential/Platelet  Tomorrow morning,   R        02/01/23 1314   02/02/23 0500  Basic metabolic panel  Tomorrow morning,   R        02/01/23  1314           Total time spent in review of labs and imaging, patient evaluation, formulation of plan, documentation and communication with family: 45 minutes  Signed, Lorin Glass, MD [facility] 02/01/2023

## 2023-02-01 NOTE — Consult Note (Signed)
Reason for Consult:Pelvic fxs Referring Physician: Melina Schools Dahal Time called: 0730 Time at bedside: 0851   Carl Harper is an 88 y.o. male.  HPI: Carl Harper fell at home yesterday morning. He had significant head, back, and elbow pain and was brought to the ED at [facility]. Workup showed sacral fxs in addition to lumbar spine injuries and orthopedic surgery was consulted. He was transferred to [facility] in case he needed to undergo stabilization of his pelvis. He lives at home with his wife and ambulates with a RW. He sufferes frequent falls.  Past Medical History:  Diagnosis Date   Benign neoplasm of colon 10/24/2006   BPH (benign prostatic hyperplasia)    Daily headache    Diverticulosis 09/03.2008   Dysrhythmia    "irregular"   Grade I diastolic dysfunction 01/31/2023   Hypertension    Internal hemorrhoids without mention of complication 10/24/2006   Memory loss    PAD (peripheral artery disease) (HCC)    Restless leg syndrome    Sleep apnea    dx'd; wore mask; lost weight; turned in machine"    Past Surgical History:  Procedure Laterality Date   APPENDECTOMY  ~ 1954   ATHERECTOMY Right 11/07/2011   Procedure: ATHERECTOMY;  Surgeon: Runell Gess, MD;  [facility];  Service: Cardiovascular;  Laterality: Right;   CARDIAC CATHETERIZATION  03/30/1999   non-critical CAD   CARDIOVERSION N/A 04/19/2020   Procedure: CARDIOVERSION;  Surgeon: Pricilla Riffle, MD;  Location: [facility] ENDOSCOPY;  Service: Cardiovascular;  Laterality: N/A;   CATARACT EXTRACTION W/ INTRAOCULAR LENS  IMPLANT, BILATERAL  2011-2012   ILIAC ARTERY STENT Right 11/07/2011   Diamondback orbital rotational atherectomy, PTA & stenting of calcified R CIA (Dr. Erlene Quan)   LOWER EXTREMITY ANGIOGRAM N/A 11/07/2011   Procedure: LOWER EXTREMITY ANGIOGRAM;  Surgeon: Runell Gess, MD;  Location: [facility] CATH LAB;  Service: Cardiovascular;  Laterality: N/A;   NM MYOCAR PERF WALL MOTION  09/2012   lexiscan myoview - low risk with fixed  inferior defect with underlying bowel attenuation suggestive of artifact   PERCUTANEOUS STENT INTERVENTION  11/07/2011   Procedure: PERCUTANEOUS STENT INTERVENTION;  Surgeon: Runell Gess, MD;  Location: [facility] CATH LAB;  Service: Cardiovascular;;   SHOULDER ARTHROSCOPY W/ ROTATOR CUFF REPAIR Left 2010   Sleep Study  01/09/2011   AHI during total sleep 14.4/hr and during REM 19.4/hr   TRANSTHORACIC ECHOCARDIOGRAM  09/2012   EF 55-60%, LA mildly dilated    Family History  Problem Relation Age of Onset   Heart Problems Father 89   Cancer Brother 5   Heart failure Brother    Cancer Maternal Grandfather        prostate    Social History:  reports that he quit smoking about 28 years ago. His smoking use included cigarettes. He started smoking about 83 years ago. He has a 55 pack-year smoking history. He has never used smokeless tobacco. He reports current alcohol use of about 14.0 standard drinks of alcohol per week. He reports that he does not use drugs.  Allergies:  Allergies  Allergen Reactions   Bee Venom Anaphylaxis   Amiodarone Other (See Comments)    "Corneal deposits"    Medications: I have reviewed the patient's current medications.  Results for orders placed or performed during the hospital encounter of 01/31/23 (from the past 48 hours)  CBC     Status: Abnormal   Collection Time: 01/31/23 12:19 PM  Result Value Ref Range  WBC 11.5 (H) 4.0 - 10.5 K/uL   RBC 4.15 (L) 4.22 - 5.81 MIL/uL   Hemoglobin 11.7 (L) 13.0 - 17.0 g/dL   HCT 78.2 (L) 95.6 - 21.3 %   MCV 88.7 80.0 - 100.0 fL   MCH 28.2 26.0 - 34.0 pg   MCHC 31.8 30.0 - 36.0 g/dL   RDW 08.6 57.8 - 46.9 %   Platelets 306 150 - 400 K/uL   nRBC 0.0 0.0 - 0.2 %    Comment: Performed at [facility], [address] [place], [place] [address]  Basic metabolic panel     Status: Abnormal   Collection Time: 01/31/23 12:19 PM  Result Value Ref Range   Sodium 138 135 - 145 mmol/L   Potassium 3.9 3.5 -  5.1 mmol/L   Chloride 109 98 - 111 mmol/L   CO2 22 22 - 32 mmol/L   Glucose, Bld 97 70 - 99 mg/dL    Comment: Glucose reference range applies only to samples taken after fasting for at least 8 hours.   BUN 31 (H) 8 - 23 mg/dL   Creatinine, Ser 8.41 (H) 0.61 - 1.24 mg/dL   Calcium 8.6 (L) 8.9 - 10.3 mg/dL   GFR, Estimated 53 (L) >60 mL/min    Comment: (NOTE) Calculated using the CKD-EPI Creatinine Equation (2021)    Anion gap 7 5 - 15    Comment: Performed at [facility], [address] [place], [place] [address]    CT ABDOMEN PELVIS W CONTRAST Result Date: 01/31/2023 CLINICAL DATA:  Abdominal distension, constipation EXAM: CT ABDOMEN AND PELVIS WITH CONTRAST TECHNIQUE: Multidetector CT imaging of the abdomen and pelvis was performed using the standard protocol following bolus administration of intravenous contrast. RADIATION DOSE REDUCTION: This exam was performed according to the departmental dose-optimization program which includes automated exposure control, adjustment of the mA and/or kV according to patient size and/or use of iterative reconstruction technique. CONTRAST:  65mL OMNIPAQUE IOHEXOL 350 MG/ML SOLN COMPARISON:  07/23/2017 FINDINGS: Lower chest: Small bilateral pleural effusions with bilateral lower lobe atelectasis, including rounded atelectasis in the left lower lobe. Hepatobiliary: Liver is within normal limits. Gallbladder is unremarkable. No intrahepatic or extrahepatic ductal dilatation. Pancreas: 2.3 cm septated cystic lesion in the pancreatic head (series 3/image 29), previously 2.2 cm. Side branch IPMN was favored on MR. CT appearance could also suggest series cystadenoma. Regardless, this is essentially stable x5 years, benign. No follow-up is recommended given patient age greater than 22. Spleen: Within normal limits. Adrenals/Urinary Tract: Adrenal glands are within normal limits. Kidneys are within normal limits. Left renal vascular calcifications.  No renal calculi or hydronephrosis. Thick-walled, irregular bladder (series 3/image 67), suggesting cystitis. Stomach/Bowel: Stomach is within normal limits. No evidence of bowel obstruction. Appendix is not discretely visualized. Sigmoid diverticulosis, without evidence of diverticulitis. Vascular/Lymphatic: No evidence of abdominal aortic aneurysm. Atherosclerotic calcifications of the abdominal aorta and branch vessels, although vessels remain patent. No suspicious abdominopelvic lymphadenopathy. Reproductive: Prostatomegaly, suggesting BPH, with dystrophic calcifications. Other: No abdominopelvic ascites. Musculoskeletal: Degenerative changes of the visualized thoracolumbar spine. IMPRESSION: No evidence of bowel obstruction. Thick-walled, irregular bladder, suggesting cystitis. Correlate with urinalysis. Small bilateral pleural effusions with bilateral lower lobe atelectasis. Additional ancillary findings as above. Electronically Signed   By: Charline Bills M.D.   On: 01/31/2023 21:28   DG Pelvis 1-2 Views Result Date: 01/31/2023 CLINICAL DATA:  Fall with lower back pain EXAM: PELVIS - 1 VIEW COMPARISON:  07/23/2017 FINDINGS: No evidence of  hip fracture or dislocation. No evidence of pelvic ring fracture or diastasis. Lower lumbar degenerative endplate spurring. IMPRESSION: No acute finding. Electronically Signed   By: Tiburcio Pea M.D.   On: 01/31/2023 09:50   CT Head Wo Contrast Result Date: 01/31/2023 CLINICAL DATA:  Right history: Head trauma, minor. Neck trauma. Additional history provided: Fall (with head trauma). On blood thinners. History of stroke. Low back pain. EXAM: CT HEAD WITHOUT CONTRAST CT CERVICAL SPINE WITHOUT CONTRAST TECHNIQUE: Multidetector CT imaging of the head and cervical spine was performed following the standard protocol without intravenous contrast. Multiplanar CT image reconstructions of the cervical spine were also generated. RADIATION DOSE REDUCTION: This exam was  performed according to the departmental dose-optimization program which includes automated exposure control, adjustment of the mA and/or kV according to patient size and/or use of iterative reconstruction technique. COMPARISON:  Report from head CT 04/27/2022.  Head 01/20/2015. FINDINGS: CT HEAD FINDINGS Mildly motion degraded exam. Within this limitation, findings are as follows. Brain: Cerebral atrophy. Mild-to-moderate dilation of the lateral and third ventricles, similar to the prior head CT of 04/27/2022. Acute callosal angle with some crowding of the sulci at the vertex. Patchy and ill-defined hypoattenuation within the cerebral white matter, nonspecific but compatible with moderate chronic small vessel ischemic disease. There is no acute intracranial hemorrhage. No demarcated cortical infarct. No extra-axial fluid collection. No evidence of an intracranial mass. No midline shift. Vascular: No hyperdense vessel.  Atherosclerotic calcifications. Skull: No calvarial fracture or aggressive osseous lesion. Sinuses/Orbits: No mass or acute finding within the imaged orbits. Other: Small-volume frothy secretions within the right frontal sinus. Mild mucosal thickening within the bilateral ethmoid, left sphenoid and bilateral maxillary sinuses. CT CERVICAL SPINE FINDINGS Alignment: Nonspecific straightening of the expected cervical lordosis. 2 mm C4-C5 grade 1 anterolisthesis. Skull base and vertebrae: The basion-dental and atlanto-dental intervals are maintained.No evidence of acute fracture to the cervical spine. Vertebral body and facet ankylosis at C3-C4. Facet ankylosis on the left at C6-C7. Soft tissues and spinal canal: No prevertebral fluid or swelling. No visible canal hematoma. Disc levels: Cervical spondylosis with multilevel disc space narrowing, disc bulges/central disc protrusions, posterior disc osteophyte complexes, endplate spurring, uncovertebral hypertrophy and facet arthrosis. At the non-fused  levels, disc space narrowing is greatest at C5-C6 and C6-C7 (advanced at these levels). At C5-C6, a posterior disc osteophyte complex contributes to up to moderate spinal canal stenosis. Multilevel bony neural foraminal narrowing. Multilevel ventral osteophytes, most prominent at C4-C5 and C5-C6. Upper chest: No consolidation within the imaged lung apices. No visible pneumothorax. IMPRESSION: CT head: 1. Mildly motion degraded exam. 2. No acute post-traumatic intracranial findings. 3. Moderate chronic small vessel changes within the cerebral white matter. 4. Mild-to-moderate dilation of the lateral and third ventricles. This could reflect central predominant atrophy or, in the appropriate clinical setting, normal pressure hydrocephalus (NPH). 5. Paranasal sinus disease as described. CT cervical spine: 1. No evidence of an acute cervical spine fracture. 2. Nonspecific straightening of the expected cervical lordosis. 3. 2 mm C4-C5 grade 1 anterolisthesis. 4. Cervical spondylosis as described. 5. Multilevel vertebral ankylosis. Electronically Signed   By: Jackey Loge D.O.   On: 01/31/2023 09:40   CT Cervical Spine Wo Contrast Result Date: 01/31/2023 CLINICAL DATA:  Right history: Head trauma, minor. Neck trauma. Additional history provided: Fall (with head trauma). On blood thinners. History of stroke. Low back pain. EXAM: CT HEAD WITHOUT CONTRAST CT CERVICAL SPINE WITHOUT CONTRAST TECHNIQUE: Multidetector CT imaging of the head and cervical spine  was performed following the standard protocol without intravenous contrast. Multiplanar CT image reconstructions of the cervical spine were also generated. RADIATION DOSE REDUCTION: This exam was performed according to the departmental dose-optimization program which includes automated exposure control, adjustment of the mA and/or kV according to patient size and/or use of iterative reconstruction technique. COMPARISON:  Report from head CT 04/27/2022.  Head 01/20/2015.  FINDINGS: CT HEAD FINDINGS Mildly motion degraded exam. Within this limitation, findings are as follows. Brain: Cerebral atrophy. Mild-to-moderate dilation of the lateral and third ventricles, similar to the prior head CT of 04/27/2022. Acute callosal angle with some crowding of the sulci at the vertex. Patchy and ill-defined hypoattenuation within the cerebral white matter, nonspecific but compatible with moderate chronic small vessel ischemic disease. There is no acute intracranial hemorrhage. No demarcated cortical infarct. No extra-axial fluid collection. No evidence of an intracranial mass. No midline shift. Vascular: No hyperdense vessel.  Atherosclerotic calcifications. Skull: No calvarial fracture or aggressive osseous lesion. Sinuses/Orbits: No mass or acute finding within the imaged orbits. Other: Small-volume frothy secretions within the right frontal sinus. Mild mucosal thickening within the bilateral ethmoid, left sphenoid and bilateral maxillary sinuses. CT CERVICAL SPINE FINDINGS Alignment: Nonspecific straightening of the expected cervical lordosis. 2 mm C4-C5 grade 1 anterolisthesis. Skull base and vertebrae: The basion-dental and atlanto-dental intervals are maintained.No evidence of acute fracture to the cervical spine. Vertebral body and facet ankylosis at C3-C4. Facet ankylosis on the left at C6-C7. Soft tissues and spinal canal: No prevertebral fluid or swelling. No visible canal hematoma. Disc levels: Cervical spondylosis with multilevel disc space narrowing, disc bulges/central disc protrusions, posterior disc osteophyte complexes, endplate spurring, uncovertebral hypertrophy and facet arthrosis. At the non-fused levels, disc space narrowing is greatest at C5-C6 and C6-C7 (advanced at these levels). At C5-C6, a posterior disc osteophyte complex contributes to up to moderate spinal canal stenosis. Multilevel bony neural foraminal narrowing. Multilevel ventral osteophytes, most prominent at  C4-C5 and C5-C6. Upper chest: No consolidation within the imaged lung apices. No visible pneumothorax. IMPRESSION: CT head: 1. Mildly motion degraded exam. 2. No acute post-traumatic intracranial findings. 3. Moderate chronic small vessel changes within the cerebral white matter. 4. Mild-to-moderate dilation of the lateral and third ventricles. This could reflect central predominant atrophy or, in the appropriate clinical setting, normal pressure hydrocephalus (NPH). 5. Paranasal sinus disease as described. CT cervical spine: 1. No evidence of an acute cervical spine fracture. 2. Nonspecific straightening of the expected cervical lordosis. 3. 2 mm C4-C5 grade 1 anterolisthesis. 4. Cervical spondylosis as described. 5. Multilevel vertebral ankylosis. Electronically Signed   By: Jackey Loge D.O.   On: 01/31/2023 09:40   CT Lumbar Spine Wo Contrast Result Date: 01/31/2023 CLINICAL DATA:  Back trauma, no prior imaging (Age >= 16y) EXAM: CT LUMBAR SPINE WITHOUT CONTRAST TECHNIQUE: Multidetector CT imaging of the lumbar spine was performed without intravenous contrast administration. Multiplanar CT image reconstructions were also generated. RADIATION DOSE REDUCTION: This exam was performed according to the departmental dose-optimization program which includes automated exposure control, adjustment of the mA and/or kV according to patient size and/or use of iterative reconstruction technique. COMPARISON:  None Available. FINDINGS: Segmentation: Lumbarization of S1 with last well-formed disc space labeled S1-S2 Alignment: Normal. Vertebrae: There is an acute nondisplaced fracture through the anterior superior endplate of L5 on the left (series 6, image 41). There is likely also an additional nondisplaced fracture through the right lateral aspect of the L1 vertebral body (series 6, image 38) There are acute  fractures through the bilateral sacral ala. There is also an age indeterminate fracture of the posterior elements  of S3 (series 8, image 49). Paraspinal and other soft tissues: Aortic atherosclerotic calcifications. Right common iliac artery stent in place. Disc levels: There is severe spinal canal stenosis at L4-L5 and L5-S1 IMPRESSION: 1. Acute nondisplaced fracture through the anterior superior endplate of L5 on the left. 2. Likely additional nondisplaced fracture through the right lateral aspect of the L1 vertebral body. 3. Acute fractures through the bilateral sacral ala. 4. Age indeterminate fracture of the posterior elements of S3. 5. Severe spinal canal stenosis at L4-L5 and L5-S1. Consider further evaluation with a lumbar spine MRI. Aortic Atherosclerosis (ICD10-I70.0). Electronically Signed   By: Lorenza Cambridge M.D.   On: 01/31/2023 09:40   DG Chest Portable 1 View Result Date: 01/31/2023 CLINICAL DATA:  Fall EXAM: PORTABLE CHEST 1 VIEW COMPARISON:  04/27/2022 FINDINGS: Stable cardiomediastinal contours. Aortic atherosclerosis. No focal airspace consolidation, pleural effusion, or pneumothorax. IMPRESSION: No active disease. Electronically Signed   By: Duanne Guess D.O.   On: 01/31/2023 08:49    Review of Systems  HENT:  Negative for ear discharge, ear pain, hearing loss and tinnitus.   Eyes:  Negative for photophobia and pain.  Respiratory:  Negative for cough and shortness of breath.   Cardiovascular:  Negative for chest pain.  Gastrointestinal:  Negative for abdominal pain, nausea and vomiting.  Genitourinary:  Negative for dysuria, flank pain, frequency and urgency.  Musculoskeletal:  Positive for arthralgias (Elbow pain) and back pain. Negative for myalgias and neck pain.  Neurological:  Positive for headaches. Negative for dizziness.  Hematological:  Does not bruise/bleed easily.  Psychiatric/Behavioral:  The patient is not nervous/anxious.    Blood pressure 128/65, pulse 81, temperature 97.9 F (36.6 C), temperature source Oral, resp. rate 20, height 5\' 9"  (1.753 m), weight 81.3 kg, SpO2  91%. Physical Exam Constitutional:      General: He is not in acute distress.    Appearance: He is well-developed. He is not diaphoretic.  HENT:     Head: Normocephalic and atraumatic.  Eyes:     General: No scleral icterus.       Right eye: No discharge.        Left eye: No discharge.     Conjunctiva/sclera: Conjunctivae normal.  Cardiovascular:     Rate and Rhythm: Normal rate and regular rhythm.  Pulmonary:     Effort: Pulmonary effort is normal. No respiratory distress.  Musculoskeletal:     Cervical back: Normal range of motion.     Comments: Pelvis--no traumatic wounds or rash, no ecchymosis, stable to manual stress, nontender, no pain with lat/AP comp  BLE No traumatic wounds, ecchymosis, or rash  Nontender  No knee or ankle effusion  Knee stable to varus/ valgus and anterior/posterior stress  Sens DPN, SPN, TN intact  Motor EHL, ext, flex, evers 5/5  DP 2+, PT 2+, No significant edema  Skin:    General: Skin is warm and dry.  Neurological:     Mental Status: He is alert.  Psychiatric:        Mood and Affect: Mood normal.        Behavior: Behavior normal.     Assessment/Plan: Pelvic fxs -- Will attempt mobilization with WBAT BLE. If not tolerated will plan on SI screw fixation on Friday but given exam doubt that will be necessary. Multiple medical problems including benign neoplasm of the colon, BPH, chronic lower back pain,  daily headaches, diverticulosis, hemorrhoids, memory loss, restless leg syndrome, sleep apnea no longer on CPAP after weight loss, hypertension, stage 3b CKD, grade 1 diastolic dysfunction, PAD, atrial fibrillation/flutter on apixaban -- per primary service. No need to hold anticoagulant for possible surgery.     Freeman Caldron, PA-C Orthopedic Surgery [phone] 02/01/2023, 9:01 AM

## 2023-02-01 NOTE — Progress Notes (Signed)
Inpatient Rehabilitation Admissions Coordinator   Per therapy recommendations patient was screened for CIR candidacy by Ottie Glazier RN MSN. Current payor trends with [employer] are unlikley to approve a CIR/AIR level rehab for this diagnosis.  I will not place a Rehab Conuslt at this time. Recommend other rehab venues to be pursued.   Ottie Glazier, RN, MSN Rehab Admissions Coordinator [phone] 02/01/2023 3:48 PM

## 2023-02-01 NOTE — Progress Notes (Signed)
Transition of Care [facility]) - CAGE-AID Screening   Patient Details  Name: Carl Harper MRN: [id number] Date of Birth: 1934-09-17  Transition of [facility]) CM/SW Contact:    Leota Sauers, RN Phone Number: 02/01/2023, 8:21 PM   Clinical Narrative:  Patient endorses current alcohol use, denies the use of illicit substances. Resources not given at this time.   CAGE-AID Screening:    Have You Ever Felt You Ought to Cut Down on Your Drinking or Drug Use?: No Have People Annoyed You By Critizing Your Drinking Or Drug Use?: No Have You Felt Bad Or Guilty About Your Drinking Or Drug Use?: No Have You Ever Had a Drink or Used Drugs First Thing In The Morning to Steady Your Nerves or to Get Rid of a Hangover?: No CAGE-AID Score: 0  Substance Abuse Education Offered: No

## 2023-02-02 DIAGNOSIS — S3210XA Unspecified fracture of sacrum, initial encounter for closed fracture: Secondary | ICD-10-CM | POA: Diagnosis not present

## 2023-02-02 LAB — BASIC METABOLIC PANEL
Anion gap: 12 (ref 5–15)
BUN: 21 mg/dL (ref 8–23)
CO2: 24 mmol/L (ref 22–32)
Calcium: 9 mg/dL (ref 8.9–10.3)
Chloride: 106 mmol/L (ref 98–111)
Creatinine, Ser: 1.46 mg/dL — ABNORMAL HIGH (ref 0.61–1.24)
GFR, Estimated: 47 mL/min — ABNORMAL LOW (ref >60)
Glucose, Bld: 117 mg/dL — ABNORMAL HIGH (ref 70–99)
Potassium: 4 mmol/L (ref 3.5–5.1)
Sodium: 142 mmol/L (ref 135–145)

## 2023-02-02 LAB — CBC WITH DIFFERENTIAL/PLATELET
Abs Immature Granulocytes: 0.07 10*3/uL (ref 0.00–0.07)
Basophils Absolute: 0.1 10*3/uL (ref 0.0–0.1)
Basophils Relative: 1 %
Eosinophils Absolute: 0.1 10*3/uL (ref 0.0–0.5)
Eosinophils Relative: 1 %
HCT: 38.8 % — ABNORMAL LOW (ref 39.0–52.0)
Hemoglobin: 12.5 g/dL — ABNORMAL LOW (ref 13.0–17.0)
Immature Granulocytes: 1 %
Lymphocytes Relative: 5 %
Lymphs Abs: 0.7 10*3/uL (ref 0.7–4.0)
MCH: 27.6 pg (ref 26.0–34.0)
MCHC: 32.2 g/dL (ref 30.0–36.0)
MCV: 85.7 fL (ref 80.0–100.0)
Monocytes Absolute: 1.1 10*3/uL — ABNORMAL HIGH (ref 0.1–1.0)
Monocytes Relative: 9 %
Neutro Abs: 11.3 10*3/uL — ABNORMAL HIGH (ref 1.7–7.7)
Neutrophils Relative %: 83 %
Platelets: 336 10*3/uL (ref 150–400)
RBC: 4.53 MIL/uL (ref 4.22–5.81)
RDW: 14.4 % (ref 11.5–15.5)
WBC: 13.4 10*3/uL — ABNORMAL HIGH (ref 4.0–10.5)
nRBC: 0 % (ref 0.0–0.2)

## 2023-02-02 NOTE — Progress Notes (Signed)
Physical Therapy Treatment  Patient Details Name: Carl Harper MRN: [id number] DOB: 1934/08/08 Today's Date: 02/02/2023   History of Present Illness 88 y.o. male who came into the emergency department 01/31/23 after having an unwitnessed fall at home hitting the back of the head on a wall, but no LOC.  CT lumbar spine showing acute nondisplaced fracture through the anterior superior endplate of L5 on the left.  Likely additional nondisplaced fracture through the right lateral aspect of the L1 vertebral body.  Acute fractures through the bilateral sacral ala.  PMH significant of benign neoplasm of the colon, BPH, chronic lower back pain, daily headaches, diverticulosis, hemorrhoids, memory loss, restless leg syndrome, sleep apnea no longer on CPAP after weight loss, hypertension, stage 3b CKD, grade 1 diastolic dysfunction, PAD, atrial fibrillation/flutter on apixaban    PT Comments  Pt progressing well towards physical therapy goals. Cognition appears clearer this session and wife was present throughout for confirmation of home set up and PLOF. Overall pt functioning at a min assist to min guard assist level with the RW for support.  Recommend RW use instead of rollator at this time for increased stability/support. Pt was able to demonstrate stair negotiation with mod assist for descent. Pt issued a gait belt and was educated on car transfer as well. Will continue to follow and progress as able per POC.    If plan is discharge home, recommend the following: A little help with walking and/or transfers;Assistance with cooking/housework;Direct supervision/assist for medications management;Direct supervision/assist for financial management;Assist for transportation;Help with stairs or ramp for entrance;Supervision due to cognitive status   Can travel by private vehicle        Equipment Recommendations  Rolling walker (2 wheels)    Recommendations for Other Services       Precautions /  Restrictions Precautions Precautions: Fall Precaution Comments: multiple recent falls; pt cannot recount how Restrictions Weight Bearing Restrictions Per Provider Order: No     Mobility  Bed Mobility Overal bed mobility: Needs Assistance Bed Mobility: Supine to Sit     Supine to sit: Min assist, Used rails     General bed mobility comments: Min assist for trunk elevation to full sitting position. HOB elevated and pt was able to scoot out fully to EOB with increased time.    Transfers Overall transfer level: Needs assistance Equipment used: Rolling walker (2 wheels) Transfers: Sit to/from Stand, Bed to chair/wheelchair/BSC Sit to Stand: Min assist           General transfer comment: VC's for hand placement on seated surface for safety. No Min assist for power up to full stand. Once standing, pt unable to achieve neutral spine due to kyphosis and extreme forward head posture.    Ambulation/Gait Ambulation/Gait assistance: Contact guard assist Gait Distance (Feet): 125 Feet Assistive device: Rolling walker (2 wheels) Gait Pattern/deviations: Step-through pattern, Decreased stride length, Trunk flexed Gait velocity: Decreased Gait velocity interpretation: <1.31 ft/sec, indicative of household ambulator   General Gait Details: VC's for improved posture. Pt able to imporve slightly but grimacing and moaning in pain. Pt ambulated from his room to hallway stairwell with a chair follow. No overt LOB noted.   Stairs Stairs: Yes Stairs assistance: Mod assist, Min assist Stair Management: One rail Left, Step to pattern, Forwards (HHA on R) Number of Stairs: 3 General stair comments: Pt typically has the SPC in the R hand during stair negotiation. Pt provided with HHA on the R, however not pushing through hand on ascend.  Heavy push through hand on descend - mod approaching max assist.   Wheelchair Mobility     Tilt Bed    Modified Rankin (Stroke Patients Only)        Balance Overall balance assessment: History of Falls (average of 1 per month)                                          Cognition Arousal: Alert Behavior During Therapy: Anxious Overall Cognitive Status: Impaired/Different from baseline Area of Impairment: Safety/judgement, Awareness, Problem solving                         Safety/Judgement: Decreased awareness of safety, Decreased awareness of deficits Awareness: Emergent Problem Solving: Slow processing, Difficulty sequencing, Decreased initiation, Requires verbal cues          Exercises      General Comments        Pertinent Vitals/Pain Pain Assessment Pain Assessment: Faces Faces Pain Scale: Hurts little more Pain Location: back/pelvis Pain Descriptors / Indicators: Discomfort, Guarding, Aching, Spasm Pain Intervention(s): Limited activity within patient's tolerance, Monitored during session, Repositioned    Home Living Family/patient expects to be discharged to:: Private residence Living Arrangements: Spouse/significant other Available Help at Discharge: Family;Available 24 hours/day Type of Home: House Home Access: Stairs to enter Entrance Stairs-Rails: Left Entrance Stairs-Number of Steps: 2 steps from garage Alternate Level Stairs-Number of Steps: 1 flight Home Layout: Two level;Able to live on main level with bedroom/bathroom Home Equipment: Shower seat - built in;Shower seat;Grab bars - tub/shower;BSC/3in1;Grab bars - toilet;Wheelchair Financial trader (4 wheels) Additional Comments: Pt notes 12 falls in the last year due to getting dizzy, reports it happens when standing still    Prior Function            PT Goals (current goals can now be found in the care plan section) Acute Rehab PT Goals Patient Stated Goal: to go home PT Goal Formulation: With patient/family Time For Goal Achievement: 02/15/23 Potential to Achieve Goals: Good Progress towards PT goals: Progressing  toward goals    Frequency    Min 1X/week      PT Plan      Co-evaluation PT/OT/SLP Co-Evaluation/Treatment: Yes Reason for Co-Treatment: Necessary to address cognition/behavior during functional activity;For patient/therapist safety;To address functional/ADL transfers;Other (comment) (Discharge planning, increased pain initially)          AM-PAC PT "6 Clicks" Mobility   Outcome Measure  Help needed turning from your back to your side while in a flat bed without using bedrails?: A Little Help needed moving from lying on your back to sitting on the side of a flat bed without using bedrails?: A Little Help needed moving to and from a bed to a chair (including a wheelchair)?: A Little Help needed standing up from a chair using your arms (e.g., wheelchair or bedside chair)?: A Little Help needed to walk in hospital room?: A Little Help needed climbing 3-5 steps with a railing? : A Little 6 Click Score: 18    End of Session Equipment Utilized During Treatment: Gait belt Activity Tolerance: Patient tolerated treatment well Patient left: in chair;with call bell/phone within reach;with chair alarm set;with nursing/sitter in room Nurse Communication: Mobility status PT Visit Diagnosis: Other abnormalities of gait and mobility (R26.89);Repeated falls (R29.6);Muscle weakness (generalized) (M62.81)     Time: 9562-1308 PT Time Calculation (min) (  ACUTE ONLY): 34 min  Charges:    $Gait Training: 8-22 mins PT General Charges $$ ACUTE PT VISIT: 1 Visit                     Conni Slipper, PT, DPT Acute Rehabilitation Services Secure Chat Preferred Office: [phone]    Marylynn Pearson 02/02/2023, 3:41 PM

## 2023-02-02 NOTE — Progress Notes (Signed)
PROGRESS NOTE  Carl Harper  DOB: February 03, 1935  PCP: Tracey Harries, MD UJW:[id number]  DOA: 01/31/2023  LOS: 1 day  Hospital Day: 3  Brief narrative: Carl Harper is a 88 y.o. male with PMH significant for HTN, HLD, A-fib/flutter on Eliquis PAD, CKD, sleep apnea, BPH, diverticulosis, restless legs, dementia. At baseline, patient lives at home with his wife.  He ambulates with a rolling walker and suffers frequent falls. 12/11, patient was brought to the ED from home after a fall.  Patient had an unwitnessed fall where he hit the back of his head, did not pass out.  Also had significant trauma to the lower back.  No prodromal symptoms of chest pain, palpitation, dizziness.  In the ED, hemodynamically stable CT head did not show any acute abnormality, showed moderate chronic small vessel ischemic changes, mild to moderate dilation of the lateral and third ventricles.   CT cervical spine did not show any evidence of an acute cervical spine fracture.  There is a 2 mm C4-C5 grade 1 anterolisthesis.  Cervical spondylosis.  Multiple vertebral ankylosis.   CT lumbar spine showed acute nondisplaced fracture through the anterior superior endplate of L5 on the left.  Likely additional nondisplaced fracture through the right lateral aspect of the L1 vertebral body.  Acute fractures through the bilateral sacral ala.  Age-indeterminate fracture of the posterior elements of S3.  Severe spinal canal stenosis at L4-L5 and L5-S1.    EDP discussed the case with orthopedics Dr. Odis Hollingshead.  Recommended PT eval, pain control.  If fails, or if sacrum could be an option. Admitted to [facility]  Subjective: Patient was seen and examined this morning.   Lying on bed.  Not in distress.  Limited mobility since fall.  PT recommended SNF.  Patient however does not want to go to SNF.  Wife at bedside states she is the only caregiver at home and is not comfortable.  Patient and his wife are hoping for 1-2 more days of  therapy in the hospital before he is ready to go home.  Assessment and plan: Closed sacral fracture Secondary to a fall CT lumbar spine findings as above PT eval ordered for mobilization with weightbearing as tolerated bilateral lower extremities. Currently on pain control with scheduled Tylenol, as needed oxycodone and as needed IV Dilaudid.  If no improvement in pain, orthopedics plans for screw fixation.  Orthopedics to follow-up. Continue to monitor.  Closed L5 vertebral fracture EDP discussed with neurosurgery Dr. Franky Macho. No surgical intervention at this time.  PAD Aortic atherosclerosis Chronically anticoagulated with Eliquis.   HTN (hypertension) Chronic diastolic dysfunction Continue losartan 25 mg p.o. daily.  Lasix on hold. BP soft after analgesics.    Paroxysmal atrial fibrillation CHA?DS?-VASc Score of at least 5.  Eliquis can be resumed per orthopedics.  CAD No angina. Nitroglycerin as needed. Resume apixaban in the future. Not on statin. Follow-up with PCP and cardiology as an outpatient.   BPH Continue Flomax and Proscar.  OSA on CPAP No longer on CPAP   Restless legs syndrome Continue pramipexole 1.5 mg p.o. bedtime.   CKD 3B  Monitor renal function and electrolytes. Recent Labs    07/20/22 1352 01/31/23 1219 02/01/23 1008 02/02/23 0547  BUN 39* 31* 21 21  CREATININE 1.52* 1.31* 1.42* 1.46*  CO2 23 22 21* 24   Normocytic anemia Monitor hematocrit and hemoglobin. Recent Labs    01/31/23 1219 02/01/23 1008 02/02/23 0547  HGB 11.7* 12.4* 12.5*  MCV 88.7 85.6 85.7  Mobility: Encourage ambulation  Goals of care   Code Status: Full Code     DVT prophylaxis: Eliquis resumed apixaban (ELIQUIS) tablet 2.5 mg Start: 02/01/23 1400 apixaban (ELIQUIS) tablet 2.5 mg   Antimicrobials: None Fluid: None Consultants: Orthopedics Family Communication: Wife at bedside  Status: Inpatient Level of care:  Telemetry Medical   Patient is  from: Home Needs to continue in-hospital care: For pain management.  Possible need of surgical intervention Anticipated d/c to: Pending clinical course   Diet:  Diet Order             Diet Heart Room service appropriate? Yes; Fluid consistency: Thin  Diet effective now                   Scheduled Meds:  acetaminophen  1,000 mg Oral TID   apixaban  2.5 mg Oral BID   ciprofloxacin  250 mg Oral BID   cycloSPORINE  1 drop Both Eyes BID   finasteride  5 mg Oral Daily   losartan  25 mg Oral Daily   pramipexole  1.5 mg Oral QHS   senna-docusate  1 tablet Oral BID   tamsulosin  0.4 mg Oral Daily    PRN meds: HYDROmorphone (DILAUDID) injection, magnesium hydroxide, oxyCODONE   Infusions:    Antimicrobials: Anti-infectives (From admission, onward)    Start     Dose/Rate Route Frequency Ordered Stop   01/31/23 2000  ciprofloxacin (CIPRO) tablet 250 mg        250 mg Oral 2 times daily 01/31/23 1615         Objective: Vitals:   02/02/23 0342 02/02/23 0814  BP: (!) 112/99 (!) 141/65  Pulse: 66 99  Resp: 16 18  Temp: 98.2 F (36.8 C) 98.7 F (37.1 C)  SpO2: 92% 92%    Intake/Output Summary (Last 24 hours) at 02/02/2023 1341 Last data filed at 02/02/2023 0900 Gross per 24 hour  Intake 120 ml  Output 700 ml  Net -580 ml   Filed Weights   01/31/23 1700  Weight: 81.3 kg   Weight change:  Body mass index is 26.48 kg/m.   Physical Exam: General exam: Pleasant, elderly Caucasian male Skin: No rashes, lesions or ulcers. HEENT: Atraumatic, normocephalic, no obvious bleeding Lungs: Clear to auscultation bilaterally CVS: Regular rate and rhythm, no murmur GI/Abd soft, nontender, nondistended, bowel sound present Back: Midline tenderness present in sacral area CNS: Alert, awake, oriented x 3 Psychiatry: Frustrated that he could not be discharged today. Extremities: No pedal edema, no calf tenderness  Data Review: I have personally reviewed the laboratory data  and studies available.  F/u labs ordered Unresulted Labs (From admission, onward)    None      Total time spent in review of labs and imaging, patient evaluation, formulation of plan, documentation and communication with family: 45 minutes  Signed, Lorin Glass, MD [facility] 02/02/2023

## 2023-02-02 NOTE — TOC Initial Note (Signed)
Transition of Care [facility]) - Initial/Assessment Note    Patient Details  Name: Carl Harper MRN: [id number] Date of Birth: 08-23-1934  Transition of [facility] CM/SW Contact:    Carley Hammed, LCSW Phone Number: 02/02/2023, 12:33 PM  Clinical Narrative:                 CSW noted pt is disoriented and spoke with spouse, Aram Beecham. Aram Beecham confirmed pt lives at home with her and has all necessary DME. Pt typically uses a walker to ambulate. Aram Beecham notes they plan on taking pt home and have no concerns about being able to care for him. They are agreeable to [facility] and have no preference for company. Aram Beecham notes they may need transportation home for pt. CSW advised Aram Beecham to bring pt's WC and TOC can set up SAFE transport at discharge. CSW consulted CM and noted pt's insurance is in network with [employer], they are agreeable to PT/OT and RN at DC, requested orders from MD. [facility] will continue to follow for DC needs.   Expected Discharge Plan: Home w Home Health Services Barriers to Discharge: Continued Medical Work up   Patient Goals and CMS Choice Patient states their goals for this hospitalization and ongoing recovery are:: Pt disoriented and unable to participate in goal setting.   Choice offered to / list presented to : Spouse      Expected Discharge Plan and Services   Discharge Planning Services: CM Consult Post Acute Care Choice: Home Health Living arrangements for the past 2 months: Single Family Home                           HH Arranged: PT, OT, RN HH Agency: Other - See comment, [facility] Date [facility] Agency Contacted: 02/02/23 Time HH Agency Contacted: 1231 Representative spoke with at [facility] Agency: Angie  Prior Living Arrangements/Services Living arrangements for the past 2 months: Single Family Home Lives with:: Spouse Patient language and need for interpreter reviewed:: Yes Do you feel safe going back to the place where you live?: Yes      Need  for Family Participation in Patient Care: Yes (Comment) Care giver support system in place?: Yes (comment) Current home services: DME Criminal Activity/Legal Involvement Pertinent to Current Situation/Hospitalization: No - Comment as needed  Activities of Daily Living   ADL Screening (condition at time of admission) Independently performs ADLs?: No Does the patient have a NEW difficulty with bathing/dressing/toileting/self-feeding that is expected to last >3 days?: Yes (Initiates electronic notice to provider for possible OT consult) Does the patient have a NEW difficulty with getting in/out of bed, walking, or climbing stairs that is expected to last >3 days?: Yes (Initiates electronic notice to provider for possible PT consult) Does the patient have a NEW difficulty with communication that is expected to last >3 days?: No Is the patient deaf or have difficulty hearing?: No Does the patient have difficulty seeing, even when wearing glasses/contacts?: No Does the patient have difficulty concentrating, remembering, or making decisions?: Yes  Permission Sought/Granted Permission sought to share information with : Family Supports Permission granted to share information with : Yes, Verbal Permission Granted  Share Information with NAME: Aram Beecham     Permission granted to share info w Relationship: Spouse     Emotional Assessment Appearance:: Appears stated age Attitude/Demeanor/Rapport: Unable to Assess Affect (typically observed): Unable to Assess Orientation: : Oriented to Self, Oriented to Place Alcohol / Substance Use:  Not Applicable Psych Involvement: No (comment)  Admission diagnosis:  Sacral fracture, closed (HCC) [S32.10XA] Closed fracture of first lumbar vertebra, unspecified fracture morphology, initial encounter (HCC) [S32.019A] Closed fracture of sacrum, unspecified portion of sacrum, initial encounter (HCC) [S32.10XA] Patient Active Problem List   Diagnosis Date Noted    Sacral fracture, closed (HCC) 01/31/2023   Grade I diastolic dysfunction 01/31/2023   Basal cell carcinoma (BCC) of right lower extremity 01/31/2023   Normocytic anemia 01/31/2023   Closed L5 vertebral fracture (HCC) 01/31/2023   Aortic atherosclerosis (HCC) 01/31/2023   History of 2019 novel coronavirus disease (COVID-19) 11/05/2019   Thoracic ascending aortic aneurysm (HCC) 07/18/2018   Stable angina pectoris (HCC) 06/03/2018   Chronic kidney disease (CKD), stage III (moderate) (HCC) 03/28/2018   Coronary artery disease involving native coronary artery of native heart without angina pectoris 02/05/2017   Chronic left-sided low back pain without sciatica 11/03/2016   Palpitations 08/10/2016   Paroxysmal atrial fibrillation (HCC) 08/10/2016   Weakness of both lower extremities 11/09/2015   Intention tremor 05/07/2015   Atrial flutter (HCC) 10/28/2014   History of biliary T-tube placement 12/11/2013   DOE (dyspnea on exertion) 10/10/2013   Pseudophakia of both eyes 10/07/2013   PVC's (premature ventricular contractions) 09/26/2013   Bradycardia 09/24/2013   OSA on CPAP 03/31/2013   Restless legs syndrome 12/17/2012   Hx of adenomatous colonic polyps 02/28/2012   PAD (peripheral artery disease) (HCC) 11/08/2011   HTN (hypertension) 11/08/2011   Claudication (HCC) 11/08/2011   Benign prostatic hyperplasia with post-void dribbling 09/19/2010   PCP:  Tracey Harries, MD Pharmacy:   [facility] [address] [place], [place] - [address] AT [facility] [address] [place]. [place] [address] Phone: [phone] Fax: [phone]     Social Drivers of Health (SDOH) Social History: SDOH Screenings   Food Insecurity: No Food Insecurity (01/31/2023)  Housing: Low Risk  (02/01/2023)  Transportation Needs: No Transportation Needs (01/31/2023)  Utilities: Not At Risk (01/31/2023)  Financial Resource Strain: Low Risk  (05/08/2022)   Received  from [facility]  Physical Activity: Unknown (12/26/2021)   Received from [facility], [facility]  Recent Concern: Physical Activity - Inactive (12/26/2021)   Received from [facility]  Social Connections: Socially Integrated (12/26/2021)   Received from [facility], [facility]  Stress: No Stress Concern Present (12/26/2021)   Received from [facility]  Tobacco Use: Medium Risk (01/31/2023)   SDOH Interventions: Food Insecurity Interventions: Intervention Not Indicated Housing Interventions: Intervention Not Indicated Transportation Interventions: Intervention Not Indicated Utilities Interventions: Intervention Not Indicated   Readmission Risk Interventions     No data to display

## 2023-02-02 NOTE — Plan of Care (Signed)
  Problem: Nutrition: Goal: Adequate nutrition will be maintained Outcome: Progressing   Problem: Coping: Goal: Level of anxiety will decrease Outcome: Progressing   Problem: Pain Management: Goal: General experience of comfort will improve Outcome: Progressing   Problem: Safety: Goal: Ability to remain free from injury will improve Outcome: Progressing

## 2023-02-02 NOTE — Evaluation (Signed)
Occupational Therapy Evaluation Patient Details Name: Carl Harper MRN: [id number] DOB: [birth year] Today's Date: 02/02/2023   History of Present Illness 88 y.o. male who came into the emergency department 01/31/23 after having an unwitnessed fall at home hitting the back of the head on a wall, but no LOC.  CT lumbar spine showing acute nondisplaced fracture through the anterior superior endplate of L5 on the left.  Likely additional nondisplaced fracture through the right lateral aspect of the L1 vertebral body.  Acute fractures through the bilateral sacral ala.  PMH significant of benign neoplasm of the colon, BPH, chronic lower back pain, daily headaches, diverticulosis, hemorrhoids, memory loss, restless leg syndrome, sleep apnea no longer on CPAP after weight loss, hypertension, stage 3b CKD, grade 1 diastolic dysfunction, PAD, atrial fibrillation/flutter on apixaban   Clinical Impression   Pt admitted for above, he notes a hx of falls due to getting dizzy with static standing. BP noted to increase while standing but not alarming enough to halt therapy, pt ambulated with CGA + RW and reports no increase in pain. Discussed with pt and wife use of home grab bars to assist with bed mobility, also educated spouse on the use of gait belt to assist pt with mobility at home (including stairs). Pt demonstrating increased need for assist with LB ADLs at this time. OT to continue to follow pt acutely to address deficits and educate on compensatory strategies prn. Patient would benefit from post acute Home OT services to help maximize functional independence in natural environment       If plan is discharge home, recommend the following: A little help with walking and/or transfers;Help with stairs or ramp for entrance;A little help with bathing/dressing/bathroom;Assistance with cooking/housework    Functional Status Assessment  Patient has had a recent decline in their functional status and  demonstrates the ability to make significant improvements in function in a reasonable and predictable amount of time.  Equipment Recommendations  None recommended by OT (Pt has rec DME, discussed bed rails)    Recommendations for Other Services       Precautions / Restrictions Precautions Precautions: Fall Precaution Comments: multiple recent falls Restrictions Weight Bearing Restrictions Per Provider Order: No      Mobility Bed Mobility Overal bed mobility: Needs Assistance Bed Mobility: Supine to Sit     Supine to sit: Min assist, Used rails     General bed mobility comments: Min assist for trunk elevation to full sitting position. HOB elevated and pt was able to scoot out fully to EOB with increased time.    Transfers Overall transfer level: Needs assistance Equipment used: Rolling walker (2 wheels) Transfers: Sit to/from Stand, Bed to chair/wheelchair/BSC Sit to Stand: Min assist           General transfer comment: VC's for hand placement on seated surface for safety. No Min assist for power up to full stand. Once standing, pt unable to achieve neutral spine due to kyphosis and extreme forward head posture.      Balance Overall balance assessment: History of Falls (average of 1 per month)                                         ADL either performed or assessed with clinical judgement   ADL Overall ADL's : Needs assistance/impaired Eating/Feeding: Independent;Sitting   Grooming: Standing;Contact guard assist   Upper Body Bathing:  Standing;Contact guard assist   Lower Body Bathing: Sitting/lateral leans;Minimal assistance   Upper Body Dressing : Sitting;Set up   Lower Body Dressing: Sitting/lateral leans;Total assistance   Toilet Transfer: Contact guard assist;Rolling walker (2 wheels);BSC/3in1;Ambulation   Toileting- Clothing Manipulation and Hygiene: Contact guard assist;Sit to/from stand Toileting - Clothing Manipulation Details  (indicate cue type and reason): Reviewed self rear pericare with pt, discussed wiping from front may be easier as pt had hard time reaching bottom. Did demonstrated ability to complete partial stand to wipe with CGA     Functional mobility during ADLs: Contact guard assist;Rolling walker (2 wheels)       Vision         Perception         Praxis         Pertinent Vitals/Pain Pain Assessment Pain Assessment: Faces Pain Score: 7  Faces Pain Scale: Hurts little more Pain Location: back/pelvis Pain Descriptors / Indicators: Discomfort, Guarding, Aching, Spasm Pain Intervention(s): Limited activity within patient's tolerance, Monitored during session, Premedicated before session     Extremity/Trunk Assessment Upper Extremity Assessment Upper Extremity Assessment: Generalized weakness           Communication Communication Communication: No apparent difficulties   Cognition Arousal: Alert Behavior During Therapy: Anxious Overall Cognitive Status: Impaired/Different from baseline Area of Impairment: Safety/judgement, Awareness, Problem solving                         Safety/Judgement: Decreased awareness of safety, Decreased awareness of deficits Awareness: Emergent Problem Solving: Slow processing, Difficulty sequencing, Decreased initiation, Requires verbal cues       General Comments  VSS; Bp seated EOB 145/71 (87) and standing 164/78(105)    Exercises     Shoulder Instructions      Home Living Family/patient expects to be discharged to:: Private residence Living Arrangements: Spouse/significant other Available Help at Discharge: Family;Available 24 hours/day Type of Home: House Home Access: Stairs to enter [employer] of Steps: 2 steps from garage Entrance Stairs-Rails: Left Home Layout: Two level;Able to live on main level with bedroom/bathroom Alternate Level Stairs-Number of Steps: 1 flight   Bathroom Shower/Tub: Company secretary:  (BSC over toilet) Bathroom Accessibility: Yes How Accessible: Accessible via walker Home Equipment: Shower seat - built in;Shower seat;Grab bars - tub/shower;BSC/3in1;Grab bars - toilet;Wheelchair Financial trader (4 wheels)   Additional Comments: Pt notes 12 falls in the last year due to getting dizzy, reports it happens when standing still      Prior Functioning/Environment Prior Level of Function : Independent/Modified Independent             Mobility Comments: Ind using Rollator ADLs Comments: Ind. Wife gives supervision assist for showering        OT Problem List: Decreased strength;Impaired balance (sitting and/or standing);Pain      OT Treatment/Interventions: Self-care/ADL training;Balance training;Therapeutic exercise;Therapeutic activities;Patient/family education;DME and/or AE instruction    OT Goals(Current goals can be found in the care plan section) Acute Rehab OT Goals Patient Stated Goal: To go home OT Goal Formulation: With patient/family Time For Goal Achievement: 02/16/23 Potential to Achieve Goals: Good ADL Goals Pt Will Perform Grooming: with supervision;standing Pt Will Perform Lower Body Bathing: sitting/lateral leans;with adaptive equipment;with set-up Pt Will Perform Lower Body Dressing: with set-up;sitting/lateral leans;with adaptive equipment (or with caregiver independent in assisting) Pt Will Transfer to Toilet: with supervision;ambulating  OT Frequency: Min 1X/week    Co-evaluation   Reason for Co-Treatment:  Necessary to address cognition/behavior during functional activity;For patient/therapist safety;To address functional/ADL transfers;Other (comment) (Discharge planning, increased pain initially)   OT goals addressed during session: ADL's and self-care      AM-PAC OT "6 Clicks" Daily Activity     Outcome Measure Help from another person eating meals?: None Help from another person taking care of personal grooming?:  A Little Help from another person toileting, which includes using toliet, bedpan, or urinal?: A Little Help from another person bathing (including washing, rinsing, drying)?: A Little Help from another person to put on and taking off regular upper body clothing?: A Little Help from another person to put on and taking off regular lower body clothing?: Total 6 Click Score: 17   End of Session Equipment Utilized During Treatment: Gait belt;Rolling walker (2 wheels) Nurse Communication: Mobility status  Activity Tolerance: Patient tolerated treatment well Patient left: in chair;with call bell/phone within reach;with chair alarm set;with family/visitor present  OT Visit Diagnosis: Unsteadiness on feet (R26.81);Other abnormalities of gait and mobility (R26.89);Pain;History of falling (Z91.81) Pain - Right/Left:  (sacrum)                Time: 1610-9604 OT Time Calculation (min): 42 min Charges:  OT General Charges $OT Visit: 1 Visit OT Evaluation $OT Eval Moderate Complexity: 1 Mod  02/02/2023  AB, OTR/L  Acute Rehabilitation Services  Office: [phone]   Tristan Schroeder 02/02/2023, 4:04 PM

## 2023-02-03 DIAGNOSIS — S3210XA Unspecified fracture of sacrum, initial encounter for closed fracture: Secondary | ICD-10-CM | POA: Diagnosis not present

## 2023-02-03 MED ORDER — OXYCODONE HCL 5 MG PO TABS: 5.0000 mg | ORAL_TABLET | Freq: Four times a day (QID) | ORAL | 0 refills | Status: AC | PRN

## 2023-02-03 MED ORDER — ACETAMINOPHEN 500 MG PO TABS: 1000.0000 mg | ORAL_TABLET | Freq: Three times a day (TID) | ORAL | 0 refills | Status: AC

## 2023-02-03 MED ORDER — FUROSEMIDE 40 MG PO TABS: 40.0000 mg | ORAL_TABLET | Freq: Every day | ORAL | Status: DC | PRN

## 2023-02-03 NOTE — Discharge Summary (Signed)
Physician Discharge Summary  Carl Harper GUY:[id number] DOB: 02/22/1934 DOA: 01/31/2023  PCP: Tracey Harries, MD  Admit date: 01/31/2023 Discharge date: 02/03/2023  Admitted From: Home Discharge disposition: Home with home health PT  Recommendations at discharge:  Continue pain management with scheduled Tylenol, as needed oxycodone and as needed Robaxin If you continue to have fall, you need to have a discussion with his PCP about stopping Eliquis.  Brief narrative: Carl Harper is a 88 y.o. male with PMH significant for HTN, HLD, A-fib/flutter on Eliquis PAD, CKD, sleep apnea, BPH, diverticulosis, restless legs, dementia. At baseline, patient lives at home with his wife.  He ambulates with a rolling walker and suffers frequent falls. 12/11, patient was brought to the ED from home after a fall.  Patient had an unwitnessed fall where he hit the back of his head, did not pass out.  Also had significant trauma to the lower back.  No prodromal symptoms of chest pain, palpitation, dizziness.  In the ED, hemodynamically stable CT head did not show any acute abnormality, showed moderate chronic small vessel ischemic changes, mild to moderate dilation of the lateral and third ventricles.   CT cervical spine did not show any evidence of an acute cervical spine fracture.  There is a 2 mm C4-C5 grade 1 anterolisthesis.  Cervical spondylosis.  Multiple vertebral ankylosis.   CT lumbar spine showed acute nondisplaced fracture through the anterior superior endplate of L5 on the left.  Likely additional nondisplaced fracture through the right lateral aspect of the L1 vertebral body.  Acute fractures through the bilateral sacral ala.  Age-indeterminate fracture of the posterior elements of S3.  Severe spinal canal stenosis at L4-L5 and L5-S1.    EDP discussed the case with orthopedics Dr. Odis Hollingshead.  Recommended PT eval, pain control.  If fails, or if sacrum could be an option. Admitted to  [facility]  Subjective: Patient was seen and examined this morning.  Pleasant elderly Caucasian male.  Lying on bed.  Not in distress. Pain controlled.  Feels ready to go home today.  Hospital course: Closed sacral fracture Secondary to a fall CT lumbar spine findings as above PT eval obtained for mobilization with weightbearing as tolerated bilateral lower extremities. Home health PT recommended. Continue pain control with scheduled Tylenol, as needed oxycodone and as needed Robaxin Follow-up with orthopedics as an outpatient.  If no improvement in pain, orthopedics plans for screw fixation.    Closed L5 vertebral fracture EDP discussed with neurosurgery Dr. Franky Macho. No surgical intervention at this time.  PAD Aortic atherosclerosis Chronically anticoagulated with Eliquis. If continues to have fall, he needs to have a discussion with his PCP about stopping Eliquis.   HTN (hypertension) Chronic diastolic dysfunction Continue losartan 25 mg p.o. daily.  Continue Lasix as needed    Paroxysmal atrial fibrillation CHA?DS?-VASc Score of at least 5.  Eliquis plan as above.  CAD No angina. Nitroglycerin as needed. Resume apixaban in the future. Not on statin. Follow-up with PCP and cardiology as an outpatient.   BPH Continue Flomax and Proscar.  OSA on CPAP No longer on CPAP   Restless legs syndrome Continue pramipexole 1.5 mg p.o. bedtime.   CKD 3B  Creatinine stable Recent Labs    07/20/22 1352 01/31/23 1219 02/01/23 1008 02/02/23 0547  BUN 39* 31* 21 21  CREATININE 1.52* 1.31* 1.42* 1.46*  CO2 23 22 21* 24   Normocytic anemia Hemoglobin stable Recent Labs    01/31/23 1219 02/01/23 1008 02/02/23 0547  HGB 11.7* 12.4* 12.5*  MCV 88.7 85.6 85.7   Goals of care   Code Status: Full Code   Diet:  Diet Order             Diet general           Diet Heart Room service appropriate? Yes; Fluid consistency: Thin  Diet effective now                    Nutritional status:  Body mass index is 26.48 kg/m.       Wounds:  -    Discharge Exam:   Vitals:   02/02/23 1554 02/02/23 1959 02/03/23 0338 02/03/23 0811  BP: 124/62 132/74 (!) 146/86 124/64  Pulse: (!) 101 (!) 102 97 (!) 101  Resp: 18  18 18   Temp: 98.4 F (36.9 C) 98.2 F (36.8 C) 98.1 F (36.7 C) 98 F (36.7 C)  TempSrc: Oral Oral Oral Oral  SpO2: (!) 88% (!) 88% 90% 90%  Weight:      Height:        Body mass index is 26.48 kg/m.  General exam: Pleasant, elderly Caucasian male Skin: No rashes, lesions or ulcers. HEENT: Atraumatic, normocephalic, no obvious bleeding Lungs: Clear to auscultation bilaterally CVS: Regular rate and rhythm, no murmur GI/Abd soft, nontender, nondistended, bowel sound present Back: Midline tenderness present in sacral area CNS: Alert, awake, oriented to place and person Psychiatry: Feels excited to go home today Extremities: No pedal edema, no calf tenderness  Follow ups:    Follow-up Information     Tracey Harries, MD Follow up.   Specialty: Family Medicine Contact information: [address] [facility] [place] [address] [phone]         Roby Lofts, MD Follow up.   Specialty: Orthopedic Surgery Contact information: [address] [place] [address] [phone]                 Discharge Instructions:   Discharge Instructions     Call MD for:  difficulty breathing, headache or visual disturbances   Complete by: As directed    Call MD for:  extreme fatigue   Complete by: As directed    Call MD for:  hives   Complete by: As directed    Call MD for:  persistant dizziness or light-headedness   Complete by: As directed    Call MD for:  persistant nausea and vomiting   Complete by: As directed    Call MD for:  severe uncontrolled pain   Complete by: As directed    Call MD for:  temperature >100.4   Complete by: As directed    Diet general   Complete by:  As directed    Increase activity slowly   Complete by: As directed        Discharge Medications:   Allergies as of 02/03/2023       Reactions   Bee Venom Anaphylaxis   Amiodarone Other (See Comments)   "Corneal deposits"        Medication List     TAKE these medications    acetaminophen 500 MG tablet Commonly known as: TYLENOL Take 2 tablets (1,000 mg total) by mouth 3 (three) times daily. What changed:  how much to take when to take this reasons to take this   alendronate 70 MG tablet Commonly known as: FOSAMAX Take 70 mg by mouth every Sunday.   apixaban 2.5 MG  Tabs tablet Commonly known as: ELIQUIS Take 1 tablet (2.5 mg total) by mouth 2 (two) times daily.   ciprofloxacin 250 MG tablet Commonly known as: CIPRO Take 250 mg by mouth 2 (two) times daily.   cycloSPORINE 0.05 % ophthalmic emulsion Commonly known as: RESTASIS Place one drop into both eyes 2 (two) times daily.   finasteride 5 MG tablet Commonly known as: PROSCAR Take 5 mg by mouth daily.   furosemide 40 MG tablet Commonly known as: LASIX Take 1 tablet (40 mg total) by mouth daily as needed for edema or fluid. Take 40 mg by mouth in the morning and an additional 40 mg once a day as needed for unresolved swelling or fluid retention What changed:  when to take this reasons to take this additional instructions   ipratropium 0.03 % nasal spray Commonly known as: ATROVENT Place 1 spray into both nostrils 3 (three) times daily as needed for rhinitis.   losartan 25 MG tablet Commonly known as: COZAAR TAKE ONE TABLET BY MOUTH ONE TIME DAILY   multivitamin with minerals Tabs tablet Take 1 tablet by mouth 2 (two) times a week.   nitroGLYCERIN 0.4 MG SL tablet Commonly known as: NITROSTAT Place 1 tablet (0.4 mg total) under the tongue every 5 (five) minutes as needed for chest pain.   oxyCODONE 5 MG immediate release tablet Commonly known as: Oxy IR/ROXICODONE Take 1 tablet (5 mg total)  by mouth every 6 (six) hours as needed for up to 5 days for moderate pain (pain score 4-6) or breakthrough pain.   pramipexole 1.5 MG tablet Commonly known as: MIRAPEX Take 1.5 mg by mouth at bedtime.   PreserVision AREDS Caps Take 1 capsule by mouth in the morning and at bedtime.   tamsulosin 0.4 MG Caps capsule Commonly known as: FLOMAX Take 0.4 mg by mouth daily.   temazepam 7.5 MG capsule Commonly known as: RESTORIL Take 7.5 mg by mouth at bedtime as needed for sleep (when not taking Trazodone).   traZODone 150 MG tablet Commonly known as: DESYREL Take 150 mg by mouth at bedtime as needed for sleep (when not taking Temazepam).         The results of significant diagnostics from this hospitalization (including imaging, microbiology, ancillary and laboratory) are listed below for reference.    Procedures and Diagnostic Studies:   CT ABDOMEN PELVIS W CONTRAST Result Date: 01/31/2023 CLINICAL DATA:  Abdominal distension, constipation EXAM: CT ABDOMEN AND PELVIS WITH CONTRAST TECHNIQUE: Multidetector CT imaging of the abdomen and pelvis was performed using the standard protocol following bolus administration of intravenous contrast. RADIATION DOSE REDUCTION: This exam was performed according to the departmental dose-optimization program which includes automated exposure control, adjustment of the mA and/or kV according to patient size and/or use of iterative reconstruction technique. CONTRAST:  65mL OMNIPAQUE IOHEXOL 350 MG/ML SOLN COMPARISON:  07/23/2017 FINDINGS: Lower chest: Small bilateral pleural effusions with bilateral lower lobe atelectasis, including rounded atelectasis in the left lower lobe. Hepatobiliary: Liver is within normal limits. Gallbladder is unremarkable. No intrahepatic or extrahepatic ductal dilatation. Pancreas: 2.3 cm septated cystic lesion in the pancreatic head (series 3/image 29), previously 2.2 cm. Side branch IPMN was favored on MR. CT appearance could  also suggest series cystadenoma. Regardless, this is essentially stable x5 years, benign. No follow-up is recommended given patient age greater than 81. Spleen: Within normal limits. Adrenals/Urinary Tract: Adrenal glands are within normal limits. Kidneys are within normal limits. Left renal vascular calcifications. No renal calculi or hydronephrosis.  Thick-walled, irregular bladder (series 3/image 67), suggesting cystitis. Stomach/Bowel: Stomach is within normal limits. No evidence of bowel obstruction. Appendix is not discretely visualized. Sigmoid diverticulosis, without evidence of diverticulitis. Vascular/Lymphatic: No evidence of abdominal aortic aneurysm. Atherosclerotic calcifications of the abdominal aorta and branch vessels, although vessels remain patent. No suspicious abdominopelvic lymphadenopathy. Reproductive: Prostatomegaly, suggesting BPH, with dystrophic calcifications. Other: No abdominopelvic ascites. Musculoskeletal: Degenerative changes of the visualized thoracolumbar spine. IMPRESSION: No evidence of bowel obstruction. Thick-walled, irregular bladder, suggesting cystitis. Correlate with urinalysis. Small bilateral pleural effusions with bilateral lower lobe atelectasis. Additional ancillary findings as above. Electronically Signed   By: Charline Bills M.D.   On: 01/31/2023 21:28   DG Pelvis 1-2 Views Result Date: 01/31/2023 CLINICAL DATA:  Fall with lower back pain EXAM: PELVIS - 1 VIEW COMPARISON:  07/23/2017 FINDINGS: No evidence of hip fracture or dislocation. No evidence of pelvic ring fracture or diastasis. Lower lumbar degenerative endplate spurring. IMPRESSION: No acute finding. Electronically Signed   By: Tiburcio Pea M.D.   On: 01/31/2023 09:50   CT Head Wo Contrast Result Date: 01/31/2023 CLINICAL DATA:  Right history: Head trauma, minor. Neck trauma. Additional history provided: Fall (with head trauma). On blood thinners. History of stroke. Low back pain. EXAM: CT HEAD  WITHOUT CONTRAST CT CERVICAL SPINE WITHOUT CONTRAST TECHNIQUE: Multidetector CT imaging of the head and cervical spine was performed following the standard protocol without intravenous contrast. Multiplanar CT image reconstructions of the cervical spine were also generated. RADIATION DOSE REDUCTION: This exam was performed according to the departmental dose-optimization program which includes automated exposure control, adjustment of the mA and/or kV according to patient size and/or use of iterative reconstruction technique. COMPARISON:  Report from head CT 04/27/2022.  Head 01/20/2015. FINDINGS: CT HEAD FINDINGS Mildly motion degraded exam. Within this limitation, findings are as follows. Brain: Cerebral atrophy. Mild-to-moderate dilation of the lateral and third ventricles, similar to the prior head CT of 04/27/2022. Acute callosal angle with some crowding of the sulci at the vertex. Patchy and ill-defined hypoattenuation within the cerebral white matter, nonspecific but compatible with moderate chronic small vessel ischemic disease. There is no acute intracranial hemorrhage. No demarcated cortical infarct. No extra-axial fluid collection. No evidence of an intracranial mass. No midline shift. Vascular: No hyperdense vessel.  Atherosclerotic calcifications. Skull: No calvarial fracture or aggressive osseous lesion. Sinuses/Orbits: No mass or acute finding within the imaged orbits. Other: Small-volume frothy secretions within the right frontal sinus. Mild mucosal thickening within the bilateral ethmoid, left sphenoid and bilateral maxillary sinuses. CT CERVICAL SPINE FINDINGS Alignment: Nonspecific straightening of the expected cervical lordosis. 2 mm C4-C5 grade 1 anterolisthesis. Skull base and vertebrae: The basion-dental and atlanto-dental intervals are maintained.No evidence of acute fracture to the cervical spine. Vertebral body and facet ankylosis at C3-C4. Facet ankylosis on the left at C6-C7. Soft tissues  and spinal canal: No prevertebral fluid or swelling. No visible canal hematoma. Disc levels: Cervical spondylosis with multilevel disc space narrowing, disc bulges/central disc protrusions, posterior disc osteophyte complexes, endplate spurring, uncovertebral hypertrophy and facet arthrosis. At the non-fused levels, disc space narrowing is greatest at C5-C6 and C6-C7 (advanced at these levels). At C5-C6, a posterior disc osteophyte complex contributes to up to moderate spinal canal stenosis. Multilevel bony neural foraminal narrowing. Multilevel ventral osteophytes, most prominent at C4-C5 and C5-C6. Upper chest: No consolidation within the imaged lung apices. No visible pneumothorax. IMPRESSION: CT head: 1. Mildly motion degraded exam. 2. No acute post-traumatic intracranial findings. 3. Moderate chronic small  vessel changes within the cerebral white matter. 4. Mild-to-moderate dilation of the lateral and third ventricles. This could reflect central predominant atrophy or, in the appropriate clinical setting, normal pressure hydrocephalus (NPH). 5. Paranasal sinus disease as described. CT cervical spine: 1. No evidence of an acute cervical spine fracture. 2. Nonspecific straightening of the expected cervical lordosis. 3. 2 mm C4-C5 grade 1 anterolisthesis. 4. Cervical spondylosis as described. 5. Multilevel vertebral ankylosis. Electronically Signed   By: Jackey Loge D.O.   On: 01/31/2023 09:40   CT Cervical Spine Wo Contrast Result Date: 01/31/2023 CLINICAL DATA:  Right history: Head trauma, minor. Neck trauma. Additional history provided: Fall (with head trauma). On blood thinners. History of stroke. Low back pain. EXAM: CT HEAD WITHOUT CONTRAST CT CERVICAL SPINE WITHOUT CONTRAST TECHNIQUE: Multidetector CT imaging of the head and cervical spine was performed following the standard protocol without intravenous contrast. Multiplanar CT image reconstructions of the cervical spine were also generated. RADIATION  DOSE REDUCTION: This exam was performed according to the departmental dose-optimization program which includes automated exposure control, adjustment of the mA and/or kV according to patient size and/or use of iterative reconstruction technique. COMPARISON:  Report from head CT 04/27/2022.  Head 01/20/2015. FINDINGS: CT HEAD FINDINGS Mildly motion degraded exam. Within this limitation, findings are as follows. Brain: Cerebral atrophy. Mild-to-moderate dilation of the lateral and third ventricles, similar to the prior head CT of 04/27/2022. Acute callosal angle with some crowding of the sulci at the vertex. Patchy and ill-defined hypoattenuation within the cerebral white matter, nonspecific but compatible with moderate chronic small vessel ischemic disease. There is no acute intracranial hemorrhage. No demarcated cortical infarct. No extra-axial fluid collection. No evidence of an intracranial mass. No midline shift. Vascular: No hyperdense vessel.  Atherosclerotic calcifications. Skull: No calvarial fracture or aggressive osseous lesion. Sinuses/Orbits: No mass or acute finding within the imaged orbits. Other: Small-volume frothy secretions within the right frontal sinus. Mild mucosal thickening within the bilateral ethmoid, left sphenoid and bilateral maxillary sinuses. CT CERVICAL SPINE FINDINGS Alignment: Nonspecific straightening of the expected cervical lordosis. 2 mm C4-C5 grade 1 anterolisthesis. Skull base and vertebrae: The basion-dental and atlanto-dental intervals are maintained.No evidence of acute fracture to the cervical spine. Vertebral body and facet ankylosis at C3-C4. Facet ankylosis on the left at C6-C7. Soft tissues and spinal canal: No prevertebral fluid or swelling. No visible canal hematoma. Disc levels: Cervical spondylosis with multilevel disc space narrowing, disc bulges/central disc protrusions, posterior disc osteophyte complexes, endplate spurring, uncovertebral hypertrophy and facet  arthrosis. At the non-fused levels, disc space narrowing is greatest at C5-C6 and C6-C7 (advanced at these levels). At C5-C6, a posterior disc osteophyte complex contributes to up to moderate spinal canal stenosis. Multilevel bony neural foraminal narrowing. Multilevel ventral osteophytes, most prominent at C4-C5 and C5-C6. Upper chest: No consolidation within the imaged lung apices. No visible pneumothorax. IMPRESSION: CT head: 1. Mildly motion degraded exam. 2. No acute post-traumatic intracranial findings. 3. Moderate chronic small vessel changes within the cerebral white matter. 4. Mild-to-moderate dilation of the lateral and third ventricles. This could reflect central predominant atrophy or, in the appropriate clinical setting, normal pressure hydrocephalus (NPH). 5. Paranasal sinus disease as described. CT cervical spine: 1. No evidence of an acute cervical spine fracture. 2. Nonspecific straightening of the expected cervical lordosis. 3. 2 mm C4-C5 grade 1 anterolisthesis. 4. Cervical spondylosis as described. 5. Multilevel vertebral ankylosis. Electronically Signed   By: Jackey Loge D.O.   On: 01/31/2023 09:40  CT Lumbar Spine Wo Contrast Result Date: 01/31/2023 CLINICAL DATA:  Back trauma, no prior imaging (Age >= 16y) EXAM: CT LUMBAR SPINE WITHOUT CONTRAST TECHNIQUE: Multidetector CT imaging of the lumbar spine was performed without intravenous contrast administration. Multiplanar CT image reconstructions were also generated. RADIATION DOSE REDUCTION: This exam was performed according to the departmental dose-optimization program which includes automated exposure control, adjustment of the mA and/or kV according to patient size and/or use of iterative reconstruction technique. COMPARISON:  None Available. FINDINGS: Segmentation: Lumbarization of S1 with last well-formed disc space labeled S1-S2 Alignment: Normal. Vertebrae: There is an acute nondisplaced fracture through the anterior superior  endplate of L5 on the left (series 6, image 41). There is likely also an additional nondisplaced fracture through the right lateral aspect of the L1 vertebral body (series 6, image 38) There are acute fractures through the bilateral sacral ala. There is also an age indeterminate fracture of the posterior elements of S3 (series 8, image 49). Paraspinal and other soft tissues: Aortic atherosclerotic calcifications. Right common iliac artery stent in place. Disc levels: There is severe spinal canal stenosis at L4-L5 and L5-S1 IMPRESSION: 1. Acute nondisplaced fracture through the anterior superior endplate of L5 on the left. 2. Likely additional nondisplaced fracture through the right lateral aspect of the L1 vertebral body. 3. Acute fractures through the bilateral sacral ala. 4. Age indeterminate fracture of the posterior elements of S3. 5. Severe spinal canal stenosis at L4-L5 and L5-S1. Consider further evaluation with a lumbar spine MRI. Aortic Atherosclerosis (ICD10-I70.0). Electronically Signed   By: Lorenza Cambridge M.D.   On: 01/31/2023 09:40   DG Chest Portable 1 View Result Date: 01/31/2023 CLINICAL DATA:  Fall EXAM: PORTABLE CHEST 1 VIEW COMPARISON:  04/27/2022 FINDINGS: Stable cardiomediastinal contours. Aortic atherosclerosis. No focal airspace consolidation, pleural effusion, or pneumothorax. IMPRESSION: No active disease. Electronically Signed   By: Duanne Guess D.O.   On: 01/31/2023 08:49     Labs:   Basic Metabolic Panel: Recent Labs  Lab 01/31/23 1219 02/01/23 1008 02/02/23 0547  NA 138 139 142  K 3.9 4.2 4.0  CL 109 108 106  CO2 22 21* 24  GLUCOSE 97 140* 117*  BUN 31* 21 21  CREATININE 1.31* 1.42* 1.46*  CALCIUM 8.6* 8.8* 9.0   GFR Estimated Creatinine Clearance: 36.3 mL/min (A) (by C-G formula based on SCr of 1.46 mg/dL (H)). Liver Function Tests: No results for input(s): "AST", "ALT", "ALKPHOS", "BILITOT", "PROT", "ALBUMIN" in the last 168 hours. No results for  input(s): "LIPASE", "AMYLASE" in the last 168 hours. No results for input(s): "AMMONIA" in the last 168 hours. Coagulation profile No results for input(s): "INR", "PROTIME" in the last 168 hours.  CBC: Recent Labs  Lab 01/31/23 1219 02/01/23 1008 02/02/23 0547  WBC 11.5* 14.5* 13.4*  NEUTROABS  --   --  11.3*  HGB 11.7* 12.4* 12.5*  HCT 36.8* 38.1* 38.8*  MCV 88.7 85.6 85.7  PLT 306 332 336   Cardiac Enzymes: No results for input(s): "CKTOTAL", "CKMB", "CKMBINDEX", "TROPONINI" in the last 168 hours. BNP: Invalid input(s): "POCBNP" CBG: No results for input(s): "GLUCAP" in the last 168 hours. D-Dimer No results for input(s): "DDIMER" in the last 72 hours. Hgb A1c No results for input(s): "HGBA1C" in the last 72 hours. Lipid Profile No results for input(s): "CHOL", "HDL", "LDLCALC", "TRIG", "CHOLHDL", "LDLDIRECT" in the last 72 hours. Thyroid function studies No results for input(s): "TSH", "T4TOTAL", "T3FREE", "THYROIDAB" in the last 72 hours.  Invalid input(s): "  FREET3" Anemia work up No results for input(s): "VITAMINB12", "FOLATE", "FERRITIN", "TIBC", "IRON", "RETICCTPCT" in the last 72 hours. Microbiology No results found for this or any previous visit (from the past 240 hours).  Time coordinating discharge: 45 minutes  Signed: Shary Lamos  [facility] 02/03/2023, 11:10 AM

## 2023-04-06 ENCOUNTER — Telehealth: Payer: Self-pay | Admitting: Internal Medicine

## 2023-04-06 DIAGNOSIS — I2510 Atherosclerotic heart disease of native coronary artery without angina pectoris: Secondary | ICD-10-CM

## 2023-04-06 MED ORDER — NITROGLYCERIN 0.4 MG SL SUBL: 0.4000 mg | SUBLINGUAL_TABLET | SUBLINGUAL | 1 refills | Status: DC | PRN

## 2023-04-06 NOTE — Telephone Encounter (Signed)
*  STAT* If patient is at the pharmacy, call can be transferred to refill team.   1. Which medications need to be refilled? (please list name of each medication and dose if known)   nitroGLYCERIN (NITROSTAT) 0.4 MG SL tablet     2. Would you like to learn more about the convenience, safety, & potential cost savings by using the [facility]? No   3. Are you open to using the [facility] (Type [facility].) No   4. Which pharmacy/location (including street and city if local pharmacy) is medication to be sent to? [facility] [address] [place], [place] - [address] AT [facility] [address]    5. Do they need a 30 day or 90 day supply? 90 day

## 2023-04-19 ENCOUNTER — Ambulatory Visit: Payer: Medicare HMO | Attending: Physician Assistant | Admitting: Physician Assistant

## 2023-04-19 ENCOUNTER — Encounter: Payer: Self-pay | Admitting: Physician Assistant

## 2023-04-19 VITALS — BP 108/60 | HR 73 | Ht 69.0 in | Wt 168.6 lb

## 2023-04-19 DIAGNOSIS — I2510 Atherosclerotic heart disease of native coronary artery without angina pectoris: Secondary | ICD-10-CM | POA: Diagnosis not present

## 2023-04-19 DIAGNOSIS — R600 Localized edema: Secondary | ICD-10-CM

## 2023-04-19 DIAGNOSIS — I712 Thoracic aortic aneurysm, without rupture, unspecified: Secondary | ICD-10-CM

## 2023-04-19 DIAGNOSIS — I739 Peripheral vascular disease, unspecified: Secondary | ICD-10-CM

## 2023-04-19 DIAGNOSIS — I10 Essential (primary) hypertension: Secondary | ICD-10-CM

## 2023-04-19 DIAGNOSIS — I4819 Other persistent atrial fibrillation: Secondary | ICD-10-CM | POA: Diagnosis not present

## 2023-04-19 MED ORDER — FUROSEMIDE 40 MG PO TABS: ORAL_TABLET | ORAL | Status: DC

## 2023-04-19 NOTE — Progress Notes (Unsigned)
 Cardiology Office Note:  .   Date:  04/19/2023  ID:  Carl Harper, DOB 1934-07-24, MRN [id number] PCP: Tracey Harries, MD  [facility] Providers Cardiologist:  Chrystie Nose, MD { Click to update primary MD,subspecialty MD or APP then REFRESH:1}   History of Present Illness: .   Carl Harper is a 89 y.o. male with PMH of PAD, CAD, TAA, persistent atrial fibrillation, hyperlipidemia, hypertension, RLS, OSA and TIA.  Previous cardiac catheterization in 2001 showed mild nonobstructive CAD.  Myoview in 2011 was normal.  He underwent atherectomy by Dr. Allyson Sabal in 2013 with stent placed in calcified right common iliac artery. He was diagnosed with atrial flutter/atrial fibrillation in 2016 and was referred to Dr. Elberta Fortis.  Atrial flutter was managed using amiodarone therapy and anticoagulation therapy.  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  He is on mexiletine for frequent PVCs. Echocardiogram obtained on 05/09/2018 showed EF 55 to 60%, moderate to severe dilatation of the ascending aorta measuring at 50 mm. CT angiogram of the chest obtained on 06/10/2019 showed a stable ascending thoracic aorta measuring at 49 mm in diameter.  Myoview in May 2021 was low risk, negative for ischemia.  Last echocardiogram obtained on 06/01/2020 showed EF 60 to 65%, grade 1 DD, RVSP 21.2 mmHg, trivial MR, mild to moderate aortic atherosclerosis, dilated aortic root measuring at 40 mm, severe dilatation of the ascending aorta measuring at 50 mm.  Beta-blocker was discontinued in April 2024 as he was orthostatic at the time.  Diuretic was increased follow extremity edema, however did not improve the degree of the lower extremity edema.  BNP was normal.  He was last seen by Dr. Rennis Golden in July 2024, at which time he was in rate controlled atrial flutter.  Dr. He was hesitant to go up on the diuretic any further.  It was also mentioned that we will discontinue surveillance of the thoracic aortic aneurysm as he  did not wish to consider aortic surgery.  Patient presents today for follow-up.  He denies any chest pain or shortness of breath.  On exam, he has 2+ pitting edema which is chronic for him.  He is currently on 1.5 tablet of 40 mg Lasix at home, therefore takes 60 mg daily.  I decided to increase his Lasix to 40 mg twice a day for 3 days before going back to 60 mg daily thereafter.  He had a total of 12 falls in the past year, it appears his Eliquis has been reduced to 2.5 mg twice a day instead of the previous 5 mg twice a day.  He is no longer on mexiletine as mentioned in the previous note.  His creatinine is borderline at 1.46, age is at the 67, weight 76 kg, with his high bleeding risk and fall risk, I decided to leave him on 2.5 mg twice a day of Eliquis.  I asked him if his last fall in December was due to dizziness or mechanical fall, he says he got up at night and fell.  He is not sure however felt it may be related to dizziness.  If symptoms persist, I may stop his losartan to give him a little more blood pressure room back.  I recommended continue on the rest of the medication and follow-up in 6 months.  He has skin cancer and has wound in the lower extremity, therefore I cannot place him on compression stocking.  He says he has tried compression stocking in the past  which did not work for him.  ROS: ***  Studies Reviewed: .        *** Risk Assessment/Calculations:   {Does this patient have ATRIAL FIBRILLATION?:[phone]}         Physical Exam:   VS:  BP 108/60 (BP Location: Left Arm, Patient Position: Sitting, Cuff Size: Normal)   Pulse 73   Ht 5\' 9"  (1.753 m)   Wt 168 lb 9.6 oz (76.5 kg)   SpO2 98%   BMI 24.90 kg/m    Wt Readings from Last 3 Encounters:  04/19/23 168 lb 9.6 oz (76.5 kg)  01/31/23 179 lb 4.8 oz (81.3 kg)  09/14/22 181 lb 12.8 oz (82.5 kg)    GEN: Well nourished, well developed in no acute distress NECK: No JVD; No carotid bruits CARDIAC: ***RRR, no murmurs,  rubs, gallops RESPIRATORY:  Clear to auscultation without rales, wheezing or rhonchi  ABDOMEN: Soft, non-tender, non-distended EXTREMITIES:  No edema; No deformity   ASSESSMENT AND PLAN: .   ***    {Are you ordering a CV Procedure (e.g. stress test, cath, DCCV, TEE, etc)?   Press F2        :[id number]}  Dispo: ***  Signed, Azalee Course, PA

## 2023-04-19 NOTE — Patient Instructions (Signed)
 Medication Instructions:  INCREASE LASIX TO 40 MG TWICE DAILY FOR 3 DAYS THEN RESUME BACK TO 60 MG DAILY  *If you need a refill on your cardiac medications before your next appointment, please call your pharmacy*   Lab Work: NO LABS If you have labs (blood work) drawn today and your tests are completely normal, you will receive your results only by: MyChart Message (if you have MyChart) OR A paper copy in the mail If you have any lab test that is abnormal or we need to change your treatment, we will call you to review the results.   Testing/Procedures: NO TESTING   Follow-Up: At [facility], you and your health needs are our priority.  As part of our continuing mission to provide you with exceptional heart care, we have created designated Provider Care Teams.  These Care Teams include your primary Cardiologist (physician) and Advanced Practice Providers (APPs -  Physician Assistants and Nurse Practitioners) who all work together to provide you with the care you need, when you need it.  Your next appointment:   6 month(s)  Provider:   Chrystie Nose, MD

## 2023-05-17 ENCOUNTER — Other Ambulatory Visit: Payer: Self-pay

## 2023-05-17 MED ORDER — FUROSEMIDE 40 MG PO TABS
60.0000 mg | ORAL_TABLET | Freq: Every day | ORAL | 3 refills | Status: DC
Start: 2023-05-17 — End: 2023-10-17

## 2023-06-22 ENCOUNTER — Encounter (HOSPITAL_BASED_OUTPATIENT_CLINIC_OR_DEPARTMENT_OTHER): Attending: General Surgery | Admitting: General Surgery

## 2023-06-22 DIAGNOSIS — L97822 Non-pressure chronic ulcer of other part of left lower leg with fat layer exposed: Secondary | ICD-10-CM | POA: Diagnosis present

## 2023-06-22 DIAGNOSIS — R600 Localized edema: Secondary | ICD-10-CM | POA: Insufficient documentation

## 2023-06-22 DIAGNOSIS — I739 Peripheral vascular disease, unspecified: Secondary | ICD-10-CM | POA: Diagnosis not present

## 2023-06-22 DIAGNOSIS — N183 Chronic kidney disease, stage 3 unspecified: Secondary | ICD-10-CM | POA: Insufficient documentation

## 2023-06-22 DIAGNOSIS — L84 Corns and callosities: Secondary | ICD-10-CM | POA: Diagnosis not present

## 2023-07-02 ENCOUNTER — Encounter (HOSPITAL_BASED_OUTPATIENT_CLINIC_OR_DEPARTMENT_OTHER): Admitting: Internal Medicine

## 2023-07-02 ENCOUNTER — Other Ambulatory Visit: Payer: Self-pay | Admitting: Internal Medicine

## 2023-07-03 NOTE — Telephone Encounter (Signed)
 Prescription refill request for Eliquis  received. Indication:afib Last office visit:2/25 Scr:1.46  12/24 Age: 89 Weight:76.5  kg  Prescription refilled

## 2023-07-10 ENCOUNTER — Encounter (HOSPITAL_BASED_OUTPATIENT_CLINIC_OR_DEPARTMENT_OTHER): Admitting: General Surgery

## 2023-07-10 DIAGNOSIS — L97822 Non-pressure chronic ulcer of other part of left lower leg with fat layer exposed: Secondary | ICD-10-CM | POA: Diagnosis not present

## 2023-07-23 ENCOUNTER — Ambulatory Visit (HOSPITAL_BASED_OUTPATIENT_CLINIC_OR_DEPARTMENT_OTHER): Admitting: General Surgery

## 2023-08-13 ENCOUNTER — Encounter (HOSPITAL_BASED_OUTPATIENT_CLINIC_OR_DEPARTMENT_OTHER): Attending: General Surgery | Admitting: General Surgery

## 2023-08-13 DIAGNOSIS — L97822 Non-pressure chronic ulcer of other part of left lower leg with fat layer exposed: Secondary | ICD-10-CM | POA: Insufficient documentation

## 2023-08-13 DIAGNOSIS — N183 Chronic kidney disease, stage 3 unspecified: Secondary | ICD-10-CM | POA: Insufficient documentation

## 2023-08-13 DIAGNOSIS — I739 Peripheral vascular disease, unspecified: Secondary | ICD-10-CM | POA: Diagnosis not present

## 2023-08-13 DIAGNOSIS — R600 Localized edema: Secondary | ICD-10-CM | POA: Insufficient documentation

## 2023-08-13 DIAGNOSIS — L84 Corns and callosities: Secondary | ICD-10-CM | POA: Diagnosis not present

## 2023-08-20 ENCOUNTER — Encounter (HOSPITAL_BASED_OUTPATIENT_CLINIC_OR_DEPARTMENT_OTHER): Admitting: General Surgery

## 2023-08-20 DIAGNOSIS — L97822 Non-pressure chronic ulcer of other part of left lower leg with fat layer exposed: Secondary | ICD-10-CM | POA: Diagnosis not present

## 2023-08-27 ENCOUNTER — Encounter (HOSPITAL_BASED_OUTPATIENT_CLINIC_OR_DEPARTMENT_OTHER): Attending: General Surgery | Admitting: General Surgery

## 2023-08-27 DIAGNOSIS — I2510 Atherosclerotic heart disease of native coronary artery without angina pectoris: Secondary | ICD-10-CM | POA: Insufficient documentation

## 2023-08-27 DIAGNOSIS — I739 Peripheral vascular disease, unspecified: Secondary | ICD-10-CM | POA: Diagnosis not present

## 2023-08-27 DIAGNOSIS — Z85828 Personal history of other malignant neoplasm of skin: Secondary | ICD-10-CM | POA: Insufficient documentation

## 2023-08-27 DIAGNOSIS — L97822 Non-pressure chronic ulcer of other part of left lower leg with fat layer exposed: Secondary | ICD-10-CM | POA: Insufficient documentation

## 2023-08-27 DIAGNOSIS — Z87891 Personal history of nicotine dependence: Secondary | ICD-10-CM | POA: Diagnosis not present

## 2023-08-27 DIAGNOSIS — I700 Atherosclerosis of aorta: Secondary | ICD-10-CM | POA: Diagnosis not present

## 2023-08-27 DIAGNOSIS — L84 Corns and callosities: Secondary | ICD-10-CM | POA: Diagnosis not present

## 2023-08-27 DIAGNOSIS — R600 Localized edema: Secondary | ICD-10-CM | POA: Insufficient documentation

## 2023-08-27 DIAGNOSIS — N183 Chronic kidney disease, stage 3 unspecified: Secondary | ICD-10-CM | POA: Diagnosis not present

## 2023-09-03 ENCOUNTER — Encounter (HOSPITAL_BASED_OUTPATIENT_CLINIC_OR_DEPARTMENT_OTHER): Admitting: Internal Medicine

## 2023-09-03 DIAGNOSIS — L97822 Non-pressure chronic ulcer of other part of left lower leg with fat layer exposed: Secondary | ICD-10-CM | POA: Diagnosis not present

## 2023-09-10 ENCOUNTER — Encounter (HOSPITAL_BASED_OUTPATIENT_CLINIC_OR_DEPARTMENT_OTHER): Admitting: General Surgery

## 2023-09-10 DIAGNOSIS — L97822 Non-pressure chronic ulcer of other part of left lower leg with fat layer exposed: Secondary | ICD-10-CM | POA: Diagnosis not present

## 2023-09-27 ENCOUNTER — Other Ambulatory Visit: Payer: Self-pay

## 2023-09-27 ENCOUNTER — Other Ambulatory Visit (HOSPITAL_COMMUNITY): Payer: Self-pay

## 2023-09-27 DIAGNOSIS — I2510 Atherosclerotic heart disease of native coronary artery without angina pectoris: Secondary | ICD-10-CM

## 2023-09-27 MED ORDER — NITROGLYCERIN 0.4 MG SL SUBL
0.4000 mg | SUBLINGUAL_TABLET | SUBLINGUAL | 1 refills | Status: DC | PRN
  Filled 2023-09-27: qty 25, 8d supply, fill #0

## 2023-10-04 ENCOUNTER — Other Ambulatory Visit: Payer: Self-pay | Admitting: Internal Medicine

## 2023-10-04 NOTE — Telephone Encounter (Signed)
*  STAT* If patient is at the pharmacy, call can be transferred to refill team.   1. Which medications need to be refilled? (please list name of each medication and dose if known) ELIQUIS  2.5 MG TABS tablet    2. Would you like to learn more about the convenience, safety, & potential cost savings by using the [facility]? No   3. Are you open to using the [facility] (Type [facility]. ). No   4. Which pharmacy/location (including street and city if local pharmacy) is medication to be sent to? [facility] [address] [place], [place] - [address] AT [facility] [address]     5. Do they need a 30 day or 90 day supply? 90 day   Pt is completely out of medication  Pt has appt scheduled for 10/20

## 2023-10-05 ENCOUNTER — Other Ambulatory Visit: Payer: Self-pay

## 2023-10-05 MED ORDER — APIXABAN 5 MG PO TABS: 5.0000 mg | ORAL_TABLET | Freq: Two times a day (BID) | ORAL | 5 refills | Status: AC

## 2023-10-05 NOTE — Telephone Encounter (Signed)
 Prescription refill request for Eliquis  received. Indication: a fib Last office visit: 04/19/23 Scr: 1.38 [facility] 05/28/23 Age: 89 Weight: 76kg  Please change Rx to 5mg  BID

## 2023-10-16 ENCOUNTER — Other Ambulatory Visit: Payer: Self-pay | Admitting: Physician Assistant

## 2023-12-10 ENCOUNTER — Ambulatory Visit: Admitting: Internal Medicine

## 2024-02-01 ENCOUNTER — Ambulatory Visit: Admitting: Internal Medicine

## 2024-02-27 ENCOUNTER — Telehealth: Payer: Self-pay | Admitting: Physician Assistant

## 2024-02-27 DIAGNOSIS — I2510 Atherosclerotic heart disease of native coronary artery without angina pectoris: Secondary | ICD-10-CM

## 2024-02-27 MED ORDER — NITROGLYCERIN 0.4 MG SL SUBL: 0.4000 mg | SUBLINGUAL_TABLET | SUBLINGUAL | 0 refills | Status: AC | PRN

## 2024-02-27 NOTE — Telephone Encounter (Signed)
" °*  STAT* If patient is at the pharmacy, call can be transferred to refill team.   1. Which medications need to be refilled? (please list name of each medication and dose if known)   nitroGLYCERIN  (NITROSTAT ) 0.4 MG SL tablet     2. Would you like to learn more about the convenience, safety, & potential cost savings by using the [facility]? No    3. Are you open to using the [facility] (Type [facility]. No    4. Which pharmacy/location (including street and city if local pharmacy) is medication to be sent to?[facility] [address] [place], [place] - [address] AT [facility] [address]    5. Do they need a 30 day or 90 day supply?   "

## 2024-02-28 NOTE — Telephone Encounter (Signed)
 Pt's medication was sent to pt's pharmacy as requested. Confirmation received.

## 2024-04-21 ENCOUNTER — Ambulatory Visit: Admitting: Internal Medicine
